# Patient Record
Sex: Male | Born: 1955 | Race: Black or African American | Hispanic: No | Smoking: Never smoker
Health system: Southern US, Community
[De-identification: ages and names within clinical notes are randomized; demographics above are authoritative.]

## PROBLEM LIST (undated history)

## (undated) DIAGNOSIS — I10 Essential (primary) hypertension: Secondary | ICD-10-CM

## (undated) DIAGNOSIS — E785 Hyperlipidemia, unspecified: Secondary | ICD-10-CM

## (undated) DIAGNOSIS — M1990 Unspecified osteoarthritis, unspecified site: Secondary | ICD-10-CM

## (undated) DIAGNOSIS — M109 Gout, unspecified: Secondary | ICD-10-CM

## (undated) DIAGNOSIS — K635 Polyp of colon: Secondary | ICD-10-CM

## (undated) HISTORY — DX: Hyperlipidemia, unspecified: E78.5

## (undated) HISTORY — PX: COLONOSCOPY: SHX174

## (undated) HISTORY — DX: Gout, unspecified: M10.9

## (undated) HISTORY — PX: WISDOM TOOTH EXTRACTION: SHX21

## (undated) HISTORY — DX: Essential (primary) hypertension: I10

## (undated) HISTORY — DX: Polyp of colon: K63.5

## (undated) HISTORY — DX: Unspecified osteoarthritis, unspecified site: M19.90

---

## 1998-08-02 HISTORY — PX: FRACTURE SURGERY: SHX138

## 2002-09-20 ENCOUNTER — Ambulatory Visit (HOSPITAL_BASED_OUTPATIENT_CLINIC_OR_DEPARTMENT_OTHER): Admission: RE | Admit: 2002-09-20 | Discharge: 2002-09-20 | Payer: Self-pay | Admitting: Orthopedic Surgery

## 2008-01-22 ENCOUNTER — Ambulatory Visit: Payer: Self-pay | Admitting: Gastroenterology

## 2008-02-05 ENCOUNTER — Ambulatory Visit: Payer: Self-pay | Admitting: Gastroenterology

## 2008-02-05 ENCOUNTER — Telehealth (INDEPENDENT_AMBULATORY_CARE_PROVIDER_SITE_OTHER): Payer: Self-pay | Admitting: *Deleted

## 2010-12-18 NOTE — Op Note (Signed)
[name], Daniel Davis NO.:  [id number]   MEDICAL RECORD NO.:  [id number]                   PATIENT TYPE:  AMB   LOCATION:  DSC                                  FACILITY:  [facility]   PHYSICIAN:  Leonides Grills, M.D.                  DATE OF BIRTH:  05-15-1956   DATE OF PROCEDURE:  09/20/2002  DATE OF DISCHARGE:                                 OPERATIVE REPORT   PREOPERATIVE DIAGNOSIS:  Left lateral malleolus fracture.   POSTOPERATIVE DIAGNOSIS:  Left lateral malleolus fracture.   PROCEDURE:  Open reduction and internal fixation of left _________  equivalent lateral malleolus fracture.   ANESTHESIA:  General endotracheal tube.   SURGEON:  Leonides Grills, M.D.   ASSISTANT:  Lianne Cure, P.A.   ESTIMATED BLOOD LOSS:  Minimal.   TOURNIQUET TIME:  Approximately one-half hour.   COMPLICATIONS:  None.   DISPOSITION:  Stable to PAR.   INDICATIONS:  This is a 55 year old male who slipped and fell and sustained  the above injury.  He was consented for the above procedure.  All risks,  which include infection, nerve or vessel injury, nonunion, malunion,  hardware rotation, hardware failure, stiffness, arthritis, were all  explained and questions encouraged and answered.   DESCRIPTION OF PROCEDURE:  The patient was brought to the operating room and  placed in the supine position after adequate general endotracheal tube  anesthesia was administered as well as Ancef 1 g IV piggyback.  The left  lower extremity was then prepped and draped in a sterile manner over a  proximally-placed thigh tourniquet with a bump placed under the ipsilateral  hip.  The limb was gravity-exsanguinated.  The tourniquet was elevated to  290 mmHg and a longitudinal incision over the lateral malleolus was then  made.  Dissection was carried down __________ the fracture site was  encountered.  Soft tissue was then removed from the fracture site.  A five-  hole, one-third tubular  plate was then chosen.  The fracture was then  reduced and the plate was applied in the anti-glide manner.  Set screws were  then placed proximal to the fracture site through the plate, and these were  3.5 mm fully-threaded cortical screws using a 2.5 mm drill.  The most distal  set screw was then placed through the plate into the distal fragment and a  lag screw was then placed across the second to the most distal hole across  the fracture site.  All screws had excellent purchase.  Fracture was  maintained in an anatomic position.  X-rays were then obtained in AP,  lateral, and mortise views, and this showed an anatomic reduction with  reduction of the talus and the ankle mortise.  The tourniquet was deflated, hemostasis was maintained.  The wound was  copiously irrigated with normal saline.  The subcu was closed with 3-0  Vicryl, the skin was closed with 4-0 nylon.  A sterile dressing was applied.  A modified Jones dressing was applied with the ankle in neutral  dorsiflexion.  The patient was stable to the PAR.                                                 Leonides Grills, M.D.    PB/MEDQ  D:  09/20/2002  T:  09/20/2002  Job:  [id number]

## 2011-11-02 ENCOUNTER — Other Ambulatory Visit: Payer: Self-pay | Admitting: Internal Medicine

## 2011-11-27 ENCOUNTER — Ambulatory Visit (INDEPENDENT_AMBULATORY_CARE_PROVIDER_SITE_OTHER): Payer: BC Managed Care – PPO | Admitting: Internal Medicine

## 2011-11-27 ENCOUNTER — Ambulatory Visit: Payer: BC Managed Care – PPO

## 2011-11-27 VITALS — BP 126/84 | HR 83 | Temp 98.5°F | Resp 16 | Ht 70.58 in | Wt 222.2 lb

## 2011-11-27 DIAGNOSIS — M25569 Pain in unspecified knee: Secondary | ICD-10-CM

## 2011-11-27 DIAGNOSIS — I10 Essential (primary) hypertension: Secondary | ICD-10-CM

## 2011-11-27 DIAGNOSIS — R609 Edema, unspecified: Secondary | ICD-10-CM

## 2011-11-27 DIAGNOSIS — M109 Gout, unspecified: Secondary | ICD-10-CM

## 2011-11-27 DIAGNOSIS — E789 Disorder of lipoprotein metabolism, unspecified: Secondary | ICD-10-CM

## 2011-11-27 LAB — POCT CBC
Granulocyte percent: 60.6 %G (ref 37–80)
MID (cbc): 0.6 (ref 0–0.9)
MPV: 8.5 fL (ref 0–99.8)
POC Granulocyte: 5 (ref 2–6.9)
POC MID %: 7.5 %M (ref 0–12)
Platelet Count, POC: 347 10*3/uL (ref 142–424)
RBC: 5.1 M/uL (ref 4.69–6.13)

## 2011-11-27 LAB — COMPREHENSIVE METABOLIC PANEL
ALT: 21 U/L (ref 0–53)
Albumin: 4.7 g/dL (ref 3.5–5.2)
Alkaline Phosphatase: 61 U/L (ref 39–117)
Potassium: 4.7 mEq/L (ref 3.5–5.3)
Sodium: 134 mEq/L — ABNORMAL LOW (ref 135–145)
Total Bilirubin: 0.3 mg/dL (ref 0.3–1.2)
Total Protein: 7.4 g/dL (ref 6.0–8.3)

## 2011-11-27 MED ORDER — IBUPROFEN 800 MG PO TABS: 800.0000 mg | ORAL_TABLET | Freq: Three times a day (TID) | ORAL | Status: AC | PRN

## 2011-11-27 NOTE — Patient Instructions (Signed)
Gout Gout is an inflammatory condition (arthritis) caused by a buildup of uric acid crystals in the joints. Uric acid is a chemical that is normally present in the blood. Under some circumstances, uric acid can form into crystals in your joints. This causes joint redness, soreness, and swelling (inflammation). Repeat attacks are common. Over time, uric acid crystals can form into masses (tophi) near a joint, causing disfigurement. Gout is treatable and often preventable. CAUSES  The disease begins with elevated levels of uric acid in the blood. Uric acid is produced by your body when it breaks down a naturally found substance called purines. This also happens when you eat certain foods such as meats and fish. Causes of an elevated uric acid level include:  Being passed down from parent to child (heredity).   Diseases that cause increased uric acid production (obesity, psoriasis, some cancers).   Excessive alcohol use.   Diet, especially diets rich in meat and seafood.   Medicines, including certain cancer-fighting drugs (chemotherapy), diuretics, and aspirin.   Chronic kidney disease. The kidneys are no longer able to remove uric acid well.   Problems with metabolism.  Conditions strongly associated with gout include:  Obesity.   High blood pressure.   High cholesterol.   Diabetes.  Not everyone with elevated uric acid levels gets gout. It is not understood why some people get gout and others do not. Surgery, joint injury, and eating too much of certain foods are some of the factors that can lead to gout. SYMPTOMS   An attack of gout comes on quickly. It causes intense pain with redness, swelling, and warmth in a joint.   Fever can occur.   Often, only one joint is involved. Certain joints are more commonly involved:   Base of the big toe.   Knee.   Ankle.   Wrist.   Finger.  Without treatment, an attack usually goes away in a few days to weeks. Between attacks, you  usually will not have symptoms, which is different from many other forms of arthritis. DIAGNOSIS  Your caregiver will suspect gout based on your symptoms and exam. Removal of fluid from the joint (arthrocentesis) is done to check for uric acid crystals. Your caregiver will give you a medicine that numbs the area (local anesthetic) and use a needle to remove joint fluid for exam. Gout is confirmed when uric acid crystals are seen in joint fluid, using a special microscope. Sometimes, blood, urine, and X-ray tests are also used. TREATMENT  There are 2 phases to gout treatment: treating the sudden onset (acute) attack and preventing attacks (prophylaxis). Treatment of an Acute Attack  Medicines are used. These include anti-inflammatory medicines or steroid medicines.   An injection of steroid medicine into the affected joint is sometimes necessary.   The painful joint is rested. Movement can worsen the arthritis.   You may use warm or cold treatments on painful joints, depending which works best for you.   Discuss the use of coffee, vitamin C, or cherries with your caregiver. These may be helpful treatment options.  Treatment to Prevent Attacks After the acute attack subsides, your caregiver may advise prophylactic medicine. These medicines either help your kidneys eliminate uric acid from your body or decrease your uric acid production. You may need to stay on these medicines for a very long time. The early phase of treatment with prophylactic medicine can be associated with an increase in acute gout attacks. For this reason, during the first few months   of treatment, your caregiver may also advise you to take medicines usually used for acute gout treatment. Be sure you understand your caregiver's directions. You should also discuss dietary treatment with your caregiver. Certain foods such as meats and fish can increase uric acid levels. Other foods such as dairy can decrease levels. Your caregiver  can give you a list of foods to avoid. HOME CARE INSTRUCTIONS   Do not take aspirin to relieve pain. This raises uric acid levels.   Only take over-the-counter or prescription medicines for pain, discomfort, or fever as directed by your caregiver.   Rest the joint as much as possible. When in bed, keep sheets and blankets off painful areas.   Keep the affected joint raised (elevated).   Use crutches if the painful joint is in your leg.   Drink enough water and fluids to keep your urine clear or pale yellow. This helps your body get rid of uric acid. Do not drink alcoholic beverages. They slow the passage of uric acid.   Follow your caregiver's dietary instructions. Pay careful attention to the amount of protein you eat. Your daily diet should emphasize fruits, vegetables, whole grains, and fat-free or low-fat milk products.   Maintain a healthy body weight.  SEEK MEDICAL CARE IF:   You have an oral temperature above 102 F (38.9 C).   You develop diarrhea, vomiting, or any side effects from medicines.   You do not feel better in 24 hours, or you are getting worse.  SEEK IMMEDIATE MEDICAL CARE IF:   Your joint becomes suddenly more tender and you have:   Chills.   An oral temperature above 102 F (38.9 C), not controlled by medicine.  MAKE SURE YOU:   Understand these instructions.   Will watch your condition.   Will get help right away if you are not doing well or get worse.  Document Released: 07/16/2000 Document Revised: 07/08/2011 Document Reviewed: 10/27/2009 [employer] Patient Information 2012 [employer]. 

## 2011-11-27 NOTE — Progress Notes (Signed)
  Subjective:    Patient ID: Daniel Davis, male    DOB: 1955/08/07, 56 y.o.   MRN: [id number]  HPI Has acute R knee pain for few days and thinks its gout. Taking htn and lipid meds. Feels good working and playing ball, knee swelled and later started hurting.   Review of Systems     Objective:   Physical Exam R knee XR [facility] reading (PRIMARY) by  Dr. Perrin Maltese Normal xr Knee mildly tender with effusion and warmth but no reddness . Feels good otherwise.         Assessment & Plan:  Probable gout  Motrin 800mg  till well Hinged knee brace

## 2011-12-02 ENCOUNTER — Other Ambulatory Visit: Payer: Self-pay | Admitting: Internal Medicine

## 2012-02-14 ENCOUNTER — Encounter: Payer: Self-pay | Admitting: Internal Medicine

## 2012-02-28 ENCOUNTER — Encounter: Payer: Self-pay | Admitting: Internal Medicine

## 2012-02-28 ENCOUNTER — Ambulatory Visit (INDEPENDENT_AMBULATORY_CARE_PROVIDER_SITE_OTHER): Payer: BC Managed Care – PPO | Admitting: Internal Medicine

## 2012-02-28 VITALS — BP 132/90 | HR 83 | Temp 99.0°F | Resp 16 | Ht 70.0 in | Wt 221.0 lb

## 2012-02-28 DIAGNOSIS — E782 Mixed hyperlipidemia: Secondary | ICD-10-CM

## 2012-02-28 DIAGNOSIS — Z201 Contact with and (suspected) exposure to tuberculosis: Secondary | ICD-10-CM

## 2012-02-28 DIAGNOSIS — I10 Essential (primary) hypertension: Secondary | ICD-10-CM

## 2012-02-28 DIAGNOSIS — M1990 Unspecified osteoarthritis, unspecified site: Secondary | ICD-10-CM

## 2012-02-28 DIAGNOSIS — Z0000 Encounter for general adult medical examination without abnormal findings: Secondary | ICD-10-CM

## 2012-02-28 LAB — COMPREHENSIVE METABOLIC PANEL
ALT: 16 U/L (ref 0–53)
AST: 20 U/L (ref 0–37)
Albumin: 4.1 g/dL (ref 3.5–5.2)
BUN: 18 mg/dL (ref 6–23)
CO2: 23 mEq/L (ref 19–32)
Calcium: 9.8 mg/dL (ref 8.4–10.5)
Chloride: 101 mEq/L (ref 96–112)
Potassium: 4.7 mEq/L (ref 3.5–5.3)

## 2012-02-28 LAB — CBC WITH DIFFERENTIAL/PLATELET
Basophils Absolute: 0 10*3/uL (ref 0.0–0.1)
HCT: 37.9 % — ABNORMAL LOW (ref 39.0–52.0)
Hemoglobin: 13.1 g/dL (ref 13.0–17.0)
Lymphocytes Relative: 28 % (ref 12–46)
Lymphs Abs: 1.6 10*3/uL (ref 0.7–4.0)
Monocytes Absolute: 0.5 10*3/uL (ref 0.1–1.0)
Monocytes Relative: 9 % (ref 3–12)
Neutro Abs: 3.5 10*3/uL (ref 1.7–7.7)
RBC: 4.83 MIL/uL (ref 4.22–5.81)
RDW: 14.2 % (ref 11.5–15.5)
WBC: 5.8 10*3/uL (ref 4.0–10.5)

## 2012-02-28 LAB — POCT URINALYSIS DIPSTICK
Glucose, UA: NEGATIVE
Ketones, UA: NEGATIVE
Spec Grav, UA: >=1.03

## 2012-02-28 LAB — POCT UA - MICROSCOPIC ONLY: Bacteria, U Microscopic: NEGATIVE

## 2012-02-28 LAB — TSH: TSH: 0.938 u[IU]/mL (ref 0.350–4.500)

## 2012-02-28 LAB — LIPID PANEL: HDL: 36 mg/dL — ABNORMAL LOW (ref >39)

## 2012-02-28 LAB — IFOBT (OCCULT BLOOD): IFOBT: POSITIVE

## 2012-02-28 LAB — PSA: PSA: 1.49 ng/mL (ref <=4.00)

## 2012-02-28 NOTE — Progress Notes (Signed)
  Subjective:    Patient ID: Daniel Davis, male    DOB: 01-16-1956, 56 y.o.   MRN: [id number]  HPI Taking care of disabled mother and his own family. HTN, lipids controlled. Feels good  See scanned hx  Review of Systems See scanned ros    Objective:   Physical Exam  Constitutional: He is oriented to person, place, and time. He appears well-developed and well-nourished.  HENT:  Right Ear: External ear normal.  Left Ear: External ear normal.  Nose: Nose normal.  Mouth/Throat: Oropharynx is clear and moist.  Eyes: EOM are normal. Pupils are equal, round, and reactive to light.  Neck: Normal range of motion. No tracheal deviation present. No thyromegaly present.  Cardiovascular: Normal rate, regular rhythm and normal heart sounds.   Pulmonary/Chest: Effort normal and breath sounds normal.  Abdominal: Soft. He exhibits no mass. There is no tenderness.  Genitourinary: Rectum normal and prostate normal. No penile tenderness.  Musculoskeletal: Normal range of motion. He exhibits edema and tenderness.  Lymphadenopathy:    He has no cervical adenopathy.  Neurological: He is alert and oriented to person, place, and time. He has normal reflexes. He exhibits normal muscle tone. Coordination normal.  Skin: Skin is warm and dry.  Psychiatric: He has a normal mood and affect.     ekg nl     Assessment & Plan:  RF meds 1 yr Doing well

## 2012-03-02 ENCOUNTER — Encounter: Payer: Self-pay | Admitting: Radiology

## 2012-03-03 ENCOUNTER — Other Ambulatory Visit: Payer: Self-pay | Admitting: Internal Medicine

## 2012-04-11 ENCOUNTER — Other Ambulatory Visit: Payer: Self-pay | Admitting: Internal Medicine

## 2012-05-02 ENCOUNTER — Ambulatory Visit (INDEPENDENT_AMBULATORY_CARE_PROVIDER_SITE_OTHER): Payer: BC Managed Care – PPO | Admitting: Internal Medicine

## 2012-05-02 VITALS — BP 124/76 | HR 91 | Temp 98.0°F | Resp 17 | Ht 70.5 in | Wt 222.0 lb

## 2012-05-02 DIAGNOSIS — R229 Localized swelling, mass and lump, unspecified: Secondary | ICD-10-CM

## 2012-05-02 DIAGNOSIS — R2230 Localized swelling, mass and lump, unspecified upper limb: Secondary | ICD-10-CM

## 2012-05-02 NOTE — Progress Notes (Signed)
  Subjective:    Patient ID: Daniel Davis., male    DOB: Feb 05, 1956, 56 y.o.   MRN: [id number]  HPI New lump appeared on proximal volar left forearm in last 1-2 weeks. No pain, no color change, no nms status change. He has not had an injury. Has no associated sxs.   Review of Systems     Objective:   Physical Exam Left forearm has smooth, adherent,cystic lump Has full nmsv function       Assessment & Plan:  CT forearm this week

## 2012-05-03 ENCOUNTER — Telehealth: Payer: Self-pay | Admitting: Radiology

## 2012-05-03 DIAGNOSIS — R2230 Localized swelling, mass and lump, unspecified upper limb: Secondary | ICD-10-CM

## 2012-05-03 NOTE — Telephone Encounter (Signed)
Dr Perrin Maltese, I got call from [facility] to change CT scan to CT with contrast. I did this for them, then got another call to change to MRI scan with contrast, but I want you to authorize this first. There is not a contraindication, patient has no metal, however this study is a lot more costly, and I want to make sure it is appropriate. Please advise if you want CT with contrast or if you would in fact like to change order to MRI with contrast. Laurelyn Terrero

## 2012-05-03 NOTE — Telephone Encounter (Signed)
[facility] Donnamarie Poag, has advised this needs to be with contrast due to findings of mass in forearm to r/o neoplasm. Order is corrected.

## 2012-05-04 ENCOUNTER — Telehealth: Payer: Self-pay | Admitting: Radiology

## 2012-05-05 ENCOUNTER — Telehealth: Payer: Self-pay | Admitting: Radiology

## 2012-05-05 NOTE — Telephone Encounter (Signed)
Dr Perrin Maltese is asking about the CT scan for patient, I have spoken to [facility] and he is sch for Oct 8th at 3pm. FYI

## 2012-05-05 NOTE — Telephone Encounter (Signed)
Patient needs CT scan, not MRI as per Dr  Perrin Maltese. I have advised our referral dept CT with contrast is fine for this patient, MRI is not indicated at this time, to proceed with referral as per his order.

## 2012-05-09 ENCOUNTER — Telehealth: Payer: Self-pay | Admitting: Radiology

## 2012-05-09 ENCOUNTER — Ambulatory Visit
Admission: RE | Admit: 2012-05-09 | Discharge: 2012-05-09 | Disposition: A | Discharge status: Home / Self Care | Payer: BC Managed Care – PPO | Source: Ambulatory Visit | Attending: Internal Medicine | Admitting: Internal Medicine

## 2012-05-09 DIAGNOSIS — R2230 Localized swelling, mass and lump, unspecified upper limb: Secondary | ICD-10-CM

## 2012-05-09 MED ORDER — IOHEXOL 300 MG/ML  SOLN
100.0000 mL | Freq: Once | INTRAMUSCULAR | Status: AC | PRN
  Administered 2012-05-09: 100 mL via INTRAVENOUS

## 2012-05-11 NOTE — Telephone Encounter (Signed)
Have spoken to [facility] imaging Dr Perrin Maltese does want CT not MRI tech [place] Mole was advised. CT should be limited to forearm.

## 2012-05-15 ENCOUNTER — Telehealth: Payer: Self-pay

## 2012-05-15 NOTE — Telephone Encounter (Signed)
Pt is waiting for his mri results  Please call [phone]

## 2012-05-17 ENCOUNTER — Telehealth: Payer: Self-pay

## 2012-05-17 NOTE — Telephone Encounter (Signed)
Pt would like to know if his results have come in, he said this is his second call. [phone]

## 2012-05-17 NOTE — Telephone Encounter (Signed)
Called patient, Dr Perrin Maltese mailed copy to him, he is advised this does not need any further treatment.

## 2012-06-10 ENCOUNTER — Other Ambulatory Visit: Payer: Self-pay | Admitting: Physician Assistant

## 2012-08-01 ENCOUNTER — Ambulatory Visit: Payer: BC Managed Care – PPO

## 2012-08-01 ENCOUNTER — Ambulatory Visit (INDEPENDENT_AMBULATORY_CARE_PROVIDER_SITE_OTHER): Payer: BC Managed Care – PPO | Admitting: Family Medicine

## 2012-08-01 VITALS — BP 121/75 | HR 86 | Temp 98.5°F | Resp 16 | Ht 71.0 in | Wt 231.2 lb

## 2012-08-01 DIAGNOSIS — M25469 Effusion, unspecified knee: Secondary | ICD-10-CM

## 2012-08-01 DIAGNOSIS — M25569 Pain in unspecified knee: Secondary | ICD-10-CM

## 2012-08-01 DIAGNOSIS — M1990 Unspecified osteoarthritis, unspecified site: Secondary | ICD-10-CM

## 2012-08-01 DIAGNOSIS — M129 Arthropathy, unspecified: Secondary | ICD-10-CM

## 2012-08-01 MED ORDER — MELOXICAM 7.5 MG PO TABS: 7.5000 mg | ORAL_TABLET | Freq: Every day | ORAL | Status: DC

## 2012-08-01 NOTE — Patient Instructions (Signed)
Wear ace bandage, keep elevated as able, relative rest.  mobic once per day if needed. Recheck in next week if not improved. Return to the clinic or go to the nearest emergency room if any of your symptoms worsen or new symptoms occur. Your should receive a call or letter about your lab results within the next week to 10 days.

## 2012-08-01 NOTE — Progress Notes (Signed)
Patient seen and examined with Porfirio Oar, PA-C.  Agree with her findings. R knee 2-3 + effusion, no erythema, lacks approx 30degrees flex, 10-15 degrees ext. Min lateral jt line ttp.  Negative varus/valgus stress. Min lateral discomfort with mcmurray.  Negative lachman. XR reviewed - min djd medial, PF joint.  Hx of gout vs pseudogout. Likely flare vs oa/degen meniscal tear.  Options discussed  Would like to have knee drained/injected.  Risks (including but not limited to bleeding and infection), benefits, and alternatives discussed for R knee injection .  Verbal consent obtained after any questions were answered. Landmarks noted, and marked as needed. Area cleansed with Betadine x3, ethyl chloride spray for topical anesthesia, followed by alcohol swab. 100cc yellow - slightly cloudy fluid withdrawn. No steroid injection given cloudy fluid.  No complications. Bandage and ace bandage applied.  RTC precautions discussed.  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 Will send fluid for analysis, trial of Mobic 7.5mg  qd prn, relative rest/elevation, and recheck in next week if not improving.

## 2012-08-01 NOTE — Progress Notes (Signed)
Subjective:    Patient ID: Daniel Davis., male    DOB: Sep 08, 1955, 56 y.o.   MRN: [id number]  HPI This 56 y.o. male presents for evaluation of RIGHT knee pain.  He has a history of gout and believes that this is a flare of that and asks if he can have a steroid injection.  The knee feels tight.  The pain began 2 days ago, without specific trauma or injury, but he was doing a lot of walking in [place] h[place] area while [place]. No fever, chills.  No weakness or giving way, but sometimes a "catching."   Past Medical History  Diagnosis Date  . Arthritis   . Gout attack   . Hyperlipidemia   . Hypertension     Past Surgical History  Procedure Date  . Fracture surgery     LEFT ankle, ORIF    Prior to Admission medications   Medication Sig Start Date End Date Taking? Authorizing Provider  atorvastatin (LIPITOR) 20 MG tablet take 1 tablet by mouth at bedtime 04/11/12  Yes Jonita Albee, MD  lisinopril (PRINIVIL,ZESTRIL) 20 MG tablet take 1 tablet by mouth once daily 06/10/12  Yes Nelva Nay, PA-C    No Known Allergies  History   Social History  . Marital Status: Married    Spouse Name: Nelma Rothman    Number of Children: 1  . Years of Education: 14+   Occupational History  . SHIFT SUPERVISOR    Social History Main Topics  . Smoking status: Never Smoker   . Smokeless tobacco: Never Used  . Alcohol Use: 16.0 oz/week    32 drink(s) per week  . Drug Use: No  . Sexually Active: Yes -- Male partner(s)   Other Topics Concern  . Not on file   Social History Narrative   Lives with his wife and their daughter.    Family History  Problem Relation Age of Onset  . Diabetes Mother   . Cancer Mother 49    colon  . Hypertension Father   . Heart disease Father     Review of Systems As above.    Objective:   Physical Exam  Vitals reviewed. Constitutional: He is oriented to person, place, and time. Vital signs are normal. He appears well-developed and well-nourished. He is  active and cooperative. No distress.  HENT:  Head: Normocephalic and atraumatic.  Eyes: Conjunctivae normal are normal. No scleral icterus.  Cardiovascular: Normal rate.   Pulmonary/Chest: Effort normal.  Musculoskeletal:       Right knee: He exhibits effusion. He exhibits normal range of motion, no swelling, no ecchymosis, no deformity, no laceration, no erythema, normal alignment, no LCL laxity, normal patellar mobility, no bony tenderness, normal meniscus and no MCL laxity. tenderness found. Medial joint line (mild) tenderness noted. No lateral joint line, no MCL, no LCL and no patellar tendon tenderness noted.  Neurological: He is alert and oriented to person, place, and time.  Skin: Skin is warm and dry.  Psychiatric: He has a normal mood and affect. His behavior is normal.     RIGHT Knee: [facility] reading (PRIMARY) by  Dr. Neva Seat.  Mild medial joint space degenerative changes.  Mild patello-femoral degenerative changes.  No acute abnormalities.       Assessment & Plan:   1. Knee pain  DG Knee Complete 4 Views Right  2. Effusion of knee  DG Knee Complete 4 Views Right  3. Inflammatory arthropathy     See Procedure by Dr.  Neva Seat.

## 2012-08-02 LAB — SYNOVIAL CELL COUNT + DIFF, W/ CRYSTALS
Eosinophils-Synovial: 0 % (ref 0–1)
Lymphocytes-Synovial Fld: 1 % (ref 0–20)
Monocyte/Macrophage: 15 % — ABNORMAL LOW (ref 50–90)
WBC, Synovial: 8600 cu mm — ABNORMAL HIGH (ref 0–200)

## 2012-09-04 ENCOUNTER — Ambulatory Visit: Payer: BC Managed Care – PPO | Admitting: Internal Medicine

## 2012-10-15 ENCOUNTER — Other Ambulatory Visit: Payer: Self-pay | Admitting: Physician Assistant

## 2012-10-28 ENCOUNTER — Ambulatory Visit: Payer: BC Managed Care – PPO

## 2012-10-28 ENCOUNTER — Ambulatory Visit (INDEPENDENT_AMBULATORY_CARE_PROVIDER_SITE_OTHER): Payer: BC Managed Care – PPO | Admitting: Emergency Medicine

## 2012-10-28 VITALS — BP 137/90 | HR 80 | Temp 98.9°F | Resp 18 | Wt 227.0 lb

## 2012-10-28 DIAGNOSIS — M25441 Effusion, right hand: Secondary | ICD-10-CM

## 2012-10-28 DIAGNOSIS — M25561 Pain in right knee: Secondary | ICD-10-CM

## 2012-10-28 DIAGNOSIS — M7989 Other specified soft tissue disorders: Secondary | ICD-10-CM

## 2012-10-28 DIAGNOSIS — M109 Gout, unspecified: Secondary | ICD-10-CM

## 2012-10-28 DIAGNOSIS — M25469 Effusion, unspecified knee: Secondary | ICD-10-CM

## 2012-10-28 DIAGNOSIS — M25461 Effusion, right knee: Secondary | ICD-10-CM

## 2012-10-28 LAB — POCT CBC
HCT, POC: 44.3 % (ref 43.5–53.7)
Hemoglobin: 14.5 g/dL (ref 14.1–18.1)
Lymph, poc: 1.7 (ref 0.6–3.4)
MCHC: 32.7 g/dL (ref 31.8–35.4)
MPV: 9.1 fL (ref 0–99.8)
POC Granulocyte: 3.2 (ref 2–6.9)
RBC: 5.35 M/uL (ref 4.69–6.13)

## 2012-10-28 LAB — URIC ACID: Uric Acid, Serum: 8.6 mg/dL — ABNORMAL HIGH (ref 4.0–7.8)

## 2012-10-28 MED ORDER — PREDNISONE 10 MG PO TABS: ORAL_TABLET | ORAL | Status: DC

## 2012-10-28 NOTE — Patient Instructions (Addendum)
Gout Gout is an inflammatory condition (arthritis) caused by a buildup of uric acid crystals in the joints. Uric acid is a chemical that is normally present in the blood. Under some circumstances, uric acid can form into crystals in your joints. This causes joint redness, soreness, and swelling (inflammation). Repeat attacks are common. Over time, uric acid crystals can form into masses (tophi) near a joint, causing disfigurement. Gout is treatable and often preventable. CAUSES  The disease begins with elevated levels of uric acid in the blood. Uric acid is produced by your body when it breaks down a naturally found substance called purines. This also happens when you eat certain foods such as meats and fish. Causes of an elevated uric acid level include:  Being passed down from parent to child (heredity).  Diseases that cause increased uric acid production (obesity, psoriasis, some cancers).  Excessive alcohol use.  Diet, especially diets rich in meat and seafood.  Medicines, including certain cancer-fighting drugs (chemotherapy), diuretics, and aspirin.  Chronic kidney disease. The kidneys are no longer able to remove uric acid well.  Problems with metabolism. Conditions strongly associated with gout include:  Obesity.  High blood pressure.  High cholesterol.  Diabetes. Not everyone with elevated uric acid levels gets gout. It is not understood why some people get gout and others do not. Surgery, joint injury, and eating too much of certain foods are some of the factors that can lead to gout. SYMPTOMS   An attack of gout comes on quickly. It causes intense pain with redness, swelling, and warmth in a joint.  Fever can occur.  Often, only one joint is involved. Certain joints are more commonly involved:  Base of the big toe.  Knee.  Ankle.  Wrist.  Finger. Without treatment, an attack usually goes away in a few days to weeks. Between attacks, you usually will not have  symptoms, which is different from many other forms of arthritis. DIAGNOSIS  Your caregiver will suspect gout based on your symptoms and exam. Removal of fluid from the joint (arthrocentesis) is done to check for uric acid crystals. Your caregiver will give you a medicine that numbs the area (local anesthetic) and use a needle to remove joint fluid for exam. Gout is confirmed when uric acid crystals are seen in joint fluid, using a special microscope. Sometimes, blood, urine, and X-ray tests are also used. TREATMENT  There are 2 phases to gout treatment: treating the sudden onset (acute) attack and preventing attacks (prophylaxis). Treatment of an Acute Attack  Medicines are used. These include anti-inflammatory medicines or steroid medicines.  An injection of steroid medicine into the affected joint is sometimes necessary.  The painful joint is rested. Movement can worsen the arthritis.  You may use warm or cold treatments on painful joints, depending which works best for you.  Discuss the use of coffee, vitamin C, or cherries with your caregiver. These may be helpful treatment options. Treatment to Prevent Attacks After the acute attack subsides, your caregiver may advise prophylactic medicine. These medicines either help your kidneys eliminate uric acid from your body or decrease your uric acid production. You may need to stay on these medicines for a very long time. The early phase of treatment with prophylactic medicine can be associated with an increase in acute gout attacks. For this reason, during the first few months of treatment, your caregiver may also advise you to take medicines usually used for acute gout treatment. Be sure you understand your caregiver's directions.   You should also discuss dietary treatment with your caregiver. Certain foods such as meats and fish can increase uric acid levels. Other foods such as dairy can decrease levels. Your caregiver can give you a list of foods  to avoid. HOME CARE INSTRUCTIONS   Do not take aspirin to relieve pain. This raises uric acid levels.  Only take over-the-counter or prescription medicines for pain, discomfort, or fever as directed by your caregiver.  Rest the joint as much as possible. When in bed, keep sheets and blankets off painful areas.  Keep the affected joint raised (elevated).  Use crutches if the painful joint is in your leg.  Drink enough water and fluids to keep your urine clear or pale yellow. This helps your body get rid of uric acid. Do not drink alcoholic beverages. They slow the passage of uric acid.  Follow your caregiver's dietary instructions. Pay careful attention to the amount of protein you eat. Your daily diet should emphasize fruits, vegetables, whole grains, and fat-free or low-fat milk products.  Maintain a healthy body weight. SEEK MEDICAL CARE IF:   You have an oral temperature above 102 F (38.9 C).  You develop diarrhea, vomiting, or any side effects from medicines.  You do not feel better in 24 hours, or you are getting worse. SEEK IMMEDIATE MEDICAL CARE IF:   Your joint becomes suddenly more tender and you have:  Chills.  An oral temperature above 102 F (38.9 C), not controlled by medicine. MAKE SURE YOU:   Understand these instructions.  Will watch your condition.  Will get help right away if you are not doing well or get worse. Document Released: 07/16/2000 Document Revised: 10/11/2011 Document Reviewed: 10/27/2009 [employer] Patient Information 2013 [employer].    

## 2012-10-28 NOTE — Progress Notes (Signed)
  Subjective:    Patient ID: Daniel Greathouse., male    DOB: July 11, 1956, 57 y.o.   MRN: [id number]  HPI enters with pain in his right knee. He history of gout and has had an aspiration of his knee in the recent past. His last uric acid was elevated. He denies any repeat trauma to his knee he also right ring finger which occurred when he fell and injured his finger.has swelling of his    Review of Systems     Objective:   Physical Exam there is swelling present over the right knee there is some pain with McMurray testing no significant effusion is present. There is a negative anterior drawer sign. There is significant swelling over the PIP joint of the right ring finger with inability to reach full extension. There is no instability of the joint.   [facility] reading (PRIMARY) by  Dr. Cleta Alberts no fracture seen of the fourth finger  Results for orders placed in visit on 10/28/12  POCT CBC      Result Value Range   WBC 5.3  4.6 - 10.2 K/uL   Lymph, poc 1.7  0.6 - 3.4   POC LYMPH PERCENT 32.4  10 - 50 %L   MID (cbc) 0.4  0 - 0.9   POC MID % 7.6  0 - 12 %M   POC Granulocyte 3.2  2 - 6.9   Granulocyte percent 60.0  37 - 80 %G   RBC 5.35  4.69 - 6.13 M/uL   Hemoglobin 14.5  14.1 - 18.1 g/dL   HCT, POC 82.9  56.2 - 53.7 %   MCV 82.8  80 - 97 fL   MCH, POC 27.1  27 - 31.2 pg   MCHC 32.7  31.8 - 35.4 g/dL   RDW, POC 13.0     Platelet Count, POC 244  142 - 424 K/uL   MPV 9.1  0 - 99.8 fL         Assessment & Plan:  Patient has a traumatic injury to his right ring finger. I suspect the trouble in his knees related to gout we'll treat with a prednisone taper for gout. He was also given a handout regarding triggers for gout.Marland Kitchen

## 2012-11-03 ENCOUNTER — Other Ambulatory Visit: Payer: Self-pay | Admitting: Radiology

## 2012-11-03 DIAGNOSIS — IMO0002 Reserved for concepts with insufficient information to code with codable children: Secondary | ICD-10-CM

## 2012-11-22 ENCOUNTER — Telehealth: Payer: Self-pay

## 2012-11-22 NOTE — Telephone Encounter (Signed)
Request given to xray 

## 2012-11-22 NOTE — Telephone Encounter (Signed)
Pt is needing copy of his X-ray disk for when he goes to orthopedic Dr. If someone could call him and let him know when they are ready. Call back number is [phone]

## 2012-11-28 ENCOUNTER — Ambulatory Visit (INDEPENDENT_AMBULATORY_CARE_PROVIDER_SITE_OTHER): Payer: BC Managed Care – PPO | Admitting: Internal Medicine

## 2012-11-28 VITALS — BP 120/82 | HR 78 | Temp 97.9°F | Resp 16 | Ht 70.5 in | Wt 226.0 lb

## 2012-11-28 DIAGNOSIS — M25561 Pain in right knee: Secondary | ICD-10-CM

## 2012-11-28 DIAGNOSIS — M25569 Pain in unspecified knee: Secondary | ICD-10-CM

## 2012-11-28 MED ORDER — METHYLPREDNISOLONE ACETATE 80 MG/ML IJ SUSP
120.0000 mg | Freq: Once | INTRAMUSCULAR | Status: AC
  Administered 2012-11-28: 120 mg via INTRAMUSCULAR

## 2012-11-28 MED ORDER — PREDNISONE 10 MG PO TABS: ORAL_TABLET | ORAL | Status: DC

## 2012-11-28 NOTE — Progress Notes (Signed)
  Subjective:    Patient ID: Daniel Davis., male    DOB: 1955/11/30, 57 y.o.   MRN: [id number]  HPI Right knee pain after full court basketball, mild swelling and pain with minimal function loss. No warmth or reddness, not gout. XR from 4/13 no djd noted   Review of Systems     Objective:   Physical Exam Right knee mildly weaker than left knee, no effusion, red, or warmth NMSV intact       Assessment & Plan:  Pain knee Depomedrol IM/ Prednisone 6 day taper pc po Knee rehab

## 2012-11-28 NOTE — Patient Instructions (Signed)
Meniscus Tear with Phase I Rehab The meniscus is a C-shaped cartilage structure, located in the knee joint between the thigh bone (femur) and the shinbone (tibia). Two menisci are located in each knee joint: the inner and outer meniscus. The meniscus acts as an adapter between the thigh bone and shinbone, allowing them to fit properly together. It also functions as a shock absorber, to reduce the stress placed on the knee joint and to help supply nutrients to the knee joint cartilage. As people age, the meniscus begins to harden and become more vulnerable to injury. Meniscus tears are a common injury, especially in older athletes. Inner meniscus tears are more common than outer meniscus tears.  SYMPTOMS   Pain in the knee, especially with standing or squatting with the affected leg.  Tenderness along the joint line.  Swelling in the knee joint (effusion), usually starting 1 to 2 days after injury.  Locking or catching of the knee joint, causing inability to straighten the knee completely.  Giving way or buckling of the knee. CAUSES  A meniscus tear occurs when a force is placed on the meniscus that is greater than it can handle. Common causes of injury include:  Direct hit (trauma) to the knee.  Twisting, pivoting, or cutting (rapidly changing direction while running), kneeling or squatting.  Without injury, due to aging. RISK INCREASES WITH:  Contact sports (football, rugby).  Sports in which cleats are used with pivoting (soccer, lacrosse) or sports in which good shoe grip and sudden change in direction are required (racquetball, basketball, squash).  Previous knee injury.  Associated knee injury, particularly ligament injuries.  Poor strength and flexibility. PREVENTION  Warm up and stretch properly before activity.  Maintain physical fitness:  Strength, flexibility, and endurance.  Cardiovascular fitness.  Protect the knee with a brace or elastic bandage.  Wear  properly fitted protective equipment (proper cleats for the surface). PROGNOSIS  Sometimes, meniscus tears heal on their own. However, definitive treatment requires surgery, followed by at least 6 weeks of recovery.  RELATED COMPLICATIONS   Recurring symptoms that result in a chronic problem.  Repeated knee injury, especially if sports are resumed too soon after injury or surgery.  Progression of the tear (the tear gets larger), if untreated.  Arthritis of the knee in later years (with or without surgery).  Complications of surgery, including infection, bleeding, injury to nerves (numbness, weakness, paralysis) continued pain, giving way, locking, nonhealing of meniscus (if repaired), need for further surgery, and knee stiffness (loss of motion). TREATMENT  Treatment first involves the use of ice and medicine, to reduce pain and inflammation. You may find using crutches to walk more comfortable. However, it is okay to bear weight on the injured knee, if the pain will allow it. Surgery is often advised as a definitive treatment. Surgery is performed through an incision near the joint (arthroscopically). The torn piece of the meniscus is removed, and if possible the joint cartilage is repaired. After surgery, the joint must be restrained. After restraint, it is important to perform strengthening and stretching exercises to help regain strength and a full range of motion. These exercises may be completed at home or with a therapist.  MEDICATION  If pain medicine is needed, nonsteroidal anti-inflammatory medicines (aspirin and ibuprofen), or other minor pain relievers (acetaminophen), are often advised.  Do not take pain medicine for 7 days before surgery.  Prescription pain relievers may be given, if your caregiver thinks they are needed. Use only as directed and   only as much as you need. HEAT AND COLD  Cold treatment (icing) should be applied for 10 to 15 minutes every 2 to 3 hours for  inflammation and pain, and immediately after activity that aggravates your symptoms. Use ice packs or an ice massage.  Heat treatment may be used before performing stretching and strengthening activities prescribed by your caregiver, physical therapist, or athletic trainer. Use a heat pack or a warm water soak. SEEK MEDICAL CARE IF:   Symptoms get worse or do not improve in 2 weeks, despite treatment.  New, unexplained symptoms develop. (Drugs used in treatment may produce side effects.) EXERCISES RANGE OF MOTION (ROM) AND STRETCHING EXERCISES - Meniscus Tear, Non-operative, Phase I These are some of the initial exercises with which you may start your rehabilitation program, until you see your caregiver again or until your symptoms are resolved. Remember:   These initial exercises are intended to be gentle. They will help you restore motion without increasing any swelling.  Completing these exercises allows less painful movement and prepares you for the more aggressive strengthening exercises in Phase II.  An effective stretch should be held for at least 30 seconds.  A stretch should never be painful. You should only feel a gentle lengthening or release in the stretched tissue. RANGE OF MOTION - Knee Flexion, Active  Lie on your back with both knees straight. (If this causes back discomfort, bend your healthy knee, placing your foot flat on the floor.)  Slowly slide your heel back toward your buttocks until you feel a gentle stretch in the front of your knee or thigh.  Hold for __________ seconds. Slowly slide your heel back to the starting position. Repeat __________ times. Complete this exercise __________ times per day.  RANGE OF MOTION - Knee Flexion and Extension, Active-Assisted  Sit on the edge of a table or chair with your thighs firmly supported. It may be helpful to place a folded towel under the end of your right / left thigh.  Flexion (bending): Place the ankle of your  healthy leg on top of the other ankle. Use your healthy leg to gently bend your right / left knee until you feel a mild tension across the top of your knee.  Hold for __________ seconds.  Extension (straightening): Switch your ankles so your right / left leg is on top. Use your healthy leg to straighten your right / left knee until you feel a mild tension on the backside of your knee.  Hold for __________ seconds. Repeat __________ times. Complete __________ times per day. STRETCH - Knee Flexion, Supine  Lie on the floor with your right / left heel and foot lightly touching the wall. (Place both feet on the wall if you do not use a door frame.)  Without using any effort, allow gravity to slide your foot down the wall slowly until you feel a gentle stretch in the front of your right / left knee.  Hold this stretch for __________ seconds. Then return the leg to the starting position, using your healthy leg for help, if needed. Repeat __________ times. Complete this stretch __________ times per day.  STRETCH - Knee Extension Sitting  Sit with your right / left leg/heel propped on another chair, coffee table, or foot stool.  Allow your leg muscles to relax, letting gravity straighten out your knee.*  You should feel a stretch behind your right / left knee. Hold this position for __________ seconds. Repeat __________ times. Complete this stretch __________   times per day.  *Your physician, physical therapist or athletic trainer may instruct you place a __________ weight on your thigh, just above your kneecap, to deepen the stretch.  STRENGTHENING EXERCISES - Meniscus Tear, Non-operative, Phase I These exercises may help you when beginning to rehabilitate your injury. They may resolve your symptoms with or without further involvement from your physician, physical therapist or athletic trainer. While completing these exercises, remember:   Muscles can gain both the endurance and the strength  needed for everyday activities through controlled exercises.  Complete these exercises as instructed by your physician, physical therapist or athletic trainer. Progress the resistance and repetitions only as guided. STRENGTH - Quadriceps, Isometrics  Lie on your back with your right / left leg extended and your opposite knee bent.  Gradually tense the muscles in the front of your right / left thigh. You should see either your knee cap slide up toward your hip or increased dimpling just above the knee. This motion will push the back of the knee down toward the floor, mat, or bed on which you are lying.  Hold the muscle as tight as you can, without increasing your pain, for __________ seconds.  Relax the muscles slowly and completely between each repetition. Repeat __________ times. Complete this exercise __________ times per day.  STRENGTH - Quadriceps, Short Arcs   Lie on your back. Place a __________ inch towel roll under your right / left knee, so that the knee bends slightly.  Raise only your lower leg by tightening the muscles in the front of your thigh. Do not allow your thigh to rise.  Hold this position for __________ seconds. Repeat __________ times. Complete this exercise __________ times per day.  OPTIONAL ANKLE WEIGHTS: Begin with ____________________, but DO NOT exceed ____________________. Increase in 1 pound/0.5 kilogram increments. STRENGTH - Quadriceps, Straight Leg Raises  Quality counts! Watch for signs that the quadriceps muscle is working, to be sure you are strengthening the correct muscles and not "cheating" by substituting with healthier muscles.  Lay on your back with your right / left leg extended and your opposite knee bent.  Tense the muscles in the front of your right / left thigh. You should see either your knee cap slide up or increased dimpling just above the knee. Your thigh may even shake a bit.  Tighten these muscles even more and raise your leg 4 to 6  inches off the floor. Hold for __________ seconds.  Keeping these muscles tense, lower your leg.  Relax the muscles slowly and completely in between each repetition. Repeat __________ times. Complete this exercise __________ times per day.  STRENGTH - Hamstring, Curls   Lay on your stomach with your legs extended. (If you lay on a bed, your feet may hang over the edge.)  Tighten the muscles in the back of your thigh to bend your right / left knee up to 90 degrees. Keep your hips flat on the bed.  Hold this position for __________ seconds.  Slowly lower your leg back to the starting position. Repeat __________ times. Complete this exercise __________ times per day.  STRENGTH  Quadriceps, Squats  Stand in a door frame so that your feet and knees are in line with the frame.  Use your hands for balance, not support, on the frame.  Slowly lower your weight, bending at the hips and knees. Keep your lower legs upright so that they are parallel with the door frame. Squat only within the range that does   not increase your knee pain. Never let your hips drop below your knees.  Slowly return upright, pushing with your legs, not pulling with your hands. Repeat __________ times. Complete this exercise __________ times per day.  STRENGTH - Quad/VMO, Isometric   Sit in a chair with your right / left knee slightly bent. With your fingertips, feel the VMO muscle just above the inside of your knee. The VMO is important in controlling the position of your kneecap.  Keeping your fingertips on this muscle. Without actually moving your leg, attempt to drive your knee down as if straightening your leg. You should feel your VMO tense. If you have a difficult time, you may wish to try the same exercise on your healthy knee first.  Tense this muscle as hard as you can without increasing any knee pain.  Hold for __________ seconds. Relax the muscles slowly and completely in between each repetition. Repeat  __________ times. Complete exercise __________ times per day.  Document Released: 08/02/1998 Document Revised: 10/11/2011 Document Reviewed: 10/31/2008 [employer] Patient Information 2013 [employer].  

## 2013-03-11 ENCOUNTER — Other Ambulatory Visit: Payer: Self-pay | Admitting: Physician Assistant

## 2013-05-08 ENCOUNTER — Other Ambulatory Visit: Payer: Self-pay | Admitting: Internal Medicine

## 2013-06-07 ENCOUNTER — Ambulatory Visit (INDEPENDENT_AMBULATORY_CARE_PROVIDER_SITE_OTHER): Payer: BC Managed Care – PPO | Admitting: Physician Assistant

## 2013-06-07 VITALS — BP 128/88 | HR 74 | Temp 98.4°F | Resp 16 | Ht 71.0 in | Wt 220.6 lb

## 2013-06-07 DIAGNOSIS — M25529 Pain in unspecified elbow: Secondary | ICD-10-CM

## 2013-06-07 DIAGNOSIS — M25521 Pain in right elbow: Secondary | ICD-10-CM

## 2013-06-07 MED ORDER — INDOMETHACIN 50 MG PO CAPS: 50.0000 mg | ORAL_CAPSULE | Freq: Three times a day (TID) | ORAL | Status: DC | PRN

## 2013-06-07 NOTE — Progress Notes (Signed)
   [address], [place] [address]   Phone [phone]   Subjective:    Patient ID: Daniel Davis., male    DOB: 1955-10-18, 57 y.o.   MRN: [id number]  HPI  Pt presents to clinic with R elbow pain for the last 3 days that started without an injury.  He has had gout before and this feels like that.  He has tried to give it time to get better but he has not used any OTC meds for his pain.  It hurts while sleeping because things rub up against it.   Review of Systems  Musculoskeletal: Positive for joint swelling.       Objective:   Physical Exam  Constitutional: He is oriented to person, place, and time. He appears well-developed and well-nourished.  HENT:  Head: Normocephalic and atraumatic.  Right Ear: External ear normal.  Left Ear: External ear normal.  Eyes: Conjunctivae are normal.  Neck: Normal range of motion.  Pulmonary/Chest: Effort normal.  Musculoskeletal:       Right elbow: He exhibits decreased range of motion and swelling.       Arms: Neurological: He is alert and oriented to person, place, and time.  Skin: Skin is warm and dry.  Psychiatric: He has a normal mood and affect. His behavior is normal. Judgment and thought content normal.       Assessment & Plan:  Elbow joint pain, right - Plan: indomethacin (INDOCIN) 50 MG capsule - this is most likely Gout - he has had it before and this feels like the past episodes.  He had an elevated uric acid in 3/14.  He gets about 2 gout attacks a year.  Benny Lennert PA-C 06/07/2013 5:52 PM

## 2013-06-09 ENCOUNTER — Other Ambulatory Visit: Payer: Self-pay | Admitting: Internal Medicine

## 2013-06-18 ENCOUNTER — Other Ambulatory Visit: Payer: Self-pay | Admitting: Physician Assistant

## 2013-06-18 NOTE — Telephone Encounter (Signed)
Needs OV and labs - last here for acute elbow pain only

## 2013-08-08 ENCOUNTER — Other Ambulatory Visit: Payer: Self-pay | Admitting: Physician Assistant

## 2013-08-09 ENCOUNTER — Telehealth: Payer: Self-pay

## 2013-08-09 MED ORDER — LISINOPRIL 20 MG PO TABS: 20.0000 mg | ORAL_TABLET | Freq: Every day | ORAL | Status: DC

## 2013-08-09 NOTE — Telephone Encounter (Signed)
Advised will only send 30 day supply. Pt aware to make appt. Transferred him to billing to make the appt.

## 2013-08-09 NOTE — Telephone Encounter (Signed)
Needs refill on lisinopril. Cb# [id number]. [facility] on [address].

## 2013-09-03 ENCOUNTER — Encounter: Payer: Self-pay | Admitting: Family Medicine

## 2013-09-03 ENCOUNTER — Ambulatory Visit (INDEPENDENT_AMBULATORY_CARE_PROVIDER_SITE_OTHER): Payer: BC Managed Care – PPO | Admitting: Family Medicine

## 2013-09-03 VITALS — BP 126/86 | HR 76 | Temp 98.2°F | Resp 16 | Ht 71.0 in | Wt 228.2 lb

## 2013-09-03 DIAGNOSIS — M25441 Effusion, right hand: Secondary | ICD-10-CM

## 2013-09-03 DIAGNOSIS — E785 Hyperlipidemia, unspecified: Secondary | ICD-10-CM

## 2013-09-03 DIAGNOSIS — I10 Essential (primary) hypertension: Secondary | ICD-10-CM

## 2013-09-03 DIAGNOSIS — M109 Gout, unspecified: Secondary | ICD-10-CM

## 2013-09-03 DIAGNOSIS — M7989 Other specified soft tissue disorders: Secondary | ICD-10-CM

## 2013-09-03 LAB — LIPID PANEL
CHOL/HDL RATIO: 5.3 ratio
CHOLESTEROL: 229 mg/dL — AB (ref 0–200)
HDL: 43 mg/dL (ref >39)
LDL Cholesterol: 135 mg/dL — ABNORMAL HIGH (ref 0–99)
Triglycerides: 255 mg/dL — ABNORMAL HIGH (ref <150)
VLDL: 51 mg/dL — ABNORMAL HIGH (ref 0–40)

## 2013-09-03 LAB — COMPLETE METABOLIC PANEL WITH GFR
ALK PHOS: 57 U/L (ref 39–117)
ALT: 19 U/L (ref 0–53)
AST: 22 U/L (ref 0–37)
Albumin: 4.4 g/dL (ref 3.5–5.2)
BILIRUBIN TOTAL: 0.4 mg/dL (ref 0.2–1.2)
BUN: 14 mg/dL (ref 6–23)
CALCIUM: 9.6 mg/dL (ref 8.4–10.5)
CO2: 27 mEq/L (ref 19–32)
Chloride: 101 mEq/L (ref 96–112)
Creat: 1.02 mg/dL (ref 0.50–1.35)
GFR, Est African American: >89 mL/min
GFR, Est Non African American: 82 mL/min
Glucose, Bld: 86 mg/dL (ref 70–99)
Potassium: 4.3 mEq/L (ref 3.5–5.3)
Sodium: 136 mEq/L (ref 135–145)
Total Protein: 7.1 g/dL (ref 6.0–8.3)

## 2013-09-03 LAB — URIC ACID: Uric Acid, Serum: 9.2 mg/dL — ABNORMAL HIGH (ref 4.0–7.8)

## 2013-09-03 MED ORDER — ATORVASTATIN CALCIUM 20 MG PO TABS: ORAL_TABLET | ORAL | Status: DC

## 2013-09-03 MED ORDER — LISINOPRIL 20 MG PO TABS: 20.0000 mg | ORAL_TABLET | Freq: Every day | ORAL | Status: DC

## 2013-09-03 NOTE — Patient Instructions (Signed)
As you are not fasting, and off cholesterol medicines, your cholesterol test will likely be elevated. Can restart cholesterol medication but need to recheck labs in two to three months. Can try to do this at a physical. We will call to schedule this. Let me know if you would like to see a physical therapist or ortho for your finger. We will check a gout test and can discuss this further at your next office visit. Return to the clinic or go to the nearest emergency room if any of your symptoms worsen or new symptoms occur.

## 2013-09-03 NOTE — Progress Notes (Signed)
Subjective:    Patient ID: Daniel Davis., male    DOB: [birth year], 58 y.o.   MRN: [id number]  This chart was scribed for Daniel Agreste, MD by Maree Erie, ED Scribe.  Chief Complaint  Patient presents with  . Medication Refill    with labs    PCP: GUEST, [name], MD   HPI  [name]. is a 58 y.o. male who presents to office for medication refill.    Hypertension: last creatinine 1.12 in July, 2013.   Hyperlipidemia: Last lipid in July 2013 with LDL 85, HDL 36, Triglycerides 392, Total cholesterol 199. Takes Lipitor 20 mg QHS. He has been out of his Lipitor for a few months. He states that he tolerates the medication well and denies any side effects. Patient is not currently fasting- he ate celery, cucumber and carrots with ranch a few hours ago.   Gout: history of gout with elevated uric acid of 8.6 on March 29,2014 when seen for knee pain. Noted swelling to the right ring finger PIP joint at that visit but status post fall then. 11/6 office visit, right elbow pain and swelling. Suspected gout flare treated with Incocin, 50 mg. Reported two gout attacks per year at that time. He states that he has had one gout attack in his elbow again since the last attack in his elbow.   Finger Swelling, Right Ring PIP: Swelling to right ring finger PIP joint as noted in Gout paragraph above. He states his finger ROM is improved to normal but has been swollen for at least seven months. He was seen by Orthopedics, who told him that it would take awhile for the swelling to completely subside. He denies any significant pain. He denies chest pain, lightheaded ness dizziness, blood in stool, abdominal pain, vomiting, headaches.    Patient Active Problem List   Diagnosis Date Noted  . OA (osteoarthritis) 02/28/2012  . HTN (hypertension) 11/27/2011  . Lipid disorder 11/27/2011  . Gout attack 11/27/2011   Past Medical History  Diagnosis Date  . Arthritis   . Gout attack   .  Hyperlipidemia   . Hypertension    Past Surgical History  Procedure Laterality Date  . Fracture surgery      LEFT ankle, ORIF   No Known Allergies Prior to Admission medications   Medication Sig Start Date End Date Taking? Authorizing Provider  atorvastatin (LIPITOR) 20 MG tablet take 1 tablet by mouth at bedtime 04/11/12  Yes Orma Flaming, MD  indomethacin (INDOCIN) 50 MG capsule Take 1 capsule (50 mg total) by mouth 3 (three) times daily with meals as needed for mild pain (gout). 06/07/13  Yes Mancel Bale, PA-C  lisinopril (PRINIVIL,ZESTRIL) 20 MG tablet Take 1 tablet (20 mg total) by mouth daily. NEED VISIT, LABS!! 08/09/13  Yes Rise Mu, PA-C   History   Social History  . Marital Status: Married    Spouse Name: [name]    Number of Children: 1  . Years of Education: 14+   Occupational History  . SHIFT SUPERVISOR    Social History Main Topics  . Smoking status: Never Smoker   . Smokeless tobacco: Never Used  . Alcohol Use: 16.0 oz/week    32 drink(s) per week  . Drug Use: No  . Sexual Activity: Yes    Partners: Female   Other Topics Concern  . Not on file   Social History Narrative   Lives with his wife and their daughter.  Review of Systems  Constitutional: Negative for fatigue and unexpected weight change.  Eyes: Negative for visual disturbance.  Respiratory: Negative for cough, chest tightness and shortness of breath.   Cardiovascular: Negative for chest pain, palpitations and leg swelling.  Gastrointestinal: Negative for abdominal pain and blood in stool.  Musculoskeletal: Positive for joint swelling.  Neurological: Negative for dizziness, light-headedness and headaches.       Objective:   Physical Exam  Vitals reviewed. Constitutional: He is oriented to person, place, and time. He appears well-developed and well-nourished.  HENT:  Head: Normocephalic and atraumatic.  Eyes: EOM are normal. Pupils are equal, round, and reactive to light.  Neck: No  JVD present. Carotid bruit is not present.  Cardiovascular: Normal rate, regular rhythm and normal heart sounds.   No murmur heard. Pulmonary/Chest: Effort normal and breath sounds normal. He has no rales.  Musculoskeletal: He exhibits no edema.  Full resistance of extension of right ring finger. PIP held in slight flexion at 10-15 degrees but equal flexion as the other hand. Full flexion strength of right ring finger. Full flexion and extension of PIP and DIP joints.  Neurological: He is alert and oriented to person, place, and time.  Skin: Skin is warm and dry.  Psychiatric: He has a normal mood and affect.    Filed Vitals:   09/03/13 1440  BP: 126/86  Pulse: 76  Temp: 98.2 F (36.8 C)  TempSrc: Oral  Resp: 16       Assessment & Plan:   [name]. is a 58 y.o. male Swelling of finger joint of right hand  -suspected "jammed finger" initially, but ddx of central slip extensor tendon injury with slight flex of PIP. Full function, but offered ortho or PT eval - declined at present.   Gout - Plan: Uric Acid. Discuss control next ov.   HTN (hypertension) - Plan: COMPLETE METABOLIC PANEL WITH GFR, lisinopril (PRINIVIL,ZESTRIL) 20 MG tablet refilled   Other and unspecified hyperlipidemia - Plan: COMPLETE METABOLIC PANEL WITH GFR, Lipid panel, atorvastatin (LIPITOR) 20 MG tablet refilled.  May need repeat lipids in next 2-3 months as nonadherent recently.   Plan on CPE next few months.    Meds ordered this encounter  Medications  . lisinopril (PRINIVIL,ZESTRIL) 20 MG tablet    Sig: Take 1 tablet (20 mg total) by mouth daily.    Dispense:  90 tablet    Refill:  1  . atorvastatin (LIPITOR) 20 MG tablet    Sig: take 1 tablet by mouth at bedtime    Dispense:  90 tablet    Refill:  1   Patient Instructions  As you are not fasting, and off cholesterol medicines, your cholesterol test will likely be elevated. Can restart cholesterol medication but need to recheck labs in two  to three months. Can try to do this at a physical. We will call to schedule this. Let me know if you would like to see a physical therapist or ortho for your finger. We will check a gout test and can discuss this further at your next office visit. Return to the clinic or go to the nearest emergency room if any of your symptoms worsen or new symptoms occur.     I personally performed the services described in this documentation, which was scribed in my presence. The recorded information has been reviewed and considered, and addended by me as needed.

## 2013-09-12 NOTE — Progress Notes (Signed)
Appointment for physical with Dr Carlota Raspberry made for 11/26/13.

## 2013-11-03 ENCOUNTER — Other Ambulatory Visit: Payer: Self-pay | Admitting: Physician Assistant

## 2013-11-26 ENCOUNTER — Ambulatory Visit (INDEPENDENT_AMBULATORY_CARE_PROVIDER_SITE_OTHER): Payer: BC Managed Care – PPO | Admitting: Family Medicine

## 2013-11-26 ENCOUNTER — Encounter: Payer: Self-pay | Admitting: Family Medicine

## 2013-11-26 VITALS — BP 110/80 | HR 91 | Temp 99.1°F | Resp 16 | Ht 71.0 in | Wt 229.8 lb

## 2013-11-26 DIAGNOSIS — Z125 Encounter for screening for malignant neoplasm of prostate: Secondary | ICD-10-CM

## 2013-11-26 DIAGNOSIS — I10 Essential (primary) hypertension: Secondary | ICD-10-CM

## 2013-11-26 DIAGNOSIS — Z0000 Encounter for general adult medical examination without abnormal findings: Secondary | ICD-10-CM

## 2013-11-26 DIAGNOSIS — Z1211 Encounter for screening for malignant neoplasm of colon: Secondary | ICD-10-CM

## 2013-11-26 DIAGNOSIS — M109 Gout, unspecified: Secondary | ICD-10-CM

## 2013-11-26 DIAGNOSIS — E782 Mixed hyperlipidemia: Secondary | ICD-10-CM

## 2013-11-26 LAB — COMPLETE METABOLIC PANEL WITH GFR
ALBUMIN: 4.6 g/dL (ref 3.5–5.2)
ALK PHOS: 56 U/L (ref 39–117)
ALT: 27 U/L (ref 0–53)
AST: 26 U/L (ref 0–37)
BILIRUBIN TOTAL: 0.5 mg/dL (ref 0.2–1.2)
BUN: 18 mg/dL (ref 6–23)
CO2: 21 mEq/L (ref 19–32)
Calcium: 10.1 mg/dL (ref 8.4–10.5)
Chloride: 102 mEq/L (ref 96–112)
Creat: 0.96 mg/dL (ref 0.50–1.35)
GFR, Est African American: >89 mL/min
GFR, Est Non African American: 89 mL/min
Glucose, Bld: 94 mg/dL (ref 70–99)
POTASSIUM: 4.3 meq/L (ref 3.5–5.3)
Sodium: 134 mEq/L — ABNORMAL LOW (ref 135–145)
TOTAL PROTEIN: 7.6 g/dL (ref 6.0–8.3)

## 2013-11-26 LAB — POCT URINALYSIS DIPSTICK
Bilirubin, UA: NEGATIVE
Glucose, UA: NEGATIVE
KETONES UA: NEGATIVE
Leukocytes, UA: NEGATIVE
NITRITE UA: NEGATIVE
PH UA: 5.5
Protein, UA: NEGATIVE
Spec Grav, UA: 1.025
Urobilinogen, UA: 0.2

## 2013-11-26 LAB — CBC WITH DIFFERENTIAL/PLATELET
BASOS ABS: 0 10*3/uL (ref 0.0–0.1)
BASOS PCT: 0 % (ref 0–1)
EOS ABS: 0.1 10*3/uL (ref 0.0–0.7)
Eosinophils Relative: 1 % (ref 0–5)
HEMATOCRIT: 42.3 % (ref 39.0–52.0)
Hemoglobin: 15 g/dL (ref 13.0–17.0)
Lymphocytes Relative: 33 % (ref 12–46)
Lymphs Abs: 2.7 10*3/uL (ref 0.7–4.0)
MCH: 26.8 pg (ref 26.0–34.0)
MCHC: 35.5 g/dL (ref 30.0–36.0)
MCV: 75.5 fL — ABNORMAL LOW (ref 78.0–100.0)
MONO ABS: 0.7 10*3/uL (ref 0.1–1.0)
MONOS PCT: 8 % (ref 3–12)
Neutro Abs: 4.8 10*3/uL (ref 1.7–7.7)
Neutrophils Relative %: 58 % (ref 43–77)
Platelets: 298 10*3/uL (ref 150–400)
RBC: 5.6 MIL/uL (ref 4.22–5.81)
RDW: 14.9 % (ref 11.5–15.5)
WBC: 8.2 10*3/uL (ref 4.0–10.5)

## 2013-11-26 LAB — LIPID PANEL
CHOL/HDL RATIO: 4.1 ratio
Cholesterol: 188 mg/dL (ref 0–200)
HDL: 46 mg/dL (ref >39)
LDL CALC: 79 mg/dL (ref 0–99)
Triglycerides: 317 mg/dL — ABNORMAL HIGH (ref <150)
VLDL: 63 mg/dL — AB (ref 0–40)

## 2013-11-26 LAB — IFOBT (OCCULT BLOOD): IFOBT: NEGATIVE

## 2013-11-26 NOTE — Progress Notes (Signed)
   Subjective:    Patient ID: [name]., male    DOB: September 05, 1955, 58 y.o.   MRN: [id number]  HPI    Review of Systems  Constitutional: Negative.   HENT: Negative.   Eyes: Negative.   Respiratory: Negative.   Cardiovascular: Negative.   Gastrointestinal: Negative.   Endocrine: Negative.   Genitourinary: Negative.   Musculoskeletal: Negative.   Skin: Negative.   Allergic/Immunologic: Negative.   Neurological: Negative.   Hematological: Negative.   Psychiatric/Behavioral: Negative.        Objective:   Physical Exam        Assessment & Plan:

## 2013-11-26 NOTE — Progress Notes (Addendum)
Subjective:  This chart was scribed for Merri Ray, MD by Roxan Diesel, Scribe.  This patient was seen in [facility] and the patient's care was started at 3:55 PM.   Patient ID: Daniel Palms., male    DOB: 10/25/1955, 58 y.o.   MRN: [id number]  HPI  Daniel Jud Fanguy. is a 58 y.o. male PCP: GUEST, [name], MD   Pt presents for an annual exam.  Last seen approximately 2 months ago.  H/o HTN and hyperlipidemia.  He has no specific complaints at this time.  He has been busy with work.  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   Hyperlipidemia: Had been out of his Lipitor for a few months at last visit.  Lipid panel at that visit was elevated, with total cholesterol 229, HDL 43, LDL 135.  He is taking his Lipitor every day currently and reports he rarely misses doses.  He is fasting today.  Hypertension: Last GFR was 82 in February.  On lisinopril 20mg /day.  He denies side effects.  He denies CP, lightheadedness or dizziness.   H/o gout: Uric acid was 9.2 at last office visit.  Up from 8.6 one year ago.  On initial evaluation in November 2014 he was having 2 gout attacks per year.  Did note swelling in right ring finger for at least 7 months.  Seen by ortho for treatment, suspected jammed finger vs central slip extensor tendon injury.  Declined repeat ortho or PT eval last office visit.  Has taken Indocin 50mg  for attacks only.  No suppressive therapy at present.  He states he has continued to have some swelling to that finger but he has full function with the finger and it does not bother him.  He states he had another gout attack to his right knee several weeks ago which resolved after using Indocin for 2-3 days.  He reports he has had 2-3 gout attacks total over the past year.  Physical:  EKG -- Last EKG was in July 2013 - sinus rhythm, borderline 1st degree AV block., with PR interval of 210.  No acute findings otherwise.  Colonoscopy - In July 2009 - noted  diverticulosis, otherwise normal, repeat in 10 years. Prostate cancer screening - normal PSA of 0.88 in September 2011, normal PSA of 1.49 in August 2013.  He denies family h/o prostate cancer.  He denies blood in stool. Tetanus - last Tdap in May 2009.   Exercise - He walks during his lunch break at work and does yard work, but he does not do any other exercise.  He reports he has gained some weight recently.  He does not smoke.   Patient Active Problem List   Diagnosis Date Noted  . OA (osteoarthritis) 02/28/2012  . HTN (hypertension) 11/27/2011  . Lipid disorder 11/27/2011  . Gout attack 11/27/2011    Past Medical History  Diagnosis Date  . Arthritis   . Gout attack   . Hyperlipidemia   . Hypertension     Past Surgical History  Procedure Laterality Date  . Fracture surgery      LEFT ankle, ORIF    No Known Allergies  Prior to Admission medications   Medication Sig Start Date End Date Taking? Authorizing Provider  atorvastatin (LIPITOR) 20 MG tablet take 1 tablet by mouth at bedtime 09/03/13  Yes [name], MD  indomethacin (INDOCIN) 50 MG capsule take 1 capsule by mouth three times a day with  food for MILD PAIN(GOUT)   Yes Mancel Bale, PA-C  lisinopril (PRINIVIL,ZESTRIL) 20 MG tablet Take 1 tablet (20 mg total) by mouth daily. 09/03/13  Yes [name], MD    History   Social History  . Marital Status: Married    Spouse Name: [name]    Number of Children: 1  . Years of Education: 14+   Occupational History  . SHIFT SUPERVISOR    Social History Main Topics  . Smoking status: Never Smoker   . Smokeless tobacco: Never Used  . Alcohol Use: 16.0 oz/week    32 drink(s) per week     Comment: 3-5 drinks  . Drug Use: No  . Sexual Activity: Yes    Partners: Female   Other Topics Concern  . Not on file   Social History Narrative   Lives with his wife and their daughter.     Review of Systems  Constitutional: Negative.   HENT: Negative.   Eyes:  Negative.   Respiratory: Negative.   Cardiovascular: Negative.   Gastrointestinal: Negative.   Endocrine: Negative.   Genitourinary: Negative.   Musculoskeletal: Negative.   Skin: Negative.   Allergic/Immunologic: Negative.   Neurological: Negative.   Hematological: Negative.   Psychiatric/Behavioral: Negative.   13-point review of systems per nursing note reviewed, and negative other than above.      Objective:   Physical Exam  Vitals reviewed. Constitutional: He is oriented to person, place, and time. He appears well-developed and well-nourished.  HENT:  Head: Normocephalic and atraumatic.  Right Ear: External ear normal.  Left Ear: External ear normal.  Mouth/Throat: Oropharynx is clear and moist.  Eyes: Conjunctivae and EOM are normal. Pupils are equal, round, and reactive to light.  Neck: Normal range of motion. Neck supple. No JVD present. Carotid bruit is not present. No thyromegaly present.  Cardiovascular: Normal rate, regular rhythm, normal heart sounds and intact distal pulses.   No murmur heard. Pulmonary/Chest: Effort normal and breath sounds normal. No respiratory distress. He has no wheezes. He has no rales.  Abdominal: Soft. He exhibits no distension. There is no tenderness. Hernia confirmed negative in the right inguinal area and confirmed negative in the left inguinal area.  Genitourinary: Prostate normal.  Few external anal skin tags  Musculoskeletal: He exhibits no edema and no tenderness.  R 4t PIPI held in min flexion., nt , full flex and ext strength.   Lymphadenopathy:    He has no cervical adenopathy.  Neurological: He is alert and oriented to person, place, and time. He has normal reflexes.  Skin: Skin is warm and dry.  Psychiatric: He has a normal mood and affect. His behavior is normal.     Filed Vitals:   11/26/13 1517  BP: 110/80  Pulse: 91  Temp: 99.1 F (37.3 C)  TempSrc: Oral  Resp: 16  Height: 5\' 11"  (1.803 m)  Weight: 229 lb 12.8  oz (104.237 kg)  SpO2: 96%    Visual Acuity Screening   Right eye Left eye Both eyes  Without correction: 20/20 20/20 20/20   With correction:      EKG: sr, borderline 1st degree AV block, no acute findings or apparent changes from prior.   .this    Assessment & Plan:  Daniel Mckinney. is a 58 y.o. male Essential hypertension, benign - Plan: EKG 12-Lead, TSH, Lipid panel, COMPLETE METABOLIC PANEL WITH GFR - stable. No med changes.   Routine general medical examination at a health care facility -  Plan: EKG 12-Lead, IFOBT POC (occult bld, rslt in office), POCT urinalysis dipstick, CBC with Differential, PSA, TSH, Lipid panel, COMPLETE METABOLIC PANEL WITH GFR  - anticipatory guidance below. ;labs above. Up to date on colonoscopy and hemosure negative.   Mixed hyperlipidemia - Plan: TSH, Lipid panel - continue lipitor at same dose at present.   Gout - Plan: discussed daily med/urate lowering therapy, such as allopurinol, but [facility] handout given on gout for him to read to further discuss options. Has indomethacin if needed for acute flair.   Prostate cancer screening - Plan: IFOBT POC (occult bld, rslt in office), POCT urinalysis dipstick, CBC with Differential, PSA - slight increase in level when last checked, but still in normal range.  Screening options discussed including not screening, but he would like to have screening. PSA pending.    No orders of the defined types were placed in this encounter.   Patient Instructions  See the handout on gout.  Would likely benefit from daily medicine for gout - read through info and we can discuss this further. Indomethacin temporarily if flair.   You should receive a call or letter about your lab results within the next week to 10 days.   Return to the clinic or go to the nearest emergency room if any of your symptoms worsen or new symptoms occur.  No changes in meds at this point.    Keeping you healthy  Get these tests  Blood  pressure- Have your blood pressure checked once a year by your healthcare provider.  Normal blood pressure is 120/80  Weight- Have your body mass index (BMI) calculated to screen for obesity.  BMI is a measure of body fat based on height and weight. You can also calculate your own BMI at [employer].  Cholesterol- Have your cholesterol checked every year.  Diabetes- Have your blood sugar checked regularly if you have high blood pressure, high cholesterol, have a family history of diabetes or if you are overweight.  Screening for Colon Cancer- Colonoscopy starting at age 70.  Screening may begin sooner depending on your family history and other health conditions. Follow up colonoscopy as directed by your Gastroenterologist.  Screening for Prostate Cancer- Both blood work (PSA) and a rectal exam help screen for Prostate Cancer.  Screening begins at age 66 with African-American men and at age 60 with Caucasian men.  Screening may begin sooner depending on your family history.  Take these medicines  Aspirin- One aspirin daily can help prevent Heart disease and Stroke.  Flu shot- Every fall.  Tetanus- Every 10 years.  Zostavax- Once after the age of 41 to prevent Shingles.  Pneumonia shot- Once after the age of 7; if you are younger than 85, ask your healthcare provider if you need a Pneumonia shot.  Take these steps  Don't smoke- If you do smoke, talk to your doctor about quitting.  For tips on how to quit, go to www.smokefree.gov or call 1-800-QUIT-NOW.  Be physically active- Exercise 5 days a week for at least 30 minutes.  If you are not already physically active start slow and gradually work up to 30 minutes of moderate physical activity.  Examples of moderate activity include walking briskly, mowing the yard, dancing, swimming, bicycling, etc.  Eat a healthy diet- Eat a variety of healthy food such as fruits, vegetables, low fat milk, low fat cheese, yogurt, lean meant,  poultry, fish, beans, tofu, etc. For more information go to www.thenutritionsource.org  Drink alcohol in moderation-  Limit alcohol intake to less than two drinks a day. Never drink and drive.  Dentist- Brush and floss twice daily; visit your dentist twice a year.  Depression- Your emotional health is as important as your physical health. If you're feeling down, or losing interest in things you would normally enjoy please talk to your healthcare provider.  Eye exam- Visit your eye doctor every year.  Safe sex- If you may be exposed to a sexually transmitted infection, use a condom.  Seat belts- Seat belts can save your life; always wear one.  Smoke/Carbon Monoxide detectors- These detectors need to be installed on the appropriate level of your home.  Replace batteries at least once a year.  Skin cancer- When out in the sun, cover up and use sunscreen 15 SPF or higher.  Violence- If anyone is threatening you, please tell your healthcare provider.  Living Will/ Health care power of attorney- Speak with your healthcare provider and family.  Gout Gout is an inflammatory arthritis caused by a buildup of uric acid crystals in the joints. Uric acid is a chemical that is normally present in the blood. When the level of uric acid in the blood is too high it can form crystals that deposit in your joints and tissues. This causes joint redness, soreness, and swelling (inflammation). Repeat attacks are common. Over time, uric acid crystals can form into masses (tophi) near a joint, destroying bone and causing disfigurement. Gout is treatable and often preventable. CAUSES  The disease begins with elevated levels of uric acid in the blood. Uric acid is produced by your body when it breaks down a naturally found substance called purines. Certain foods you eat, such as meats and fish, contain high amounts of purines. Causes of an elevated uric acid level include:  Being passed down from parent to child  (heredity).  Diseases that cause increased uric acid production (such as obesity, psoriasis, and certain cancers).  Excessive alcohol use.  Diet, especially diets rich in meat and seafood.  Medicines, including certain cancer-fighting medicines (chemotherapy), water pills (diuretics), and aspirin.  Chronic kidney disease. The kidneys are no longer able to remove uric acid well.  Problems with metabolism. Conditions strongly associated with gout include:  Obesity.  High blood pressure.  High cholesterol.  Diabetes. Not everyone with elevated uric acid levels gets gout. It is not understood why some people get gout and others do not. Surgery, joint injury, and eating too much of certain foods are some of the factors that can lead to gout attacks. SYMPTOMS   An attack of gout comes on quickly. It causes intense pain with redness, swelling, and warmth in a joint.  Fever can occur.  Often, only one joint is involved. Certain joints are more commonly involved:  Base of the big toe.  Knee.  Ankle.  Wrist.  Finger. Without treatment, an attack usually goes away in a few days to weeks. Between attacks, you usually will not have symptoms, which is different from many other forms of arthritis. DIAGNOSIS  Your caregiver will suspect gout based on your symptoms and exam. In some cases, tests may be recommended. The tests may include:  Blood tests.  Urine tests.  X-rays.  Joint fluid exam. This exam requires a needle to remove fluid from the joint (arthrocentesis). Using a microscope, gout is confirmed when uric acid crystals are seen in the joint fluid. TREATMENT  There are two phases to gout treatment: treating the sudden onset (acute) attack and preventing attacks (  prophylaxis).  Treatment of an Acute Attack.  Medicines are used. These include anti-inflammatory medicines or steroid medicines.  An injection of steroid medicine into the affected joint is sometimes  necessary.  The painful joint is rested. Movement can worsen the arthritis.  You may use warm or cold treatments on painful joints, depending which works best for you.  Treatment to Prevent Attacks.  If you suffer from frequent gout attacks, your caregiver may advise preventive medicine. These medicines are started after the acute attack subsides. These medicines either help your kidneys eliminate uric acid from your body or decrease your uric acid production. You may need to stay on these medicines for a very long time.  The early phase of treatment with preventive medicine can be associated with an increase in acute gout attacks. For this reason, during the first few months of treatment, your caregiver may also advise you to take medicines usually used for acute gout treatment. Be sure you understand your caregiver's directions. Your caregiver may make several adjustments to your medicine dose before these medicines are effective.  Discuss dietary treatment with your caregiver or dietitian. Alcohol and drinks high in sugar and fructose and foods such as meat, poultry, and seafood can increase uric acid levels. Your caregiver or dietician can advise you on drinks and foods that should be limited. HOME CARE INSTRUCTIONS   Do not take aspirin to relieve pain. This raises uric acid levels.  Only take over-the-counter or prescription medicines for pain, discomfort, or fever as directed by your caregiver.  Rest the joint as much as possible. When in bed, keep sheets and blankets off painful areas.  Keep the affected joint raised (elevated).  Apply warm or cold treatments to painful joints. Use of warm or cold treatments depends on which works best for you.  Use crutches if the painful joint is in your leg.  Drink enough fluids to keep your urine clear or pale yellow. This helps your body get rid of uric acid. Limit alcohol, sugary drinks, and fructose drinks.  Follow your dietary  instructions. Pay careful attention to the amount of protein you eat. Your daily diet should emphasize fruits, vegetables, whole grains, and fat-free or low-fat milk products. Discuss the use of coffee, vitamin C, and cherries with your caregiver or dietician. These may be helpful in lowering uric acid levels.  Maintain a healthy body weight. SEEK MEDICAL CARE IF:   You develop diarrhea, vomiting, or any side effects from medicines.  You do not feel better in 24 hours, or you are getting worse. SEEK IMMEDIATE MEDICAL CARE IF:   Your joint becomes suddenly more tender, and you have chills or a fever. MAKE SURE YOU:   Understand these instructions.  Will watch your condition.  Will get help right away if you are not doing well or get worse. Document Released: 07/16/2000 Document Revised: 11/13/2012 Document Reviewed: 03/01/2012 [facility] Patient Information 2014 [place].       I personally performed the services described in this documentation, which was scribed in my presence. The recorded information has been reviewed and considered, and addended by me as needed.

## 2013-11-26 NOTE — Patient Instructions (Signed)
See the handout on gout.  Would likely benefit from daily medicine for gout - read through info and we can discuss this further. Indomethacin temporarily if flair.   You should receive a call or letter about your lab results within the next week to 10 days.   Return to the clinic or go to the nearest emergency room if any of your symptoms worsen or new symptoms occur.  No changes in meds at this point.    Keeping you healthy  Get these tests  Blood pressure- Have your blood pressure checked once a year by your healthcare provider.  Normal blood pressure is 120/80  Weight- Have your body mass index (BMI) calculated to screen for obesity.  BMI is a measure of body fat based on height and weight. You can also calculate your own BMI at [email or web address].  Cholesterol- Have your cholesterol checked every year.  Diabetes- Have your blood sugar checked regularly if you have high blood pressure, high cholesterol, have a family history of diabetes or if you are overweight.  Screening for Colon Cancer- Colonoscopy starting at age 55.  Screening may begin sooner depending on your family history and other health conditions. Follow up colonoscopy as directed by your Gastroenterologist.  Screening for Prostate Cancer- Both blood work (PSA) and a rectal exam help screen for Prostate Cancer.  Screening begins at age 39 with African-American men and at age 58 with Caucasian men.  Screening may begin sooner depending on your family history.  Take these medicines  Aspirin- One aspirin daily can help prevent Heart disease and Stroke.  Flu shot- Every fall.  Tetanus- Every 10 years.  Zostavax- Once after the age of 71 to prevent Shingles.  Pneumonia shot- Once after the age of 28; if you are younger than 7, ask your healthcare provider if you need a Pneumonia shot.  Take these steps  Don't smoke- If you do smoke, talk to your doctor about quitting.  For tips on how to quit, go to  www.smokefree.gov or call 1-800-QUIT-NOW.  Be physically active- Exercise 5 days a week for at least 30 minutes.  If you are not already physically active start slow and gradually work up to 30 minutes of moderate physical activity.  Examples of moderate activity include walking briskly, mowing the yard, dancing, swimming, bicycling, etc.  Eat a healthy diet- Eat a variety of healthy food such as fruits, vegetables, low fat milk, low fat cheese, yogurt, lean meant, poultry, fish, beans, tofu, etc. For more information go to www.thenutritionsource.org  Drink alcohol in moderation- Limit alcohol intake to less than two drinks a day. Never drink and drive.  Dentist- Brush and floss twice daily; visit your dentist twice a year.  Depression- Your emotional health is as important as your physical health. If you're feeling down, or losing interest in things you would normally enjoy please talk to your healthcare provider.  Eye exam- Visit your eye doctor every year.  Safe sex- If you may be exposed to a sexually transmitted infection, use a condom.  Seat belts- Seat belts can save your life; always wear one.  Smoke/Carbon Monoxide detectors- These detectors need to be installed on the appropriate level of your home.  Replace batteries at least once a year.  Skin cancer- When out in the sun, cover up and use sunscreen 15 SPF or higher.  Violence- If anyone is threatening you, please tell your healthcare provider.  Living Will/ Health care power of attorney- Speak with your healthcare  provider and family.  Gout Gout is an inflammatory arthritis caused by a buildup of uric acid crystals in the joints. Uric acid is a chemical that is normally present in the blood. When the level of uric acid in the blood is too high it can form crystals that deposit in your joints and tissues. This causes joint redness, soreness, and swelling (inflammation). Repeat attacks are common. Over time, uric acid crystals  can form into masses (tophi) near a joint, destroying bone and causing disfigurement. Gout is treatable and often preventable. CAUSES  The disease begins with elevated levels of uric acid in the blood. Uric acid is produced by your body when it breaks down a naturally found substance called purines. Certain foods you eat, such as meats and fish, contain high amounts of purines. Causes of an elevated uric acid level include:  Being passed down from parent to child (heredity).  Diseases that cause increased uric acid production (such as obesity, psoriasis, and certain cancers).  Excessive alcohol use.  Diet, especially diets rich in meat and seafood.  Medicines, including certain cancer-fighting medicines (chemotherapy), water pills (diuretics), and aspirin.  Chronic kidney disease. The kidneys are no longer able to remove uric acid well.  Problems with metabolism. Conditions strongly associated with gout include:  Obesity.  High blood pressure.  High cholesterol.  Diabetes. Not everyone with elevated uric acid levels gets gout. It is not understood why some people get gout and others do not. Surgery, joint injury, and eating too much of certain foods are some of the factors that can lead to gout attacks. SYMPTOMS   An attack of gout comes on quickly. It causes intense pain with redness, swelling, and warmth in a joint.  Fever can occur.  Often, only one joint is involved. Certain joints are more commonly involved:  Base of the big toe.  Knee.  Ankle.  Wrist.  Finger. Without treatment, an attack usually goes away in a few days to weeks. Between attacks, you usually will not have symptoms, which is different from many other forms of arthritis. DIAGNOSIS  Your caregiver will suspect gout based on your symptoms and exam. In some cases, tests may be recommended. The tests may include:  Blood tests.  Urine tests.  X-rays.  Joint fluid exam. This exam requires a needle  to remove fluid from the joint (arthrocentesis). Using a microscope, gout is confirmed when uric acid crystals are seen in the joint fluid. TREATMENT  There are two phases to gout treatment: treating the sudden onset (acute) attack and preventing attacks (prophylaxis).  Treatment of an Acute Attack.  Medicines are used. These include anti-inflammatory medicines or steroid medicines.  An injection of steroid medicine into the affected joint is sometimes necessary.  The painful joint is rested. Movement can worsen the arthritis.  You may use warm or cold treatments on painful joints, depending which works best for you.  Treatment to Prevent Attacks.  If you suffer from frequent gout attacks, your caregiver may advise preventive medicine. These medicines are started after the acute attack subsides. These medicines either help your kidneys eliminate uric acid from your body or decrease your uric acid production. You may need to stay on these medicines for a very long time.  The early phase of treatment with preventive medicine can be associated with an increase in acute gout attacks. For this reason, during the first few months of treatment, your caregiver may also advise you to take medicines usually used for acute  gout treatment. Be sure you understand your caregiver's directions. Your caregiver may make several adjustments to your medicine dose before these medicines are effective.  Discuss dietary treatment with your caregiver or dietitian. Alcohol and drinks high in sugar and fructose and foods such as meat, poultry, and seafood can increase uric acid levels. Your caregiver or dietician can advise you on drinks and foods that should be limited. HOME CARE INSTRUCTIONS   Do not take aspirin to relieve pain. This raises uric acid levels.  Only take over-the-counter or prescription medicines for pain, discomfort, or fever as directed by your caregiver.  Rest the joint as much as possible.  When in bed, keep sheets and blankets off painful areas.  Keep the affected joint raised (elevated).  Apply warm or cold treatments to painful joints. Use of warm or cold treatments depends on which works best for you.  Use crutches if the painful joint is in your leg.  Drink enough fluids to keep your urine clear or pale yellow. This helps your body get rid of uric acid. Limit alcohol, sugary drinks, and fructose drinks.  Follow your dietary instructions. Pay careful attention to the amount of protein you eat. Your daily diet should emphasize fruits, vegetables, whole grains, and fat-free or low-fat milk products. Discuss the use of coffee, vitamin C, and cherries with your caregiver or dietician. These may be helpful in lowering uric acid levels.  Maintain a healthy body weight. SEEK MEDICAL CARE IF:   You develop diarrhea, vomiting, or any side effects from medicines.  You do not feel better in 24 hours, or you are getting worse. SEEK IMMEDIATE MEDICAL CARE IF:   Your joint becomes suddenly more tender, and you have chills or a fever. MAKE SURE YOU:   Understand these instructions.  Will watch your condition.  Will get help right away if you are not doing well or get worse. Document Released: 07/16/2000 Document Revised: 11/13/2012 Document Reviewed: 03/01/2012 [facility] Patient Information 2014 [place].

## 2013-11-27 LAB — PSA: PSA: 1.96 ng/mL (ref <=4.00)

## 2013-11-27 LAB — TSH: TSH: 2.211 u[IU]/mL (ref 0.350–4.500)

## 2014-07-01 ENCOUNTER — Other Ambulatory Visit: Payer: Self-pay | Admitting: Family Medicine

## 2014-08-08 ENCOUNTER — Other Ambulatory Visit: Payer: Self-pay | Admitting: Physician Assistant

## 2014-08-27 ENCOUNTER — Other Ambulatory Visit: Payer: Self-pay | Admitting: Physician Assistant

## 2014-08-27 ENCOUNTER — Telehealth: Payer: Self-pay

## 2014-08-27 NOTE — Telephone Encounter (Signed)
Pt would like a refill on indomethacin (INDOCIN) 50 MG capsule [[id number]] using the pharmacy on [address] on [address]. Please advise at [phone]

## 2014-08-27 NOTE — Telephone Encounter (Signed)
Duplicate- pt needs an OV

## 2014-08-27 NOTE — Telephone Encounter (Signed)
Spoke to pt- Advised he needs to come in for an OV for a refill on this medication.

## 2014-09-02 ENCOUNTER — Other Ambulatory Visit: Payer: Self-pay | Admitting: Physician Assistant

## 2014-09-06 ENCOUNTER — Other Ambulatory Visit: Payer: Self-pay | Admitting: Family Medicine

## 2014-09-06 DIAGNOSIS — I10 Essential (primary) hypertension: Secondary | ICD-10-CM

## 2014-09-06 DIAGNOSIS — E785 Hyperlipidemia, unspecified: Secondary | ICD-10-CM

## 2014-09-06 MED ORDER — LISINOPRIL 20 MG PO TABS: 20.0000 mg | ORAL_TABLET | Freq: Every day | ORAL | Status: DC

## 2014-09-06 MED ORDER — ATORVASTATIN CALCIUM 20 MG PO TABS: ORAL_TABLET | ORAL | Status: DC

## 2014-09-06 NOTE — Telephone Encounter (Signed)
Spoke with Daniel Davis. Informed him that Dr. Carlota Raspberry has approved one more month of his BP meds and we scheduled an OV for him in March. Mr. Lambros was very regretful of his previous conversation with [facility] and asked me to apologize to her. He was very stressed by other things in his life and was sorry for his reaction.  Dr. Horace Porteous first available appt was March 14 ( I took your New Patient slot for this), could we make sure he will have enough BP meds until that OV?

## 2014-09-06 NOTE — Telephone Encounter (Signed)
FYI Jasmine.

## 2014-09-06 NOTE — Telephone Encounter (Signed)
Patient called and stated that he needs a refill on his "piece of medicine". When asked which medication he needs a refill on he stated Lisinopril "or something". He states that he knows he needs an OV however he is completely out of medicine and really wants this medication filled ASAP. Patient is scheduled for a CPE in May with Dr. Carlota Raspberry however patient stated that it is unfair that we did not have any openings at the appointment center for a CPE in April (even though we have a walk in center that is open 7 days a week from morning until evening). Patient unleashed a torrent of profanity at me and stated that this happens to him all the time when trying to get his medications refilled. When asked which pharmacy patient uses he did not know he just replied "the one on [address]." After looking in patient's chart, he uses [facility] on [address].   Best number: [phone]

## 2014-09-06 NOTE — Telephone Encounter (Signed)
He was advised to return in 3-6 months upon review of his labwork and notes. He has been given prior notice of OV needed on med refill. He can have office visit (appt or walk in) for med refill and schedule physical later if needed (after April is ok). I will prescribe one months of meds to allow time to come in to walk-in center or if appt available in that time, he can be scheduled if that is easier.  In regards to profanity used, will discuss this with manager as not appropriate interaction with staff.

## 2014-09-09 MED ORDER — LISINOPRIL 20 MG PO TABS: 20.0000 mg | ORAL_TABLET | Freq: Every day | ORAL | Status: DC

## 2014-09-09 MED ORDER — ATORVASTATIN CALCIUM 20 MG PO TABS: ORAL_TABLET | ORAL | Status: DC

## 2014-09-09 NOTE — Telephone Encounter (Signed)
I sent in additional #30 rx of each to allow enough until OV in March. Thanks.

## 2014-09-23 ENCOUNTER — Ambulatory Visit (INDEPENDENT_AMBULATORY_CARE_PROVIDER_SITE_OTHER): Payer: 59 | Admitting: Internal Medicine

## 2014-09-23 VITALS — BP 134/82 | HR 90 | Temp 98.0°F | Resp 20 | Ht 71.5 in | Wt 230.4 lb

## 2014-09-23 DIAGNOSIS — M25571 Pain in right ankle and joints of right foot: Secondary | ICD-10-CM

## 2014-09-23 MED ORDER — INDOMETHACIN 50 MG PO CAPS: ORAL_CAPSULE | ORAL | Status: DC

## 2014-09-23 NOTE — Progress Notes (Signed)
   Subjective:  This chart was scribed for [employer]. Laney Pastor, MD by Terressa Koyanagi, ED Scribe. This patient was seen in room 3 and the patient's care was started at 7:37 PM.     Patient ID: Daniel Palms., male    DOB: 1956-04-17, 59 y.o.   MRN: [id number]  HPI PCP: Kennon Portela, MD HPI Comments: Daniel Bones. is a 59 y.o. male, with PMH noted below including gout, who presents to the [facility] complaining of right foot pain and associated swelling onset a few days ago. Pt notes that Daniel Davis experienced similar Sx in his left foot recently due to gout. Pt reports Daniel Davis takes indocin for his gout and it works very well.   Past Medical History  Diagnosis Date  . Arthritis   . Gout attack   . Hyperlipidemia   . Hypertension    Past Surgical History  Procedure Laterality Date  . Fracture surgery      LEFT ankle, ORIF   Prior to Admission medications   Medication Sig Start Date End Date Taking? Authorizing Provider  atorvastatin (LIPITOR) 20 MG tablet take 1 tablet by mouth at bedtime 09/09/14  Yes [name], MD  indomethacin (INDOCIN) 50 MG capsule take 1 capsule by mouth three times a day with food for MILD PAIN(GOUT)   Yes Mancel Bale, PA-C  lisinopril (PRINIVIL,ZESTRIL) 20 MG tablet Take 1 tablet (20 mg total) by mouth daily. 09/09/14  Yes [name], MD   Review of Systems  Constitutional: Negative for fever and chills.  Musculoskeletal:       Swelling and pain to right foot.   Psychiatric/Behavioral: Negative for confusion.       Objective:   Physical Exam  Constitutional: Daniel Davis is oriented to person, place, and time. Daniel Davis appears well-developed and well-nourished. No distress.  HENT:  Head: Normocephalic and atraumatic.  Eyes: Conjunctivae and EOM are normal.  Neck: Neck supple.  Cardiovascular: Normal rate.   Pulmonary/Chest: Effort normal. No respiratory distress.  Musculoskeletal: Normal range of motion.  Right foot has mild swelling  over the dorsal and medial malleolus. Tender along medial malleolus, on top of the foot and under the first MTP.   Neurological: Daniel Davis is alert and oriented to person, place, and time.  Skin: Skin is warm and dry.  Psychiatric: Daniel Davis has a normal mood and affect. His behavior is normal.  Nursing note and vitals reviewed.         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 Demarie Uhlig P. Laney Pastor, M.D.

## 2014-10-14 ENCOUNTER — Encounter: Payer: Self-pay | Admitting: Family Medicine

## 2014-10-14 ENCOUNTER — Ambulatory Visit (INDEPENDENT_AMBULATORY_CARE_PROVIDER_SITE_OTHER): Payer: 59 | Admitting: Family Medicine

## 2014-10-14 VITALS — BP 120/82 | HR 77 | Temp 97.9°F | Resp 16 | Ht 70.5 in | Wt 229.0 lb

## 2014-10-14 DIAGNOSIS — E785 Hyperlipidemia, unspecified: Secondary | ICD-10-CM

## 2014-10-14 DIAGNOSIS — I10 Essential (primary) hypertension: Secondary | ICD-10-CM | POA: Diagnosis not present

## 2014-10-14 DIAGNOSIS — M109 Gout, unspecified: Secondary | ICD-10-CM | POA: Diagnosis not present

## 2014-10-14 MED ORDER — ALLOPURINOL 100 MG PO TABS: 100.0000 mg | ORAL_TABLET | Freq: Every day | ORAL | Status: DC

## 2014-10-14 MED ORDER — LISINOPRIL 20 MG PO TABS: 20.0000 mg | ORAL_TABLET | Freq: Every day | ORAL | Status: DC

## 2014-10-14 MED ORDER — ATORVASTATIN CALCIUM 20 MG PO TABS: ORAL_TABLET | ORAL | Status: DC

## 2014-10-14 NOTE — Patient Instructions (Signed)
Return in the next week or two for fasting labs (8 hours fasting).  You should receive a call or letter about your lab results within 10 days.   Start allopurinol in next 2 weeks. Watch for rash or other new reactions. If this occurs - return here or ER.  You can still use indomethacin for acute flare of gout if needed.   Physical in the next 6 months or sooner if appointment is available.   Gout Gout is an inflammatory arthritis caused by a buildup of uric acid crystals in the joints. Uric acid is a chemical that is normally present in the blood. When the level of uric acid in the blood is too high it can form crystals that deposit in your joints and tissues. This causes joint redness, soreness, and swelling (inflammation). Repeat attacks are common. Over time, uric acid crystals can form into masses (tophi) near a joint, destroying bone and causing disfigurement. Gout is treatable and often preventable. CAUSES  The disease begins with elevated levels of uric acid in the blood. Uric acid is produced by your body when it breaks down a naturally found substance called purines. Certain foods you eat, such as meats and fish, contain high amounts of purines. Causes of an elevated uric acid level include:  Being passed down from parent to child (heredity).  Diseases that cause increased uric acid production (such as obesity, psoriasis, and certain cancers).  Excessive alcohol use.  Diet, especially diets rich in meat and seafood.  Medicines, including certain cancer-fighting medicines (chemotherapy), water pills (diuretics), and aspirin.  Chronic kidney disease. The kidneys are no longer able to remove uric acid well.  Problems with metabolism. Conditions strongly associated with gout include:  Obesity.  High blood pressure.  High cholesterol.  Diabetes. Not everyone with elevated uric acid levels gets gout. It is not understood why some people get gout and others do not. Surgery, joint  injury, and eating too much of certain foods are some of the factors that can lead to gout attacks. SYMPTOMS   An attack of gout comes on quickly. It causes intense pain with redness, swelling, and warmth in a joint.  Fever can occur.  Often, only one joint is involved. Certain joints are more commonly involved:  Base of the big toe.  Knee.  Ankle.  Wrist.  Finger. Without treatment, an attack usually goes away in a few days to weeks. Between attacks, you usually will not have symptoms, which is different from many other forms of arthritis. DIAGNOSIS  Your caregiver will suspect gout based on your symptoms and exam. In some cases, tests may be recommended. The tests may include:  Blood tests.  Urine tests.  X-rays.  Joint fluid exam. This exam requires a needle to remove fluid from the joint (arthrocentesis). Using a microscope, gout is confirmed when uric acid crystals are seen in the joint fluid. TREATMENT  There are two phases to gout treatment: treating the sudden onset (acute) attack and preventing attacks (prophylaxis).  Treatment of an Acute Attack.  Medicines are used. These include anti-inflammatory medicines or steroid medicines.  An injection of steroid medicine into the affected joint is sometimes necessary.  The painful joint is rested. Movement can worsen the arthritis.  You may use warm or cold treatments on painful joints, depending which works best for you.  Treatment to Prevent Attacks.  If you suffer from frequent gout attacks, your caregiver may advise preventive medicine. These medicines are started after the acute  attack subsides. These medicines either help your kidneys eliminate uric acid from your body or decrease your uric acid production. You may need to stay on these medicines for a very long time.  The early phase of treatment with preventive medicine can be associated with an increase in acute gout attacks. For this reason, during the first  few months of treatment, your caregiver may also advise you to take medicines usually used for acute gout treatment. Be sure you understand your caregiver's directions. Your caregiver may make several adjustments to your medicine dose before these medicines are effective.  Discuss dietary treatment with your caregiver or dietitian. Alcohol and drinks high in sugar and fructose and foods such as meat, poultry, and seafood can increase uric acid levels. Your caregiver or dietitian can advise you on drinks and foods that should be limited. HOME CARE INSTRUCTIONS   Do not take aspirin to relieve pain. This raises uric acid levels.  Only take over-the-counter or prescription medicines for pain, discomfort, or fever as directed by your caregiver.  Rest the joint as much as possible. When in bed, keep sheets and blankets off painful areas.  Keep the affected joint raised (elevated).  Apply warm or cold treatments to painful joints. Use of warm or cold treatments depends on which works best for you.  Use crutches if the painful joint is in your leg.  Drink enough fluids to keep your urine clear or pale yellow. This helps your body get rid of uric acid. Limit alcohol, sugary drinks, and fructose drinks.  Follow your dietary instructions. Pay careful attention to the amount of protein you eat. Your daily diet should emphasize fruits, vegetables, whole grains, and fat-free or low-fat milk products. Discuss the use of coffee, vitamin C, and cherries with your caregiver or dietitian. These may be helpful in lowering uric acid levels.  Maintain a healthy body weight. SEEK MEDICAL CARE IF:   You develop diarrhea, vomiting, or any side effects from medicines.  You do not feel better in 24 hours, or you are getting worse. SEEK IMMEDIATE MEDICAL CARE IF:   Your joint becomes suddenly more tender, and you have chills or a fever. MAKE SURE YOU:   Understand these instructions.  Will watch your  condition.  Will get help right away if you are not doing well or get worse. Document Released: 07/16/2000 Document Revised: 12/03/2013 Document Reviewed: 03/01/2012 [facility] Patient Information 2015 [place], [place]. This information is not intended to replace advice given to you by your health care provider. Make sure you discuss any questions you have with your health care provider.

## 2014-10-14 NOTE — Progress Notes (Signed)
Subjective:  This chart was scribed for Daniel Staggers, MD by Haywood Pao, ED Scribe at [facility].The patient was seen in exam room 28 and the patient's care was started at 4:04 PM.   Patient ID: Daniel Davis., male    DOB: [birth year], 59 y.o.   MRN: [id number] Chief Complaint  Patient presents with  . Medication Refill    lisinopril and atorvastatin  . Establish Care    previous patient of Dr. Perrin Maltese  . Hyperlipidemia  . Hypertension  . Gout   HPI  HPI Comments: Daniel Davis. is a 59 y.o. male with a history of gout, hypertension and hyperlipidemia who presents to [facility] for a follow up, his last annual exam was April 2015. He is a previous patient of Dr. Perrin Maltese. He also needs refills for lisinopril and lipitor.   Hyperlipidemia: He takes lipitor 20 mg daily. His weight at last visit was 229 and he is the same weight today. His triglycerides were elevated and diet, exercise was encouraged.  Lab Results  Component Value Date   CHOL 188 11/26/2013   HDL 46 11/26/2013   LDLCALC 79 11/26/2013   TRIG 317* 11/26/2013   CHOLHDL 4.1 11/26/2013   Hypertension: He takes lisinopril 20 mg daily. Last kidney function test in April of last year with eGFR greater than 89. He did have a border line sodium of 134 at that time. His numbers have been running in the 120's at home. No side effects on his current lisinopril dosage.  Gout: He was seen by Dr. Merla Riches for a recurrent flare up for his gout on February  22 nd in his right foot. Planned for treatment with indocin 50 mg for an acute flare up and recommended a uric acid recheck. His most recent uric acid was 9.2 one year ago. Before February his last flare up was during the "winter months". In 2015 he believes he had 3 gout flare ups.  Patient Active Problem List   Diagnosis Date Noted  . OA (osteoarthritis) 02/28/2012  . HTN (hypertension) 11/27/2011  . Lipid disorder 11/27/2011  . Gout  attack 11/27/2011   Past Medical History  Diagnosis Date  . Arthritis   . Gout attack   . Hyperlipidemia   . Hypertension    Past Surgical History  Procedure Laterality Date  . Fracture surgery      LEFT ankle, ORIF   No Known Allergies Prior to Admission medications   Medication Sig Start Date End Date Taking? Authorizing Provider  atorvastatin (LIPITOR) 20 MG tablet take 1 tablet by mouth at bedtime 09/09/14  Yes Shade Flood, MD  indomethacin (INDOCIN) 50 MG capsule take 1 capsule by mouth three times a day with food for MILD PAIN(GOUT) 09/23/14  Yes Tonye Pearson, MD  lisinopril (PRINIVIL,ZESTRIL) 20 MG tablet Take 1 tablet (20 mg total) by mouth daily. 09/09/14  Yes Shade Flood, MD   History   Social History  . Marital Status: Married    Spouse Name: Nelma Rothman  . Number of Children: 1  . Years of Education: 14+   Occupational History  . SHIFT SUPERVISOR    Social History Main Topics  . Smoking status: Never Smoker   . Smokeless tobacco: Never Used  . Alcohol Use: 16.0 oz/week    32 drink(s) per week     Comment: 3-5 drinks  . Drug Use: No  . Sexual Activity:    Partners:  Female   Other Topics Concern  . Not on file   Social History Narrative   Lives with his wife and their daughter.   Review of Systems  Constitutional: Negative for fatigue and unexpected weight change.  Eyes: Negative for visual disturbance.  Respiratory: Negative for cough, chest tightness and shortness of breath.   Cardiovascular: Negative for chest pain, palpitations and leg swelling.  Gastrointestinal: Negative for abdominal pain and blood in stool.  Neurological: Negative for dizziness, light-headedness and headaches.      Objective:  BP 120/82 mmHg  Pulse 77  Temp(Src) 97.9 F (36.6 C) (Oral)  Resp 16  Ht 5' 10.5" (1.791 m)  Wt 229 lb (103.874 kg)  BMI 32.38 kg/m2  SpO2 97%  Physical Exam  Constitutional: He is oriented to person, place, and time. He appears  well-developed and well-nourished. No distress.  HENT:  Head: Normocephalic and atraumatic.  Eyes: EOM are normal. Pupils are equal, round, and reactive to light.  Neck: Normal range of motion. No JVD present. Carotid bruit is not present.  Cardiovascular: Normal rate, regular rhythm and normal heart sounds.   No murmur heard. Pulmonary/Chest: Effort normal and breath sounds normal. No respiratory distress. He has no rales.  Musculoskeletal: Normal range of motion. He exhibits no edema.  Neurological: He is alert and oriented to person, place, and time.  Skin: Skin is warm and dry.  Psychiatric: He has a normal mood and affect. His behavior is normal.  Nursing note and vitals reviewed.      Assessment & Plan:   Daniel Davis. is a 59 y.o. male Essential hypertension - Plan: COMPLETE METABOLIC PANEL WITH GFR, lisinopril (PRINIVIL,ZESTRIL) 20 MG tablet, DISCONTINUED: lisinopril (PRINIVIL,ZESTRIL) 20 MG tablet  -stable.  Continue lisinopril at same dose. CMP pending.   Hyperlipidemia - Plan: COMPLETE METABOLIC PANEL WITH GFR, Lipid panel, atorvastatin (LIPITOR) 20 MG tablet, DISCONTINUED: atorvastatin (LIPITOR) 20 MG tablet  -lipid panel pending. Continue lipitor $RemoveBeforeDE'20mg'[id number]$  qd for now.   Gout of multiple sites, unspecified cause, unspecified chronicity - Plan: allopurinol (ZYLOPRIM) 100 MG tablet, Uric Acid  -frequent flairs last year and discussed nature of destructive arthropathy of gout. Will start allopurinol at $RemoveBefore'100mg'XYRHDATdMsFMm$  QD in next few weeks (s/p recent flare), and uric pending. Discussed if rash on this med or other new side effects, RTC right away.   Meds ordered this encounter  . allopurinol (ZYLOPRIM) 100 MG tablet    Sig: Take 1 tablet (100 mg total) by mouth daily.    Dispense:  30 tablet    Refill:  6  . atorvastatin (LIPITOR) 20 MG tablet    Sig: take 1 tablet by mouth at bedtime    Dispense:  90 tablet    Refill:  1  . lisinopril (PRINIVIL,ZESTRIL) 20 MG tablet    Sig: Take 1  tablet (20 mg total) by mouth daily.    Dispense:  90 tablet    Refill:  1   Patient Instructions  Return in the next week or two for fasting labs (8 hours fasting).  You should receive a call or letter about your lab results within 10 days.   Start allopurinol in next 2 weeks. Watch for rash or other new reactions. If this occurs - return here or ER.  You can still use indomethacin for acute flare of gout if needed.   Physical in the next 6 months or sooner if appointment is available.   Gout Gout is an inflammatory arthritis caused by a  buildup of uric acid crystals in the joints. Uric acid is a chemical that is normally present in the blood. When the level of uric acid in the blood is too high it can form crystals that deposit in your joints and tissues. This causes joint redness, soreness, and swelling (inflammation). Repeat attacks are common. Over time, uric acid crystals can form into masses (tophi) near a joint, destroying bone and causing disfigurement. Gout is treatable and often preventable. CAUSES  The disease begins with elevated levels of uric acid in the blood. Uric acid is produced by your body when it breaks down a naturally found substance called purines. Certain foods you eat, such as meats and fish, contain high amounts of purines. Causes of an elevated uric acid level include:  Being passed down from parent to child (heredity).  Diseases that cause increased uric acid production (such as obesity, psoriasis, and certain cancers).  Excessive alcohol use.  Diet, especially diets rich in meat and seafood.  Medicines, including certain cancer-fighting medicines (chemotherapy), water pills (diuretics), and aspirin.  Chronic kidney disease. The kidneys are no longer able to remove uric acid well.  Problems with metabolism. Conditions strongly associated with gout include:  Obesity.  High blood pressure.  High cholesterol.  Diabetes. Not everyone with elevated uric  acid levels gets gout. It is not understood why some people get gout and others do not. Surgery, joint injury, and eating too much of certain foods are some of the factors that can lead to gout attacks. SYMPTOMS   An attack of gout comes on quickly. It causes intense pain with redness, swelling, and warmth in a joint.  Fever can occur.  Often, only one joint is involved. Certain joints are more commonly involved:  Base of the big toe.  Knee.  Ankle.  Wrist.  Finger. Without treatment, an attack usually goes away in a few days to weeks. Between attacks, you usually will not have symptoms, which is different from many other forms of arthritis. DIAGNOSIS  Your caregiver will suspect gout based on your symptoms and exam. In some cases, tests may be recommended. The tests may include:  Blood tests.  Urine tests.  X-rays.  Joint fluid exam. This exam requires a needle to remove fluid from the joint (arthrocentesis). Using a microscope, gout is confirmed when uric acid crystals are seen in the joint fluid. TREATMENT  There are two phases to gout treatment: treating the sudden onset (acute) attack and preventing attacks (prophylaxis).  Treatment of an Acute Attack.  Medicines are used. These include anti-inflammatory medicines or steroid medicines.  An injection of steroid medicine into the affected joint is sometimes necessary.  The painful joint is rested. Movement can worsen the arthritis.  You may use warm or cold treatments on painful joints, depending which works best for you.  Treatment to Prevent Attacks.  If you suffer from frequent gout attacks, your caregiver may advise preventive medicine. These medicines are started after the acute attack subsides. These medicines either help your kidneys eliminate uric acid from your body or decrease your uric acid production. You may need to stay on these medicines for a very long time.  The early phase of treatment with  preventive medicine can be associated with an increase in acute gout attacks. For this reason, during the first few months of treatment, your caregiver may also advise you to take medicines usually used for acute gout treatment. Be sure you understand your caregiver's directions. Your caregiver may make  several adjustments to your medicine dose before these medicines are effective.  Discuss dietary treatment with your caregiver or dietitian. Alcohol and drinks high in sugar and fructose and foods such as meat, poultry, and seafood can increase uric acid levels. Your caregiver or dietitian can advise you on drinks and foods that should be limited. HOME CARE INSTRUCTIONS   Do not take aspirin to relieve pain. This raises uric acid levels.  Only take over-the-counter or prescription medicines for pain, discomfort, or fever as directed by your caregiver.  Rest the joint as much as possible. When in bed, keep sheets and blankets off painful areas.  Keep the affected joint raised (elevated).  Apply warm or cold treatments to painful joints. Use of warm or cold treatments depends on which works best for you.  Use crutches if the painful joint is in your leg.  Drink enough fluids to keep your urine clear or pale yellow. This helps your body get rid of uric acid. Limit alcohol, sugary drinks, and fructose drinks.  Follow your dietary instructions. Pay careful attention to the amount of protein you eat. Your daily diet should emphasize fruits, vegetables, whole grains, and fat-free or low-fat milk products. Discuss the use of coffee, vitamin C, and cherries with your caregiver or dietitian. These may be helpful in lowering uric acid levels.  Maintain a healthy body weight. SEEK MEDICAL CARE IF:   You develop diarrhea, vomiting, or any side effects from medicines.  You do not feel better in 24 hours, or you are getting worse. SEEK IMMEDIATE MEDICAL CARE IF:   Your joint becomes suddenly more  tender, and you have chills or a fever. MAKE SURE YOU:   Understand these instructions.  Will watch your condition.  Will get help right away if you are not doing well or get worse. Document Released: 07/16/2000 Document Revised: 12/03/2013 Document Reviewed: 03/01/2012 [facility] Patient Information 2015 [place], [place]. This information is not intended to replace advice given to you by your health care provider. Make sure you discuss any questions you have with your health care provider.     I personally performed the services described in this documentation, which was scribed in my presence. The recorded information has been reviewed and considered, and addended by me as needed.

## 2014-10-15 ENCOUNTER — Other Ambulatory Visit (INDEPENDENT_AMBULATORY_CARE_PROVIDER_SITE_OTHER): Payer: 59 | Admitting: Family Medicine

## 2014-10-15 DIAGNOSIS — M109 Gout, unspecified: Secondary | ICD-10-CM

## 2014-10-15 DIAGNOSIS — E785 Hyperlipidemia, unspecified: Secondary | ICD-10-CM

## 2014-10-15 DIAGNOSIS — I10 Essential (primary) hypertension: Secondary | ICD-10-CM

## 2014-10-15 LAB — COMPLETE METABOLIC PANEL WITH GFR
ALT: 19 U/L (ref 0–53)
AST: 19 U/L (ref 0–37)
Albumin: 4.2 g/dL (ref 3.5–5.2)
Alkaline Phosphatase: 60 U/L (ref 39–117)
BUN: 17 mg/dL (ref 6–23)
CO2: 26 mEq/L (ref 19–32)
Calcium: 9.9 mg/dL (ref 8.4–10.5)
Chloride: 101 mEq/L (ref 96–112)
Creat: 0.99 mg/dL (ref 0.50–1.35)
GFR, Est African American: >89 mL/min
GFR, Est Non African American: 85 mL/min
GLUCOSE: 96 mg/dL (ref 70–99)
POTASSIUM: 4.9 meq/L (ref 3.5–5.3)
Sodium: 136 mEq/L (ref 135–145)
Total Bilirubin: 0.6 mg/dL (ref 0.2–1.2)
Total Protein: 7.1 g/dL (ref 6.0–8.3)

## 2014-10-15 LAB — LIPID PANEL
CHOL/HDL RATIO: 5 ratio
Cholesterol: 161 mg/dL (ref 0–200)
HDL: 32 mg/dL — ABNORMAL LOW (ref >=40)
LDL CALC: 74 mg/dL (ref 0–99)
Triglycerides: 276 mg/dL — ABNORMAL HIGH (ref <150)
VLDL: 55 mg/dL — AB (ref 0–40)

## 2014-10-15 LAB — URIC ACID: Uric Acid, Serum: 9.5 mg/dL — ABNORMAL HIGH (ref 4.0–7.8)

## 2014-10-26 ENCOUNTER — Ambulatory Visit (INDEPENDENT_AMBULATORY_CARE_PROVIDER_SITE_OTHER): Payer: 59 | Admitting: Emergency Medicine

## 2014-10-26 VITALS — BP 132/84 | HR 82 | Temp 98.0°F | Resp 16 | Ht 71.0 in | Wt 234.0 lb

## 2014-10-26 DIAGNOSIS — M10062 Idiopathic gout, left knee: Secondary | ICD-10-CM

## 2014-10-26 DIAGNOSIS — M25562 Pain in left knee: Secondary | ICD-10-CM | POA: Diagnosis not present

## 2014-10-26 DIAGNOSIS — M109 Gout, unspecified: Secondary | ICD-10-CM

## 2014-10-26 LAB — URIC ACID: URIC ACID, SERUM: 6.7 mg/dL (ref 4.0–7.8)

## 2014-10-26 MED ORDER — OXYCODONE-ACETAMINOPHEN 5-325 MG PO TABS: 1.0000 | ORAL_TABLET | Freq: Three times a day (TID) | ORAL | Status: DC | PRN

## 2014-10-26 MED ORDER — COLCHICINE 0.6 MG PO TABS: 0.6000 mg | ORAL_TABLET | Freq: Every day | ORAL | Status: DC

## 2014-10-26 NOTE — Progress Notes (Signed)
    MRN: [id number] DOB: 1956/01/16  Subjective:   Daniel Emersen Carroll. is a 59 y.o. male with pmh of gout presenting for chief complaint of Joint Swelling  Reports 4 day history of left knee swelling, pain with movement and increased walking. Has tried Indomethacin with minimal relief. Was started on Allopurinol for gout prevention in 09/2014. He continues to take this. Denies fevers, calf pain, thigh pain, foot swelling, erythema, foot pain. 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 Tries to avoid eating excessive protein but does splurge sometimes. Denies any other aggravating or relieving factors, no other questions or concerns.  Daniel Davis has a current medication list which includes the following prescription(s): allopurinol, atorvastatin, indomethacin, and lisinopril.   has No Known Allergies.  Daniel Davis  has a past medical history of Arthritis; Gout attack; Hyperlipidemia; and Hypertension. Also  has past surgical history that includes Fracture surgery.  ROS As in subjective.  Objective:   Vitals: BP 132/84 mmHg  Pulse 82  Temp(Src) 98 F (36.7 C)  Resp 16  Ht 5\' 11"  (1.803 m)  Wt 234 lb (106.142 kg)  BMI 32.65 kg/m2  SpO2 98%  Physical Exam  Constitutional: He is oriented to person, place, and time and well-developed, well-nourished, and in no distress.  Cardiovascular: Normal rate.   Pulmonary/Chest: Effort normal.  Musculoskeletal:       Left knee: He exhibits decreased range of motion (flexion), swelling and erythema (mild). He exhibits no ecchymosis, no deformity and no laceration. Tenderness (over medial, lateral knee and patella) found.  Neurological: He is alert and oriented to person, place, and time.  Skin: Skin is warm and dry. No rash noted. There is erythema (mild). No pallor.   Assessment and Plan :   1. Acute gout of left knee, unspecified cause 2. Knee pain, acute, left - Uric acid level pending, patient has mild-moderate diffuse swelling of left  knee but not enough to consider aspiration - Advised patient to hold Allopurinol, indomethacin, will start colchicine up to 6mg . May also use Percocet for knee pain, restart indomethacin following course of colchicine, restart Allopurinol after resolution of symptoms.  - Return to clinic if worsening symptoms, no improvement in 2-3 days. Consider referral to ortho. - Counseled patient on potential risk for adverse effects with colchicine, advised to hold statin for now, restart in 1 week. Patient acknowledged risks and agreed to trial of colchicine.   Jaynee Eagles, PA-C [facility] [phone] 10/26/2014 8:16 AM

## 2014-10-26 NOTE — Patient Instructions (Signed)
-   Please hold the indomethacin. We will try Colchicine. - Start with 2 tablets of Colchicine - Take 1 tablet 1 hour later - Thereafter take 1 tablet every 3 hours as needed until pain stops or if you start having diarrhea. - Please hold the Allopurinol for 3 days. You may resume this if your gout flare has resolved.

## 2014-10-27 ENCOUNTER — Encounter: Payer: Self-pay | Admitting: Urgent Care

## 2014-10-31 ENCOUNTER — Encounter: Payer: Self-pay | Admitting: Radiology

## 2014-12-16 ENCOUNTER — Encounter: Payer: Self-pay | Admitting: Family Medicine

## 2014-12-16 ENCOUNTER — Ambulatory Visit (INDEPENDENT_AMBULATORY_CARE_PROVIDER_SITE_OTHER): Payer: 59 | Admitting: Family Medicine

## 2014-12-16 VITALS — BP 125/80 | HR 76 | Temp 98.5°F | Resp 16 | Ht 70.5 in | Wt 228.4 lb

## 2014-12-16 DIAGNOSIS — Z1211 Encounter for screening for malignant neoplasm of colon: Secondary | ICD-10-CM

## 2014-12-16 DIAGNOSIS — Z125 Encounter for screening for malignant neoplasm of prostate: Secondary | ICD-10-CM | POA: Diagnosis not present

## 2014-12-16 DIAGNOSIS — E785 Hyperlipidemia, unspecified: Secondary | ICD-10-CM | POA: Diagnosis not present

## 2014-12-16 DIAGNOSIS — M109 Gout, unspecified: Secondary | ICD-10-CM | POA: Diagnosis not present

## 2014-12-16 DIAGNOSIS — Z0000 Encounter for general adult medical examination without abnormal findings: Secondary | ICD-10-CM

## 2014-12-16 DIAGNOSIS — I10 Essential (primary) hypertension: Secondary | ICD-10-CM | POA: Diagnosis not present

## 2014-12-16 DIAGNOSIS — R195 Other fecal abnormalities: Secondary | ICD-10-CM | POA: Diagnosis not present

## 2014-12-16 DIAGNOSIS — Z800 Family history of malignant neoplasm of digestive organs: Secondary | ICD-10-CM | POA: Diagnosis not present

## 2014-12-16 LAB — IFOBT (OCCULT BLOOD): IFOBT: POSITIVE

## 2014-12-16 MED ORDER — INDOMETHACIN 50 MG PO CAPS: ORAL_CAPSULE | ORAL | Status: DC

## 2014-12-16 MED ORDER — LISINOPRIL 20 MG PO TABS: 20.0000 mg | ORAL_TABLET | Freq: Every day | ORAL | Status: DC

## 2014-12-16 MED ORDER — ATORVASTATIN CALCIUM 20 MG PO TABS: ORAL_TABLET | ORAL | Status: DC

## 2014-12-16 MED ORDER — ALLOPURINOL 100 MG PO TABS: 100.0000 mg | ORAL_TABLET | Freq: Every day | ORAL | Status: DC

## 2014-12-16 NOTE — Progress Notes (Signed)
   Subjective:    Patient ID: Daniel Palms., male    DOB: [birth year], 59 y.o.   MRN: [id number]  HPI    Review of Systems  Constitutional: Negative.   HENT: Negative.   Eyes: Negative.   Respiratory: Negative.   Cardiovascular: Negative.   Gastrointestinal: Negative.   Endocrine: Negative.   Genitourinary: Negative.   Musculoskeletal: Negative.   Skin: Negative.   Allergic/Immunologic: Negative.   Neurological: Negative.   Hematological: Negative.   Psychiatric/Behavioral: Negative.        Objective:   Physical Exam        Assessment & Plan:

## 2014-12-16 NOTE — Progress Notes (Addendum)
Subjective:  This chart was scribed for Merri Ray, MD by Moises Blood, Medical Scribe. This patient was seen in room 28 and the patient's care was started 1:56 PM.    Patient ID: Daniel Palms., male    DOB: 05/15/1956, 59 y.o.   MRN: [id number]  HPI Daniel Zalman Hull. is a 59 y.o. male here for annual physical exam.   Cancer Screening Colon Cancer Screening: he had a colonosocopy in July 2009 - noted diverticulosis - otherwise no concerns.He denies blood in stool.  His mother had colon cancer and was diagnosed in her 57s. She had her 2nd operation 4 years ago.    Prostate Cancer Screening: last tested 2015 - desires screening again today.  No family history of prostate cancer.  Lab Results  Component Value Date   PSA 1.96 11/26/2013   PSA 1.49 02/28/2012    Immunizations Immunization History  Administered Date(s) Administered  . Influenza Nasal 05/16/2014  . Tdap 12/27/2007   Exercise He notes 150 minutes a week of mixed cardio consisting of yard work and walking.  He plans to increase weight training from once a week to twice a week.  Dentist He last saw his dentist last week. He states seeing the dentist 2-3 times a year.  Eye care Provider Patient last saw 4-5 years ago.   Depression Screening Depression screen [facility] 2/9 12/16/2014 11/26/2013  Decreased Interest 0 0  Down, Depressed, Hopeless 0 0  PHQ - 2 Score 0 0   Functional Status Survey No positive responses  Advanced directives He made a will approximately 10 years ago but not visited recently. Plans to look at this again. He is currently Full Code.   Gout His last uric acid was checked a month ago. It was 6.7, which decreased from 9.5 when checked 2 months ago. He took allopurinol 100 mg each day, but stopped during acute flare 2 months ago when he was treated with colchicine. Now takes allopurinol two times per week. He prefers indomethacin in case of flare ups.   Hyperlipidemia Lab Results    Component Value Date   CHOL 161 10/15/2014   HDL 32* 10/15/2014   LDLCALC 74 10/15/2014   TRIG 276* 10/15/2014   CHOLHDL 5.0 10/15/2014   He continued on Lipitor 20 mg at bed time.  Plan on recheck levels in 3-6 months from last visit.  Of note, weight is down from 229 to 228.  He was advised on diet and exercise on top of his Lipitor.  Lab Results  Component Value Date   ALT 19 10/15/2014   AST 19 10/15/2014   ALKPHOS 60 10/15/2014   BILITOT 0.6 10/15/2014    Body mass index is 32.3 kg/(m^2).  Hypertension Takes Lisinopril 20 mg once a day Lab Results  Component Value Date   CREATININE 0.99 10/15/2014   He states no new cough, no chest pain, no SOB, no headache, no dizziness.    Patient Active Problem List   Diagnosis Date Noted  . OA (osteoarthritis) 02/28/2012  . HTN (hypertension) 11/27/2011  . Lipid disorder 11/27/2011  . Gout attack 11/27/2011   Past Medical History  Diagnosis Date  . Arthritis   . Gout attack   . Hyperlipidemia   . Hypertension    Past Surgical History  Procedure Laterality Date  . Fracture surgery      LEFT ankle, ORIF   No Known Allergies Prior to Admission medications   Medication Sig Start Date End Date Taking?  Authorizing Provider  allopurinol (ZYLOPRIM) 100 MG tablet Take 1 tablet (100 mg total) by mouth daily. 10/14/14  Yes [name], MD  atorvastatin (LIPITOR) 20 MG tablet take 1 tablet by mouth at bedtime 10/14/14  Yes [name], MD  colchicine 0.6 MG tablet Take 1 tablet (0.6 mg total) by mouth daily. 10/26/14  Yes Jaynee Eagles, PA-C  indomethacin (INDOCIN) 50 MG capsule take 1 capsule by mouth three times a day with food for MILD PAIN(GOUT) 09/23/14  Yes Leandrew Koyanagi, MD  lisinopril (PRINIVIL,ZESTRIL) 20 MG tablet Take 1 tablet (20 mg total) by mouth daily. 10/14/14  Yes [name], MD  oxyCODONE-acetaminophen (ROXICET) 5-325 MG per tablet Take 1 tablet by mouth every 8 (eight) hours as needed for severe  pain. Patient not taking: Reported on 12/16/2014 10/26/14   Jaynee Eagles, PA-C   History   Social History  . Marital Status: Married    Spouse Name: [name]  . Number of Children: 1  . Years of Education: 14+   Occupational History  . SHIFT SUPERVISOR    Social History Main Topics  . Smoking status: Never Smoker   . Smokeless tobacco: Never Used  . Alcohol Use: 16.0 oz/week    32 Standard drinks or equivalent per week     Comment: 3-5 drinks  . Drug Use: No  . Sexual Activity:    Partners: Female   Other Topics Concern  . Not on file   Social History Narrative   Lives with his wife and their daughter.      Review of Systems  Constitutional: Negative for fatigue and unexpected weight change.  Eyes: Negative for visual disturbance.  Respiratory: Negative for cough, chest tightness and shortness of breath.   Cardiovascular: Negative for chest pain, palpitations and leg swelling.  Gastrointestinal: Negative for abdominal pain and blood in stool.  Neurological: Negative for dizziness, light-headedness and headaches.   13 point ROS reviewed by patient health survey negative other than discussed above or in reviewed nursing note     Objective:   Physical Exam  Constitutional: He is oriented to person, place, and time. He appears well-developed and well-nourished.  HENT:  Head: Normocephalic and atraumatic.  Right Ear: External ear normal.  Left Ear: External ear normal.  Mouth/Throat: Oropharynx is clear and moist.  Eyes: Conjunctivae and EOM are normal. Pupils are equal, round, and reactive to light.  Neck: Normal range of motion. Neck supple. No JVD present. Carotid bruit is not present. No thyromegaly present.  Cardiovascular: Normal rate, regular rhythm, normal heart sounds and intact distal pulses.   No murmur heard. Pulmonary/Chest: Effort normal and breath sounds normal. No respiratory distress. He has no wheezes. He has no rales.  Abdominal: Soft. He exhibits no  distension. There is no tenderness. Hernia confirmed negative in the right inguinal area and confirmed negative in the left inguinal area.  Genitourinary: Prostate normal.  Musculoskeletal: Normal range of motion. He exhibits no edema or tenderness.  Lymphadenopathy:    He has no cervical adenopathy.  Neurological: He is alert and oriented to person, place, and time. He has normal reflexes.  Skin: Skin is warm and dry.  Psychiatric: He has a normal mood and affect. His behavior is normal.  Vitals reviewed.     Filed Vitals:   12/16/14 1315  BP: 125/80  Pulse: 76  Temp: 98.5 F (36.9 C)  TempSrc: Oral  Resp: 16  Height: 5' 10.5" (1.791 m)  Weight: 228 lb  6.4 oz (103.602 kg)  SpO2: 99%    Visual Acuity Screening   Right eye Left eye Both eyes  Without correction: 20/13 20/13 20/13   With correction:        Results for orders placed or performed in visit on 12/16/14  IFOBT POC (occult bld, rslt in office)  Result Value Ref Range   IFOBT Positive        Assessment & Plan:   Onur Mori. is a 59 y.o. male Annual physical exam - Plan: PSA  --anticipatory guidance as below in AVS, screening labs above. Health maintenance items as above in HPI discussed/recommended as applicable.   Essential hypertension - Plan: lisinopril (PRINIVIL,ZESTRIL) 20 MG tablet  -stable, no med changes.   Hyperlipidemia - Plan: atorvastatin (LIPITOR) 20 MG tablet  -Continue lipitor same dose, fasting labs next OV.  Gout of multiple sites, unspecified cause, unspecified chronicity - Plan: indomethacin (INDOCIN) 50 MG capsule, allopurinol (ZYLOPRIM) 100 MG tablet  -restart allopurinol at 100mg  Qd. rx indomethacin if needed, SED and cardiac risks discussed.   Screening for prostate cancer  -We discussed pros and cons of prostate cancer screening, and after this discussion, he chose to have screening done. PSA obtained, and no concerning findings on DRE.   Special screening for malignant  neoplasms, colon, Family history of colon cancer, Heme positive stool - Plan: Ambulatory referral to Gastroenterology  -heme pos stool on exam today, history of diverticulosis, but also new info of FH of colon CA - refer to GI to determine if sooner repeat colonoscopy needed.   Meds ordered this encounter  Medications  . indomethacin (INDOCIN) 50 MG capsule    Sig: take 1 capsule by mouth three times a day with food for MILD PAIN(GOUT)    Dispense:  60 capsule    Refill:  0  . lisinopril (PRINIVIL,ZESTRIL) 20 MG tablet    Sig: Take 1 tablet (20 mg total) by mouth daily.    Dispense:  90 tablet    Refill:  1  . atorvastatin (LIPITOR) 20 MG tablet    Sig: take 1 tablet by mouth at bedtime    Dispense:  90 tablet    Refill:  1  . allopurinol (ZYLOPRIM) 100 MG tablet    Sig: Take 1 tablet (100 mg total) by mouth daily.    Dispense:  30 tablet    Refill:  6   Patient Instructions  You should receive a call or letter about your lab results within the next week to 10 days.  Restart allopurinol once per day.  If gout flares - indomethacin short term.  continue the good work on exercise. i will refer you to gastroenterologist for discussion of repeat colonoscopy as blood was noted in the stool today. Follow up in 6 months.  Return to the clinic or go to the nearest emergency room if any of your symptoms worsen or new symptoms occur.  Keeping you healthy  Get these tests  Blood pressure- Have your blood pressure checked once a year by your healthcare provider.  Normal blood pressure is 120/80  Weight- Have your body mass index (BMI) calculated to screen for obesity.  BMI is a measure of body fat based on height and weight. You can also calculate your own BMI at [email or web address].  Cholesterol- Have your cholesterol checked every year.  Diabetes- Have your blood sugar checked regularly if you have high blood pressure, high cholesterol, have a family history of diabetes or if you  are  overweight.  Screening for Colon Cancer- Colonoscopy starting at age 27.  Screening may begin sooner depending on your family history and other health conditions. Follow up colonoscopy as directed by your Gastroenterologist.  Screening for Prostate Cancer- Both blood work (PSA) and a rectal exam help screen for Prostate Cancer.  Screening begins at age 71 with African-American men and at age 92 with Caucasian men.  Screening may begin sooner depending on your family history.  Take these medicines  Aspirin- One aspirin daily can help prevent Heart disease and Stroke.  Flu shot- Every fall.  Tetanus- Every 10 years.  Zostavax- Once after the age of 20 to prevent Shingles.  Pneumonia shot- Once after the age of 16; if you are younger than 100, ask your healthcare provider if you need a Pneumonia shot.  Take these steps  Don't smoke- If you do smoke, talk to your doctor about quitting.  For tips on how to quit, go to www.smokefree.gov or call 1-800-QUIT-NOW.  Be physically active- Exercise 5 days a week for at least 30 minutes.  If you are not already physically active start slow and gradually work up to 30 minutes of moderate physical activity.  Examples of moderate activity include walking briskly, mowing the yard, dancing, swimming, bicycling, etc.  Eat a healthy diet- Eat a variety of healthy food such as fruits, vegetables, low fat milk, low fat cheese, yogurt, lean meant, poultry, fish, beans, tofu, etc. For more information go to www.thenutritionsource.org  Drink alcohol in moderation- Limit alcohol intake to less than two drinks a day. Never drink and drive.  Dentist- Brush and floss twice daily; visit your dentist twice a year.  Depression- Your emotional health is as important as your physical health. If you're feeling down, or losing interest in things you would normally enjoy please talk to your healthcare provider.  Eye exam- Visit your eye doctor every year.  Safe sex-  If you may be exposed to a sexually transmitted infection, use a condom.  Seat belts- Seat belts can save your life; always wear one.  Smoke/Carbon Monoxide detectors- These detectors need to be installed on the appropriate level of your home.  Replace batteries at least once a year.  Skin cancer- When out in the sun, cover up and use sunscreen 15 SPF or higher.  Violence- If anyone is threatening you, please tell your healthcare provider.  Living Will/ Health care power of attorney- Speak with your healthcare provider and family.        I personally performed the services described in this documentation, which was scribed in my presence. The recorded information has been reviewed and considered, and addended by me as needed.

## 2014-12-16 NOTE — Patient Instructions (Addendum)
You should receive a call or letter about your lab results within the next week to 10 days.  Restart allopurinol once per day.  If gout flares - indomethacin short term.  continue the good work on exercise. i will refer you to gastroenterologist for discussion of repeat colonoscopy as blood was noted in the stool today. Follow up in 6 months.  Return to the clinic or go to the nearest emergency room if any of your symptoms worsen or new symptoms occur.  Keeping you healthy  Get these tests  Blood pressure- Have your blood pressure checked once a year by your healthcare provider.  Normal blood pressure is 120/80  Weight- Have your body mass index (BMI) calculated to screen for obesity.  BMI is a measure of body fat based on height and weight. You can also calculate your own BMI at [email or web address].  Cholesterol- Have your cholesterol checked every year.  Diabetes- Have your blood sugar checked regularly if you have high blood pressure, high cholesterol, have a family history of diabetes or if you are overweight.  Screening for Colon Cancer- Colonoscopy starting at age 55.  Screening may begin sooner depending on your family history and other health conditions. Follow up colonoscopy as directed by your Gastroenterologist.  Screening for Prostate Cancer- Both blood work (PSA) and a rectal exam help screen for Prostate Cancer.  Screening begins at age 60 with African-American men and at age 38 with Caucasian men.  Screening may begin sooner depending on your family history.  Take these medicines  Aspirin- One aspirin daily can help prevent Heart disease and Stroke.  Flu shot- Every fall.  Tetanus- Every 10 years.  Zostavax- Once after the age of 59 to prevent Shingles.  Pneumonia shot- Once after the age of 61; if you are younger than 23, ask your healthcare provider if you need a Pneumonia shot.  Take these steps  Don't smoke- If you do smoke, talk to your doctor about  quitting.  For tips on how to quit, go to www.smokefree.gov or call 1-800-QUIT-NOW.  Be physically active- Exercise 5 days a week for at least 30 minutes.  If you are not already physically active start slow and gradually work up to 30 minutes of moderate physical activity.  Examples of moderate activity include walking briskly, mowing the yard, dancing, swimming, bicycling, etc.  Eat a healthy diet- Eat a variety of healthy food such as fruits, vegetables, low fat milk, low fat cheese, yogurt, lean meant, poultry, fish, beans, tofu, etc. For more information go to www.thenutritionsource.org  Drink alcohol in moderation- Limit alcohol intake to less than two drinks a day. Never drink and drive.  Dentist- Brush and floss twice daily; visit your dentist twice a year.  Depression- Your emotional health is as important as your physical health. If you're feeling down, or losing interest in things you would normally enjoy please talk to your healthcare provider.  Eye exam- Visit your eye doctor every year.  Safe sex- If you may be exposed to a sexually transmitted infection, use a condom.  Seat belts- Seat belts can save your life; always wear one.  Smoke/Carbon Monoxide detectors- These detectors need to be installed on the appropriate level of your home.  Replace batteries at least once a year.  Skin cancer- When out in the sun, cover up and use sunscreen 15 SPF or higher.  Violence- If anyone is threatening you, please tell your healthcare provider.  Living Will/ Health care power of attorney-  Speak with your healthcare provider and family.

## 2014-12-17 LAB — PSA: PSA: 2.37 ng/mL (ref <=4.00)

## 2014-12-18 ENCOUNTER — Telehealth: Payer: Self-pay

## 2014-12-18 NOTE — Telephone Encounter (Signed)
His Ifob was positive. Pt was told he does need to see gastroenterology. Pt states he would call back to make appt.

## 2014-12-18 NOTE — Telephone Encounter (Signed)
Pt wanted to make sure he was being referred at the right place, states when he had his pe, was told they checked for prostate and noticed blood in his urine but where he was referred was for Blood in his stool and they wanted to make sure it was correct.  Please call [phone]

## 2014-12-19 ENCOUNTER — Encounter: Payer: Self-pay | Admitting: Gastroenterology

## 2014-12-23 ENCOUNTER — Encounter: Payer: Self-pay | Admitting: *Deleted

## 2015-01-27 ENCOUNTER — Encounter: Payer: Self-pay | Admitting: Gastroenterology

## 2015-02-28 ENCOUNTER — Ambulatory Visit (INDEPENDENT_AMBULATORY_CARE_PROVIDER_SITE_OTHER): Payer: 59 | Admitting: Gastroenterology

## 2015-02-28 ENCOUNTER — Other Ambulatory Visit (INDEPENDENT_AMBULATORY_CARE_PROVIDER_SITE_OTHER): Payer: 59

## 2015-02-28 ENCOUNTER — Encounter: Payer: Self-pay | Admitting: Gastroenterology

## 2015-02-28 VITALS — BP 110/82 | HR 96 | Ht 70.08 in | Wt 229.5 lb

## 2015-02-28 DIAGNOSIS — R195 Other fecal abnormalities: Secondary | ICD-10-CM

## 2015-02-28 DIAGNOSIS — Z809 Family history of malignant neoplasm, unspecified: Secondary | ICD-10-CM

## 2015-02-28 LAB — CBC WITH DIFFERENTIAL/PLATELET
BASOS PCT: 0.3 % (ref 0.0–3.0)
Basophils Absolute: 0 10*3/uL (ref 0.0–0.1)
EOS ABS: 0.1 10*3/uL (ref 0.0–0.7)
Eosinophils Relative: 0.9 % (ref 0.0–5.0)
HEMATOCRIT: 40.1 % (ref 39.0–52.0)
HEMOGLOBIN: 13.5 g/dL (ref 13.0–17.0)
LYMPHS PCT: 28.6 % (ref 12.0–46.0)
Lymphs Abs: 2.3 10*3/uL (ref 0.7–4.0)
MCHC: 33.8 g/dL (ref 30.0–36.0)
MCV: 81 fl (ref 78.0–100.0)
MONO ABS: 0.6 10*3/uL (ref 0.1–1.0)
Monocytes Relative: 7 % (ref 3.0–12.0)
NEUTROS ABS: 5.1 10*3/uL (ref 1.4–7.7)
Neutrophils Relative %: 63.2 % (ref 43.0–77.0)
Platelets: 393 10*3/uL (ref 150.0–400.0)
RBC: 4.95 Mil/uL (ref 4.22–5.81)
RDW: 13.6 % (ref 11.5–15.5)
WBC: 8 10*3/uL (ref 4.0–10.5)

## 2015-02-28 MED ORDER — NA SULFATE-K SULFATE-MG SULF 17.5-3.13-1.6 GM/177ML PO SOLN: 1.0000 | Freq: Once | ORAL | Status: DC

## 2015-02-28 NOTE — Progress Notes (Signed)
_                                                                                                                History of Present Illness:  Daniel Davis is a pleasant 59 year old Afro-American male referred at the request of Dr. Elder Cyphers for evaluation of Hemoccult-positive stool.  This was noted on routine testing.  He has no GI complaints including change in bowel habits, abdominal pain or rectal bleeding.  He takes indomethacin only during a gout attack which is very infrequent.  Family history is pertinent for mother who had colon cancer.  Last colonoscopy in 2009 was pertinent for diverticulosis.  He takes iron daily.   Past Medical History  Diagnosis Date  . Arthritis   . Gout attack   . Hyperlipidemia   . Hypertension   . Colon polyps    Past Surgical History  Procedure Laterality Date  . Fracture surgery  2000    LEFT ankle, ORIF   family history includes Colon cancer (age of onset: 78) in his mother; Diabetes in his mother; Heart disease in his father; Hypertension in his father and mother; Stroke in his father. Current Outpatient Prescriptions  Medication Sig Dispense Refill  . allopurinol (ZYLOPRIM) 100 MG tablet Take 1 tablet (100 mg total) by mouth daily. 30 tablet 6  . aspirin 81 MG tablet Take 81 mg by mouth daily.    Marland Kitchen atorvastatin (LIPITOR) 20 MG tablet take 1 tablet by mouth at bedtime 90 tablet 1  . ferrous sulfate 325 (65 FE) MG tablet Take 325 mg by mouth daily with breakfast.    . indomethacin (INDOCIN) 50 MG capsule take 1 capsule by mouth three times a day with food for MILD PAIN(GOUT) 60 capsule 0  . lisinopril (PRINIVIL,ZESTRIL) 20 MG tablet Take 1 tablet (20 mg total) by mouth daily. 90 tablet 1   No current facility-administered medications for this visit.   Allergies as of 02/28/2015  . (No Known Allergies)    reports that he has never smoked. He has never used smokeless tobacco. He reports that he drinks about 16.0 oz of alcohol per week.  He reports that he does not use illicit drugs.   Review of Systems: Pertinent positive and negative review of systems were noted in the above HPI section. All other review of systems were otherwise negative.  Vital signs were reviewed in today's medical record Physical Exam: General: Well developed , well nourished, no acute distress Skin: anicteric Head: Normocephalic and atraumatic Eyes:  sclerae anicteric, EOMI Ears: Normal auditory acuity Mouth: No deformity or lesions Neck: Supple, no masses or thyromegaly Lymph Nodes: no lymphadenopathy Lungs: Clear throughout to auscultation Heart: Regular rate and rhythm; no murmurs, rubs or bruits Gastroinestinal: Soft, non tender and non distended. No masses, hepatosplenomegaly or hernias noted. Normal Bowel sounds Rectal:deferred Musculoskeletal: Symmetrical with no gross deformities  Skin: No lesions on visible extremities Pulses:  Normal pulses noted Extremities: No clubbing, cyanosis, edema or deformities noted Neurological: Alert oriented x  4, grossly nonfocal Cervical Nodes:  No significant cervical adenopathy Inguinal Nodes: No significant inguinal adenopathy Psychological:  Alert and cooperative. Normal mood and affect  See Assessment and Plan under Problem List

## 2015-02-28 NOTE — Assessment & Plan Note (Signed)
Hemoccult-positive stool.  Rule out GI bleeding sources including polyps, AVMs, neoplasm and a cold upper GI bleeding.  Recommendations #1 colonoscopy #2 follow-up Hemoccults of colonoscopy is negative #3 CBC  CC Dr. Elder Cyphers

## 2015-02-28 NOTE — Assessment & Plan Note (Signed)
Colonoscopy every 5 years

## 2015-02-28 NOTE — Patient Instructions (Signed)
Go to the basement for labs today  You have been scheduled for a colonoscopy. Please follow written instructions given to you at your visit today.  Please pick up your prep supplies at the pharmacy within the next 1-3 days. If you use inhalers (even only as needed), please bring them with you on the day of your procedure. Your physician has requested that you go to www.startemmi.com and enter the access code given to you at your visit today. This web site gives a general overview about your procedure. However, you should still follow specific instructions given to you by our office regarding your preparation for the procedure. 

## 2015-03-03 HISTORY — PX: COLONOSCOPY: SHX174

## 2015-03-05 ENCOUNTER — Encounter: Payer: Self-pay | Admitting: Gastroenterology

## 2015-03-12 ENCOUNTER — Encounter: Payer: Self-pay | Admitting: Gastroenterology

## 2015-03-12 ENCOUNTER — Ambulatory Visit (AMBULATORY_SURGERY_CENTER): Payer: 59 | Admitting: Gastroenterology

## 2015-03-12 VITALS — BP 106/67 | HR 88 | Temp 97.1°F | Resp 20 | Ht 70.0 in | Wt 229.0 lb

## 2015-03-12 DIAGNOSIS — D122 Benign neoplasm of ascending colon: Secondary | ICD-10-CM | POA: Diagnosis not present

## 2015-03-12 DIAGNOSIS — K5731 Diverticulosis of large intestine without perforation or abscess with bleeding: Secondary | ICD-10-CM

## 2015-03-12 DIAGNOSIS — K649 Unspecified hemorrhoids: Secondary | ICD-10-CM | POA: Diagnosis not present

## 2015-03-12 DIAGNOSIS — R195 Other fecal abnormalities: Secondary | ICD-10-CM | POA: Diagnosis not present

## 2015-03-12 DIAGNOSIS — K648 Other hemorrhoids: Secondary | ICD-10-CM

## 2015-03-12 MED ORDER — SODIUM CHLORIDE 0.9 % IV SOLN: 500.0000 mL | INTRAVENOUS | Status: DC

## 2015-03-12 NOTE — Patient Instructions (Signed)
YOU HAD AN ENDOSCOPIC PROCEDURE TODAY AT [facility]:   Refer to the procedure report that was given to you for any specific questions about what was found during the examination.  If the procedure report does not answer your questions, please call your gastroenterologist to clarify.  If you requested that your care partner not be given the details of your procedure findings, then the procedure report has been included in a sealed envelope for you to review at your convenience later.  YOU SHOULD EXPECT: Some feelings of bloating in the abdomen. Passage of more gas than usual.  Walking can help get rid of the air that was put into your GI tract during the procedure and reduce the bloating. If you had a lower endoscopy (such as a colonoscopy or flexible sigmoidoscopy) you may notice spotting of blood in your stool or on the toilet paper. If you underwent a bowel prep for your procedure, you may not have a normal bowel movement for a few days.  Please Note:  You might notice some irritation and congestion in your nose or some drainage.  This is from the oxygen used during your procedure.  There is no need for concern and it should clear up in a day or so.  SYMPTOMS TO REPORT IMMEDIATELY:   Following lower endoscopy (colonoscopy or flexible sigmoidoscopy):  Excessive amounts of blood in the stool  Significant tenderness or worsening of abdominal pains  Swelling of the abdomen that is new, acute  Fever of 100F or higher    For urgent or emergent issues, a gastroenterologist can be reached at any hour by calling [phone].   DIET: Your first meal following the procedure should be a small meal and then it is ok to progress to your normal diet. Heavy or fried foods are harder to digest and may make you feel nauseous or bloated.  Likewise, meals heavy in dairy and vegetables can increase bloating.  Drink plenty of fluids but you should avoid alcoholic beverages for 24  hours.  ACTIVITY:  You should plan to take it easy for the rest of today and you should NOT DRIVE or use heavy machinery until tomorrow (because of the sedation medicines used during the test).    FOLLOW UP: Our staff will call the number listed on your records the next business day following your procedure to check on you and address any questions or concerns that you may have regarding the information given to you following your procedure. If we do not reach you, we will leave a message.  However, if you are feeling well and you are not experiencing any problems, there is no need to return our call.  We will assume that you have returned to your regular daily activities without incident.  If any biopsies were taken you will be contacted by phone or by letter within the next 1-3 weeks.  Please call us at [phone] if you have not heard about the biopsies in 3 weeks.    SIGNATURES/CONFIDENTIALITY: You and/or your care partner have signed paperwork which will be entered into your electronic medical record.  These signatures attest to the fact that that the information above on your After Visit Summary has been reviewed and is understood.  Full responsibility of the confidentiality of this discharge information lies with you and/or your care-partner.   INFORMATION ON POLYPS,DIVERTICULOSIS,HEMORRHOIDS,& HIGH FIBER DIET GIVEN TO YOU TODAY  FOLLOW UP HEMOCCULT 10-14 DAYS (CARDS GIVEN TO YOU TODAY)

## 2015-03-12 NOTE — Progress Notes (Signed)
Called to room to assist during endoscopic procedure.  Patient ID and intended procedure confirmed with present staff. Received instructions for my participation in the procedure from the performing physician.  

## 2015-03-12 NOTE — Op Note (Signed)
[facility]  [address] [place], [address]   COLONOSCOPY PROCEDURE REPORT  PATIENT: Daniel, Davis  MR#: [id number] BIRTHDATE: 01/30/1956 , 26  yrs. old GENDER: male ENDOSCOPIST: Inda Castle, MD REFERRED XB:[id number] Greene, M.D. PROCEDURE DATE:  03/12/2015 PROCEDURE:   Colonoscopy, diagnostic and Colonoscopy with snare polypectomy First Screening Colonoscopy - Avg.  risk and is 50 yrs.  old or older - No.  Prior Negative Screening - Now for repeat screening. Other: See Comments  History of Adenoma - Now for follow-up colonoscopy & has been > or = to 3 yrs.  N/A  Polyps removed today? Yes ASA CLASS:   Class II INDICATIONS:Evaluation of unexplained GI bleeding and heme-positive stool. MEDICATIONS: Monitored anesthesia care, Propofol 300 mg IV, and Lidocaine 40 mg IV  DESCRIPTION OF PROCEDURE:   After the risks benefits and alternatives of the procedure were thoroughly explained, informed consent was obtained.  The digital rectal exam revealed no abnormalities of the rectum.   The LB [id number]  endoscope was introduced through the anus and advanced to the ileum. No adverse events experienced.   The quality of the prep was (Suprep was used) excellent.  The instrument was then slowly withdrawn as the colon was fully examined. Estimated blood loss is zero unless otherwise noted in this procedure report.      COLON FINDINGS: There was moderate diverticulosis noted in the sigmoid colon.   A sessile polyp measuring 3 mm in size was found in the ascending colon.  A polypectomy was performed with a cold snare.  The resection was complete, the polyp tissue was completely retrieved and sent to histology.   There was mild diverticulosis noted in the ascending colon, transverse colon, and descending colon.   Internal hemorrhoids were found.  Retroflexed views revealed no abnormalities. The time to cecum = 3.1 Withdrawal time = 7.1   The scope was withdrawn  and the procedure completed. COMPLICATIONS: There were no immediate complications.  ENDOSCOPIC IMPRESSION: 1.   Moderate diverticulosis was noted in the sigmoid colon 2.   Sessile polyp was found in the ascending colon; polypectomy was performed with a cold snare 3.   There was mild diverticulosis noted in the ascending colon, transverse colon, and descending colon 4.   Internal hemorrhoids  Internal hemorrhoids may be source for Hemoccult-positive stool  RECOMMENDATIONS: If the polyp(s) removed today are proven to be adenomatous (pre-cancerous) polyps, you will need a repeat colonoscopy in 5 years.  Otherwise you should continue to follow colorectal cancer screening guidelines for "routine risk" patients with colonoscopy in 10 years.  You will receive a letter within 1-2 weeks with the results of your biopsy as well as final recommendations.  Please call my office if you have not received a letter after 3 weeks. follow-up Hemoccults 10-14 days  eSigned:  Inda Castle, MD 03/12/2015 4:09 PM   cc:   PATIENT NAME:  Daniel, Davis MR#: [id number]

## 2015-03-12 NOTE — Progress Notes (Signed)
Stable to RR 

## 2015-03-14 ENCOUNTER — Telehealth: Payer: Self-pay

## 2015-03-14 NOTE — Telephone Encounter (Signed)
Left message on answering machine. 

## 2015-03-19 ENCOUNTER — Encounter: Payer: Self-pay | Admitting: Gastroenterology

## 2015-06-16 ENCOUNTER — Ambulatory Visit (INDEPENDENT_AMBULATORY_CARE_PROVIDER_SITE_OTHER): Payer: 59 | Admitting: Family Medicine

## 2015-06-16 ENCOUNTER — Encounter: Payer: Self-pay | Admitting: Family Medicine

## 2015-06-16 VITALS — BP 110/70 | HR 99 | Temp 97.9°F | Resp 16 | Ht 70.5 in | Wt 230.4 lb

## 2015-06-16 DIAGNOSIS — E785 Hyperlipidemia, unspecified: Secondary | ICD-10-CM

## 2015-06-16 DIAGNOSIS — M109 Gout, unspecified: Secondary | ICD-10-CM | POA: Diagnosis not present

## 2015-06-16 DIAGNOSIS — Z23 Encounter for immunization: Secondary | ICD-10-CM | POA: Diagnosis not present

## 2015-06-16 DIAGNOSIS — I10 Essential (primary) hypertension: Secondary | ICD-10-CM | POA: Diagnosis not present

## 2015-06-16 LAB — LIPID PANEL
Cholesterol: 157 mg/dL (ref 125–200)
HDL: 40 mg/dL (ref >=40)
LDL Cholesterol: 55 mg/dL (ref <130)
Total CHOL/HDL Ratio: 3.9 Ratio (ref <=5.0)
Triglycerides: 310 mg/dL — ABNORMAL HIGH (ref <150)
VLDL: 62 mg/dL — AB (ref <30)

## 2015-06-16 LAB — COMPLETE METABOLIC PANEL WITH GFR
ALT: 24 U/L (ref 9–46)
AST: 31 U/L (ref 10–35)
Albumin: 4.4 g/dL (ref 3.6–5.1)
Alkaline Phosphatase: 53 U/L (ref 40–115)
BUN: 11 mg/dL (ref 7–25)
CHLORIDE: 100 mmol/L (ref 98–110)
CO2: 24 mmol/L (ref 20–31)
Calcium: 9.9 mg/dL (ref 8.6–10.3)
Creat: 0.89 mg/dL (ref 0.70–1.33)
GFR, Est African American: >89 mL/min (ref >=60)
GFR, Est Non African American: >89 mL/min (ref >=60)
GLUCOSE: 100 mg/dL — AB (ref 65–99)
POTASSIUM: 4 mmol/L (ref 3.5–5.3)
Sodium: 137 mmol/L (ref 135–146)
Total Bilirubin: 0.5 mg/dL (ref 0.2–1.2)
Total Protein: 7.1 g/dL (ref 6.1–8.1)

## 2015-06-16 LAB — URIC ACID: URIC ACID, SERUM: 6.7 mg/dL (ref 4.0–7.8)

## 2015-06-16 MED ORDER — ALLOPURINOL 100 MG PO TABS: 100.0000 mg | ORAL_TABLET | Freq: Every day | ORAL | Status: DC

## 2015-06-16 MED ORDER — LISINOPRIL 20 MG PO TABS: 20.0000 mg | ORAL_TABLET | Freq: Every day | ORAL | Status: DC

## 2015-06-16 MED ORDER — ATORVASTATIN CALCIUM 20 MG PO TABS: ORAL_TABLET | ORAL | Status: DC

## 2015-06-16 NOTE — Progress Notes (Addendum)
Subjective:    Patient ID: Daniel Palms., male    DOB: 12/30/1955, 59 y.o.   MRN: [id number] This chart was scribed for Merri Ray, MD by Zola Button, Medical Scribe. This patient was seen in Room 25 and the patient's care was started at 1:32 PM.   HPI HPI Comments: Daniel Shawhan. is a 59 y.o. male who presents to the [facility] for a follow-up. Patient had some juice about 2 hours ago.   Hypertension: Last evaluated in May at physical. Stable at that time. Continued with lisinopril 20 mg qd. He checks his blood pressure occasionally. Patient denies any side effects. Lab Results  Component Value Date   CREATININE 0.99 10/15/2014    Hyperlipidemia: Plan on fasting labs today. Previously treated with Lipitor 20 mg qd. He has been doing fine with the Lipitor without any problems. Lab Results  Component Value Date   CHOL 161 10/15/2014   HDL 32* 10/15/2014   LDLCALC 74 10/15/2014   TRIG 276* 10/15/2014   CHOLHDL 5.0 10/15/2014    Lab Results  Component Value Date   ALT 19 10/15/2014   AST 19 10/15/2014   ALKPHOS 60 10/15/2014   BILITOT 0.6 10/15/2014    Gout: Restarted allopurinol 100 mg qd at last visit. Indomethacin if needed short-term. Uric acid was 6.7 in March of this year. He had been off of the allopurinol at last visit since stopping it with a recent gout flare 2 months prior. Had only been taking it 2 times per week when last seen. He has not had any flares since last visit. He has been drinking cranberry or cherry juice every morning. He has been taking the allopurinol once a day. Patient denies new rash, myalgias, or other new side effects with the allopurinol.  Patient enjoys hunting deer.  Patient Active Problem List   Diagnosis Date Noted  . Nonspecific abnormal finding in stool contents 02/28/2015  . Family history of cancer 02/28/2015  . OA (osteoarthritis) 02/28/2012  . HTN (hypertension) 11/27/2011  . Lipid disorder 11/27/2011  .  Gout attack 11/27/2011   Past Medical History  Diagnosis Date  . Arthritis   . Gout attack   . Hyperlipidemia   . Hypertension   . Colon polyps    Past Surgical History  Procedure Laterality Date  . Fracture surgery  2000    LEFT ankle, ORIF   No Known Allergies Prior to Admission medications   Medication Sig Start Date End Date Taking? Authorizing Provider  allopurinol (ZYLOPRIM) 100 MG tablet Take 1 tablet (100 mg total) by mouth daily. 12/16/14  Yes [name], MD  aspirin 81 MG tablet Take 81 mg by mouth daily.   Yes Historical Provider, MD  atorvastatin (LIPITOR) 20 MG tablet take 1 tablet by mouth at bedtime 12/16/14  Yes [name], MD  ferrous sulfate 325 (65 FE) MG tablet Take 325 mg by mouth daily with breakfast.   Yes Historical Provider, MD  indomethacin (INDOCIN) 50 MG capsule take 1 capsule by mouth three times a day with food for MILD PAIN(GOUT) 12/16/14  Yes [name], MD  lisinopril (PRINIVIL,ZESTRIL) 20 MG tablet Take 1 tablet (20 mg total) by mouth daily. 12/16/14  Yes [name], MD   Social History   Social History  . Marital Status: Married    Spouse Name: [name]  . Number of Children: 1  . Years of Education: 14+   Occupational History  .  SHIFT SUPERVISOR    Social History Main Topics  . Smoking status: Never Smoker   . Smokeless tobacco: Never Used  . Alcohol Use: 16.0 oz/week    32 Standard drinks or equivalent per week     Comment: 3-5 drinks  . Drug Use: No  . Sexual Activity:    Partners: Female   Other Topics Concern  . Not on file   Social History Narrative   Lives with his wife and their daughter.     Review of Systems  Constitutional: Negative for fatigue and unexpected weight change.  Eyes: Negative for visual disturbance.  Respiratory: Negative for cough, chest tightness and shortness of breath.   Cardiovascular: Negative for chest pain, palpitations and leg swelling.  Gastrointestinal: Negative for  abdominal pain and blood in stool.  Musculoskeletal: Negative for myalgias.  Skin: Negative for rash.  Neurological: Negative for dizziness, light-headedness and headaches.       Objective:   Physical Exam  Constitutional: He is oriented to person, place, and time. He appears well-developed and well-nourished.  HENT:  Head: Normocephalic and atraumatic.  Eyes: EOM are normal. Pupils are equal, round, and reactive to light.  Neck: No JVD present. Carotid bruit is not present.  Cardiovascular: Normal rate, regular rhythm and normal heart sounds.   No murmur heard. Pulmonary/Chest: Effort normal and breath sounds normal. He has no rales.  Musculoskeletal: He exhibits no edema.  Neurological: He is alert and oriented to person, place, and time.  Skin: Skin is warm and dry.  Psychiatric: He has a normal mood and affect.  Vitals reviewed.     Filed Vitals:   06/16/15 1318  BP: 110/70  Pulse: 99  Temp: 97.9 F (36.6 C)  TempSrc: Oral  Resp: 16  Height: 5' 10.5" (1.791 m)  Weight: 230 lb 6.4 oz (104.509 kg)  SpO2: 97%       Assessment & Plan:   Daniel Regehr. is a 59 y.o. male Essential hypertension - Plan: lisinopril (PRINIVIL,ZESTRIL) 20 MG tablet  -controlled. No change in meds for now.   -labs pending.   Flu vaccine need - Plan: Flu Vaccine QUAD 36+ mos IM given.  Gout of multiple sites, unspecified cause, unspecified chronicity - Plan: Uric Acid, allopurinol (ZYLOPRIM) 100 MG tablet  - improved. No recent flare, no side effects with allopurinol. Cont same dose and uric acid pending.   Hyperlipidemia - Plan: COMPLETE METABOLIC PANEL WITH GFR, Lipid panel, atorvastatin (LIPITOR) 20 MG tablet  -lipids pending. Not truly fasting (juice few hrs ago),  So may need repeat 8 hrs fasting if elevated. Cont lipitor same dose for now.  Meds ordered this encounter  Medications  . allopurinol (ZYLOPRIM) 100 MG tablet    Sig: Take 1 tablet (100 mg total) by mouth daily.     Dispense:  90 tablet    Refill:  1  . atorvastatin (LIPITOR) 20 MG tablet    Sig: take 1 tablet by mouth at bedtime    Dispense:  90 tablet    Refill:  1  . lisinopril (PRINIVIL,ZESTRIL) 20 MG tablet    Sig: Take 1 tablet (20 mg total) by mouth daily.    Dispense:  90 tablet    Refill:  1   Patient Instructions  You should receive a call or letter about your lab results within the next week to 10 days.  No change in meds for now.  Follow up in 6 months.  Return to the clinic or  go to the nearest emergency room if any of your symptoms worsen or new symptoms occur.     I personally performed the services described in this documentation, which was scribed in my presence. The recorded information has been reviewed and considered, and addended by me as needed.    By signing my name below, I, Zola Button, attest that this documentation has been prepared under the direction and in the presence of Merri Ray, MD.  Electronically Signed: Zola Button, Medical Scribe. 06/16/2015. 1:37 PM.

## 2015-06-16 NOTE — Patient Instructions (Signed)
You should receive a call or letter about your lab results within the next week to 10 days.  No change in meds for now.  Follow up in 6 months.  Return to the clinic or go to the nearest emergency room if any of your symptoms worsen or new symptoms occur.

## 2015-12-22 ENCOUNTER — Encounter: Payer: 59 | Admitting: Family Medicine

## 2015-12-24 ENCOUNTER — Encounter: Payer: Self-pay | Admitting: Family Medicine

## 2015-12-24 ENCOUNTER — Ambulatory Visit (INDEPENDENT_AMBULATORY_CARE_PROVIDER_SITE_OTHER): Payer: 59 | Admitting: Family Medicine

## 2015-12-24 VITALS — BP 114/80 | HR 81 | Temp 98.2°F | Resp 16 | Ht 70.5 in | Wt 225.4 lb

## 2015-12-24 DIAGNOSIS — Z125 Encounter for screening for malignant neoplasm of prostate: Secondary | ICD-10-CM

## 2015-12-24 DIAGNOSIS — M109 Gout, unspecified: Secondary | ICD-10-CM

## 2015-12-24 DIAGNOSIS — Z0000 Encounter for general adult medical examination without abnormal findings: Secondary | ICD-10-CM | POA: Diagnosis not present

## 2015-12-24 DIAGNOSIS — E785 Hyperlipidemia, unspecified: Secondary | ICD-10-CM | POA: Diagnosis not present

## 2015-12-24 DIAGNOSIS — I10 Essential (primary) hypertension: Secondary | ICD-10-CM

## 2015-12-24 DIAGNOSIS — Z114 Encounter for screening for human immunodeficiency virus [HIV]: Secondary | ICD-10-CM | POA: Diagnosis not present

## 2015-12-24 DIAGNOSIS — Z1159 Encounter for screening for other viral diseases: Secondary | ICD-10-CM

## 2015-12-24 LAB — COMPLETE METABOLIC PANEL WITH GFR
ALT: 21 U/L (ref 9–46)
AST: 23 U/L (ref 10–35)
Albumin: 4.4 g/dL (ref 3.6–5.1)
Alkaline Phosphatase: 51 U/L (ref 40–115)
BUN: 11 mg/dL (ref 7–25)
CHLORIDE: 98 mmol/L (ref 98–110)
CO2: 24 mmol/L (ref 20–31)
CREATININE: 0.87 mg/dL (ref 0.70–1.33)
Calcium: 9.7 mg/dL (ref 8.6–10.3)
GFR, Est African American: >89 mL/min (ref >=60)
GFR, Est Non African American: >89 mL/min (ref >=60)
GLUCOSE: 94 mg/dL (ref 65–99)
Potassium: 4.6 mmol/L (ref 3.5–5.3)
SODIUM: 132 mmol/L — AB (ref 135–146)
Total Bilirubin: 0.6 mg/dL (ref 0.2–1.2)
Total Protein: 7.2 g/dL (ref 6.1–8.1)

## 2015-12-24 LAB — LIPID PANEL
CHOL/HDL RATIO: 4.1 ratio (ref <=5.0)
Cholesterol: 163 mg/dL (ref 125–200)
HDL: 40 mg/dL (ref >=40)
LDL CALC: 43 mg/dL (ref <130)
Triglycerides: 399 mg/dL — ABNORMAL HIGH (ref <150)
VLDL: 80 mg/dL — ABNORMAL HIGH (ref <30)

## 2015-12-24 LAB — HIV ANTIBODY (ROUTINE TESTING W REFLEX): HIV 1&2 Ab, 4th Generation: NONREACTIVE

## 2015-12-24 LAB — POCT URINALYSIS DIP (MANUAL ENTRY)
BILIRUBIN UA: NEGATIVE
Glucose, UA: NEGATIVE
Ketones, POC UA: NEGATIVE
LEUKOCYTES UA: NEGATIVE
NITRITE UA: NEGATIVE
PROTEIN UA: NEGATIVE
Spec Grav, UA: 1.015
UROBILINOGEN UA: 0.2
pH, UA: 6.5

## 2015-12-24 LAB — HEPATITIS C ANTIBODY: HCV AB: NEGATIVE

## 2015-12-24 MED ORDER — LISINOPRIL 20 MG PO TABS: 20.0000 mg | ORAL_TABLET | Freq: Every day | ORAL | Status: DC

## 2015-12-24 MED ORDER — ALLOPURINOL 100 MG PO TABS: 100.0000 mg | ORAL_TABLET | Freq: Every day | ORAL | Status: DC

## 2015-12-24 MED ORDER — ATORVASTATIN CALCIUM 20 MG PO TABS: ORAL_TABLET | ORAL | Status: DC

## 2015-12-24 MED ORDER — INDOMETHACIN 50 MG PO CAPS: 50.0000 mg | ORAL_CAPSULE | Freq: Three times a day (TID) | ORAL | Status: DC | PRN

## 2015-12-24 NOTE — Progress Notes (Signed)
   Subjective:    Patient ID: Daniel Davis., male    DOB: Oct 22, 1955, 60 y.o.   MRN: [id number]  HPI    Review of Systems  Constitutional: Negative.   HENT: Negative.   Eyes: Negative.   Respiratory: Negative.   Cardiovascular: Negative.   Gastrointestinal: Negative.   Endocrine: Negative.   Genitourinary: Negative.   Musculoskeletal: Negative.   Skin: Negative.   Allergic/Immunologic: Negative.   Neurological: Negative.   Hematological: Negative.   Psychiatric/Behavioral: Negative.        Objective:   Physical Exam        Assessment & Plan:

## 2015-12-24 NOTE — Progress Notes (Addendum)
By signing my name below, I, Daniel Davis, attest that this documentation has been prepared under the direction and in the presence of Merri Ray, MD.  Electronically Signed: Verlee Monte, Medical Scribe. 12/24/2015. 10:14 AM. Subjective:    Patient ID: Daniel Palms., male    DOB: 1956-04-04, 60 y.o.   MRN: [id number]  HPI Chief Complaint  Patient presents with  . Annual Exam  . Medication Refill    all meds    HPI Comments: Daniel Greulich. is a 60 y.o. male with a PMHx of HTN, gout, hyperlipidemia and arthritis who presents to the [facility] for a complete physical exam. Pt states he's been doing all right.  CA Screening: Colon CA Screening: Pts last colonoscopy was in Aug 2016 - repeat in 5 years. He has a FHx of colon CA. Prostate Ca Screening: Nl PSA. Pt would like to get screened for prostate CA. Lab Results  Component Value Date   PSA 2.37 12/16/2014   PSA 1.96 11/26/2013   PSA 1.49 02/28/2012   Hepatitis/HIV Screening: Pt reports it's been a long time since he's been tested for hepatitis and HIV. Pt hasn't had any new STD checking. Pt would like to add this to his blood work.  Immunization: Immunization History  Administered Date(s) Administered  . Influenza Nasal 05/16/2014  . Influenza,inj,Quad PF,36+ Mos 06/16/2015  . Tdap 12/27/2007   Depression Screening: Depression screen [facility] 2/9 12/24/2015 06/16/2015 12/16/2014 11/26/2013  Decreased Interest 0 0 0 0  Down, Depressed, Hopeless 0 0 0 0  PHQ - 2 Score 0 0 0 0    Dentist: Pt has a dentist.  Exercise: Pt reports doing a lot of walking and tries to do a little bit of walking every day. Pt reports doing a little bit of weight lifting.  HTN: Pt is on lisinopril 20 mg QD, and 81 mg of ASA. Pt denies side affect on his bp medication.   Hyperlipidemia: Pt takes lipitor 20 mg QD. Pt denies any new side affects on his cholesterol medications. Lab Results  Component Value Date   CHOL 157  06/16/2015   HDL 40 06/16/2015   LDLCALC 55 06/16/2015   TRIG 310* 06/16/2015   CHOLHDL 3.9 06/16/2015   Lab Results  Component Value Date   ALT 24 06/16/2015   AST 31 06/16/2015   ALKPHOS 53 06/16/2015   BILITOT 0.5 06/16/2015   Gout: He's on allopurinol 100 mg QD, and indocin if needed for attack. PT states he's had a small amount of edema in his foot. Pt reports drinking cherry and crandberry juice daily. Pt takes indomethacin as needed. Lab Results  Component Value Date   LABURIC 6.7 06/16/2015    Patient Active Problem List   Diagnosis Date Noted  . Nonspecific abnormal finding in stool contents 02/28/2015  . Family history of cancer 02/28/2015  . OA (osteoarthritis) 02/28/2012  . HTN (hypertension) 11/27/2011  . Lipid disorder 11/27/2011  . Gout attack 11/27/2011   Past Medical History  Diagnosis Date  . Arthritis   . Gout attack   . Hyperlipidemia   . Hypertension   . Colon polyps    Past Surgical History  Procedure Laterality Date  . Fracture surgery  2000    LEFT ankle, ORIF   No Known Allergies Prior to Admission medications   Medication Sig Start Date End Date Taking? Authorizing Provider  allopurinol (ZYLOPRIM) 100 MG tablet Take 1 tablet (100 mg total) by mouth daily. 06/16/15  Yes [name], MD  aspirin 81 MG tablet Take 81 mg by mouth daily.   Yes Historical Provider, MD  atorvastatin (LIPITOR) 20 MG tablet take 1 tablet by mouth at bedtime 06/16/15  Yes [name], MD  ferrous sulfate 325 (65 FE) MG tablet Take 325 mg by mouth daily with breakfast.   Yes Historical Provider, MD  indomethacin (INDOCIN) 50 MG capsule take 1 capsule by mouth three times a day with food for MILD PAIN(GOUT) 12/16/14  Yes [name], MD  lisinopril (PRINIVIL,ZESTRIL) 20 MG tablet Take 1 tablet (20 mg total) by mouth daily. 06/16/15  Yes [name], MD   Social History   Social History  . Marital Status: Married    Spouse Name: [name]  . Number  of Children: 1  . Years of Education: 14+   Occupational History  . SHIFT SUPERVISOR    Social History Main Topics  . Smoking status: Never Smoker   . Smokeless tobacco: Never Used  . Alcohol Use: 1.8 oz/week    3 Standard drinks or equivalent per week     Comment: 3-5 drinks  . Drug Use: No  . Sexual Activity:    Partners: Female   Other Topics Concern  . Not on file   Social History Narrative   Lives with his wife and their daughter.   Exercise: Walking   Education: some College   Review of Systems 13 point ROS was negative. Objective:  BP 114/80 mmHg  Pulse 81  Temp(Src) 98.2 F (36.8 C) (Oral)  Resp 16  Ht 5' 10.5" (1.791 m)  Wt 225 lb 6.4 oz (102.241 kg)  BMI 31.87 kg/m2  SpO2 98%  Physical Exam  Constitutional: He is oriented to person, place, and time. He appears well-developed and well-nourished.  HENT:  Head: Normocephalic and atraumatic.  Right Ear: External ear normal.  Left Ear: External ear normal.  Mouth/Throat: Oropharynx is clear and moist.  Eyes: Conjunctivae and EOM are normal. Pupils are equal, round, and reactive to light.  Neck: Normal range of motion. Neck supple. No thyromegaly present.  Cardiovascular: Normal rate, regular rhythm, normal heart sounds and intact distal pulses.   Pulmonary/Chest: Effort normal and breath sounds normal. No respiratory distress. He has no wheezes.  Abdominal: Soft. He exhibits no distension. There is no tenderness. Hernia confirmed negative in the right inguinal area and confirmed negative in the left inguinal area.  Genitourinary: Prostate normal.  Musculoskeletal: Normal range of motion. He exhibits no edema or tenderness.  Lymphadenopathy:    He has no cervical adenopathy.  Neurological: He is alert and oriented to person, place, and time. He has normal reflexes.  Skin: Skin is warm and dry.  Psychiatric: He has a normal mood and affect. His behavior is normal.  Vitals reviewed.     Results for orders  placed or performed in visit on 12/24/15  POCT urinalysis dipstick  Result Value Ref Range   Color, UA yellow yellow   Clarity, UA clear clear   Glucose, UA negative negative   Bilirubin, UA negative negative   Ketones, POC UA negative negative   Spec Grav, UA 1.015    Blood, UA small (A) negative   pH, UA 6.5    Protein Ur, POC negative negative   Urobilinogen, UA 0.2    Nitrite, UA Negative Negative   Leukocytes, UA Negative Negative   Assessment & Plan:   Daniel Westgate. is a 60 y.o. male Annual physical  exam  --anticipatory guidance as below in AVS, screening labs above. Health maintenance items as above in HPI discussed/recommended as applicable.   Essential hypertension - Plan: COMPLETE METABOLIC PANEL WITH GFR, lisinopril (PRINIVIL,ZESTRIL) 20 MG tablet, POCT urinalysis dipstick  - Overall stable, continue same dose of lisinopril.  Gout of multiple sites, unspecified cause, unspecified chronicity - Plan: indomethacin (INDOCIN) 50 MG capsule, allopurinol (ZYLOPRIM) 100 MG tablet  - Continue allopurinol, indomethacin if needed.  Hyperlipidemia - Plan: COMPLETE METABOLIC PANEL WITH GFR, Lipid panel, atorvastatin (LIPITOR) 20 MG tablet  Labs pending, continue Lipitor same dose.  Screening for HIV (human immunodeficiency virus) - Plan: HIV antibody  Need for hepatitis C screening test - Plan: Hepatitis C antibody  Screening for prostate cancer - Plan: PSA  -We discussed pros and cons of prostate cancer screening, and after this discussion, he chose to have screening done. PSA obtained, and no concerning findings on DRE.    Meds ordered this encounter  Medications  . lisinopril (PRINIVIL,ZESTRIL) 20 MG tablet    Sig: Take 1 tablet (20 mg total) by mouth daily.    Dispense:  90 tablet    Refill:  1  . indomethacin (INDOCIN) 50 MG capsule    Sig: Take 1 capsule (50 mg total) by mouth 3 (three) times daily as needed. for MILD PAIN(GOUT)    Dispense:  60 capsule     Refill:  0  . atorvastatin (LIPITOR) 20 MG tablet    Sig: take 1 tablet by mouth at bedtime    Dispense:  90 tablet    Refill:  1  . allopurinol (ZYLOPRIM) 100 MG tablet    Sig: Take 1 tablet (100 mg total) by mouth daily.    Dispense:  90 tablet    Refill:  1   Patient Instructions     IF you received an x-ray today, you will receive an invoice from [facility]. Please contact [facility] at [phone] with questions or concerns regarding your invoice.   IF you received labwork today, you will receive an invoice from [employer]. Please contact [facility] at [phone] with questions or concerns regarding your invoice.   Our billing staff will not be able to assist you with questions regarding bills from these companies.  You will be contacted with the lab results as soon as they are available. The fastest way to get your results is to activate your My Chart account. Instructions are located on the last page of this paperwork. If you have not heard from [place] regarding the results in 2 weeks, please contact this office.    No change in meds for now.   Keeping you healthy  Get these tests  Blood pressure- Have your blood pressure checked once a year by your healthcare provider.  Normal blood pressure is 120/80  Weight- Have your body mass index (BMI) calculated to screen for obesity.  BMI is a measure of body fat based on height and weight. You can also calculate your own BMI at [employer].  Cholesterol- Have your cholesterol checked every year.  Diabetes- Have your blood sugar checked regularly if you have high blood pressure, high cholesterol, have a family history of diabetes or if you are overweight.  Screening for Colon Cancer- Colonoscopy starting at age 45.  Screening may begin sooner depending on your family history and other health conditions. Follow up colonoscopy as directed by your  Gastroenterologist.  Screening for Prostate Cancer- Both blood work (PSA) and a rectal  exam help screen for Prostate Cancer.  Screening begins at age 21 with African-American men and at age 35 with Caucasian men.  Screening may begin sooner depending on your family history.  Take these medicines  Aspirin- One aspirin daily can help prevent Heart disease and Stroke.  Flu shot- Every fall.  Tetanus- Every 10 years.  Zostavax- Once after the age of 56 to prevent Shingles.  Pneumonia shot- Once after the age of 48; if you are younger than 36, ask your healthcare provider if you need a Pneumonia shot.  Take these steps  Don't smoke- If you do smoke, talk to your doctor about quitting.  For tips on how to quit, go to www.smokefree.gov or call 1-800-QUIT-NOW.  Be physically active- Exercise 5 days a week for at least 30 minutes.  If you are not already physically active start slow and gradually work up to 30 minutes of moderate physical activity.  Examples of moderate activity include walking briskly, mowing the yard, dancing, swimming, bicycling, etc.  Eat a healthy diet- Eat a variety of healthy food such as fruits, vegetables, low fat milk, low fat cheese, yogurt, lean meant, poultry, fish, beans, tofu, etc. For more information go to www.thenutritionsource.org  Drink alcohol in moderation- Limit alcohol intake to less than two drinks a day. Never drink and drive.  Dentist- Brush and floss twice daily; visit your dentist twice a year.  Depression- Your emotional health is as important as your physical health. If you're feeling down, or losing interest in things you would normally enjoy please talk to your healthcare provider.  Eye exam- Visit your eye doctor every year.  Safe sex- If you may be exposed to a sexually transmitted infection, use a condom.  Seat belts- Seat belts can save your life; always wear one.  Smoke/Carbon Monoxide detectors- These detectors need to be installed on  the appropriate level of your home.  Replace batteries at least once a year.  Skin cancer- When out in the sun, cover up and use sunscreen 15 SPF or higher.  Violence- If anyone is threatening you, please tell your healthcare provider.  Living Will/ Health care power of attorney- Speak with your healthcare provider and family.    I personally performed the services described in this documentation, which was scribed in my presence. The recorded information has been reviewed and considered, and addended by me as needed.

## 2015-12-24 NOTE — Patient Instructions (Addendum)
IF you received an x-ray today, you will receive an invoice from [facility]. Please contact [facility] at [phone] with questions or concerns regarding your invoice.   IF you received labwork today, you will receive an invoice from [employer]. Please contact [facility] at [phone] with questions or concerns regarding your invoice.   Our billing staff will not be able to assist you with questions regarding bills from these companies.  You will be contacted with the lab results as soon as they are available. The fastest way to get your results is to activate your My Chart account. Instructions are located on the last page of this paperwork. If you have not heard from [place] regarding the results in 2 weeks, please contact this office.    No change in meds for now.   Keeping you healthy  Get these tests  Blood pressure- Have your blood pressure checked once a year by your healthcare provider.  Normal blood pressure is 120/80  Weight- Have your body mass index (BMI) calculated to screen for obesity.  BMI is a measure of body fat based on height and weight. You can also calculate your own BMI at ViewBanking.[email or web address].  Cholesterol- Have your cholesterol checked every year.  Diabetes- Have your blood sugar checked regularly if you have high blood pressure, high cholesterol, have a family history of diabetes or if you are overweight.  Screening for Colon Cancer- Colonoscopy starting at age 50.  Screening may begin sooner depending on your family history and other health conditions. Follow up colonoscopy as directed by your Gastroenterologist.  Screening for Prostate Cancer- Both blood work (PSA) and a rectal exam help screen for Prostate Cancer.  Screening begins at age 35 with African-American men and at age 85 with Caucasian men.  Screening may begin sooner depending on your family history.  Take these medicines  Aspirin- One aspirin  daily can help prevent Heart disease and Stroke.  Flu shot- Every fall.  Tetanus- Every 10 years.  Zostavax- Once after the age of 25 to prevent Shingles.  Pneumonia shot- Once after the age of 72; if you are younger than 52, ask your healthcare provider if you need a Pneumonia shot.  Take these steps  Don't smoke- If you do smoke, talk to your doctor about quitting.  For tips on how to quit, go to www.smokefree.gov or call 1-800-QUIT-NOW.  Be physically active- Exercise 5 days a week for at least 30 minutes.  If you are not already physically active start slow and gradually work up to 30 minutes of moderate physical activity.  Examples of moderate activity include walking briskly, mowing the yard, dancing, swimming, bicycling, etc.  Eat a healthy diet- Eat a variety of healthy food such as fruits, vegetables, low fat milk, low fat cheese, yogurt, lean meant, poultry, fish, beans, tofu, etc. For more information go to www.thenutritionsource.org  Drink alcohol in moderation- Limit alcohol intake to less than two drinks a day. Never drink and drive.  Dentist- Brush and floss twice daily; visit your dentist twice a year.  Depression- Your emotional health is as important as your physical health. If you're feeling down, or losing interest in things you would normally enjoy please talk to your healthcare provider.  Eye exam- Visit your eye doctor every year.  Safe sex- If you may be exposed to a sexually transmitted infection, use a condom.  Seat belts- Seat belts can save your life; always wear one.  Smoke/Carbon Building services engineer- These  detectors need to be installed on the appropriate level of your home.  Replace batteries at least once a year.  Skin cancer- When out in the sun, cover up and use sunscreen 15 SPF or higher.  Violence- If anyone is threatening you, please tell your healthcare provider.  Living Will/ Health care power of attorney- Speak with your healthcare provider  and family.

## 2015-12-25 LAB — PSA: PSA: 2.6 ng/mL (ref <=4.00)

## 2015-12-26 ENCOUNTER — Encounter: Payer: Self-pay | Admitting: Family Medicine

## 2015-12-27 ENCOUNTER — Encounter: Payer: Self-pay | Admitting: Family Medicine

## 2016-01-11 ENCOUNTER — Other Ambulatory Visit: Payer: Self-pay | Admitting: Family Medicine

## 2016-01-11 DIAGNOSIS — E871 Hypo-osmolality and hyponatremia: Secondary | ICD-10-CM

## 2016-01-13 ENCOUNTER — Telehealth: Payer: Self-pay | Admitting: Emergency Medicine

## 2016-01-13 NOTE — Telephone Encounter (Signed)
Pt given lab results and informed to return to clinic in 1 week for repeat bloodwork.

## 2016-01-13 NOTE — Telephone Encounter (Signed)
-----   Message from [name], MD sent at 01/11/2016  4:43 PM EDT ----- Call patient: HIV test nonreactive, hepatitis C test was negative. Prostate level stable, normal. Urine test in office did have small amount of blood, but it appears he has also had this in the past, without recent change. If this has not been discussed previously, can follow-up to discuss evaluation options.  Sodium was slightly low at 132. Would like to recheck this with lab only visit in the next week or 2 if possible. I will place a lab only order. Other electrolytes looked okay. Triglycerides elevated, but other cholesterol numbers looked okay. No change in Lipitor for now. Let me know if there are any questions.

## 2016-07-08 ENCOUNTER — Ambulatory Visit (INDEPENDENT_AMBULATORY_CARE_PROVIDER_SITE_OTHER): Payer: 59 | Admitting: Family Medicine

## 2016-07-08 ENCOUNTER — Encounter: Payer: Self-pay | Admitting: Family Medicine

## 2016-07-08 VITALS — BP 134/82 | HR 87 | Temp 99.0°F | Resp 16 | Ht 70.5 in | Wt 212.4 lb

## 2016-07-08 DIAGNOSIS — E785 Hyperlipidemia, unspecified: Secondary | ICD-10-CM | POA: Diagnosis not present

## 2016-07-08 DIAGNOSIS — M109 Gout, unspecified: Secondary | ICD-10-CM

## 2016-07-08 DIAGNOSIS — I10 Essential (primary) hypertension: Secondary | ICD-10-CM | POA: Diagnosis not present

## 2016-07-08 DIAGNOSIS — E871 Hypo-osmolality and hyponatremia: Secondary | ICD-10-CM

## 2016-07-08 MED ORDER — LISINOPRIL 20 MG PO TABS: 20.0000 mg | ORAL_TABLET | Freq: Every day | ORAL | 1 refills | Status: DC

## 2016-07-08 MED ORDER — ALLOPURINOL 100 MG PO TABS: 100.0000 mg | ORAL_TABLET | Freq: Every day | ORAL | 1 refills | Status: DC

## 2016-07-08 MED ORDER — ATORVASTATIN CALCIUM 20 MG PO TABS: ORAL_TABLET | ORAL | 1 refills | Status: DC

## 2016-07-08 NOTE — Progress Notes (Addendum)
By signing my name below, I, Mesha Guinyard, attest that this documentation has been prepared under the direction and in the presence of Merri Ray, MD.  Electronically Signed: Verlee Monte, Medical Scribe. 07/08/16. 8:17 AM.  Subjective:    Patient ID: Daniel Palms., male    DOB: Dec 04, 1955, 60 y.o.   MRN: [id number]  HPI Chief Complaint  Patient presents with  . Hypertension    HPI Comments: Daniel Mcnell. is a 60 y.o. male with a PMHx of HLD, HTN, and gout who presents to the [facility] for HTN follow-up. Last seen in May for his physical. Pt is fasting. He has been intentionally losing weight.  HTN: Lisinopril 20mg  QD, ASA 81mg  QD. He has been compliant with lisinopril. Deneis experiencing negative side effects such as chest pain, light-headedness, dizziness, abdominal pain, and other unwanted sxs. Lab Results  Component Value Date   CREATININE 0.87 12/24/2015   BP Readings from Last 3 Encounters:  07/08/16 134/82  12/24/15 114/80  06/16/15 110/70   Hyponatremia: Sodium was 132 in May. Advised to return for lab only visit, which he has not done yet. Denies getting his sodium checked outside the office.   HLD: Takes lipitor 20mg  QD. Continued on same dose of lipitor. Has been compliant with lipitor and denies experiencing any negative side effects such as myalgias, and other unwanted sxs. Lab Results  Component Value Date   CHOL 163 12/24/2015   HDL 40 12/24/2015   LDLCALC 43 12/24/2015   TRIG 399 (H) 12/24/2015   CHOLHDL 4.1 12/24/2015   Lab Results  Component Value Date   ALT 21 12/24/2015   AST 23 12/24/2015   ALKPHOS 51 12/24/2015   BILITOT 0.6 12/24/2015   Wt Readings from Last 3 Encounters:  07/08/16 212 lb 6.4 oz (96.3 kg)  12/24/15 225 lb 6.4 oz (102.2 kg)  06/16/15 230 lb 6.4 oz (104.5 kg)   Gout: Last uric acid was overall nl 06/2015; level of 6.7. He takes allopurinol 100mg  QD, indocin PRN Last flare was mild described  as a "tingle" in his foot 2-3 months ago and was relieved after taking 2 indocin tablets. Denies new rashes, and other unwanted sxs.  Patient Active Problem List   Diagnosis Date Noted  . Nonspecific abnormal finding in stool contents 02/28/2015  . Family history of cancer 02/28/2015  . OA (osteoarthritis) 02/28/2012  . HTN (hypertension) 11/27/2011  . Lipid disorder 11/27/2011  . Gout attack 11/27/2011   Past Medical History:  Diagnosis Date  . Arthritis   . Colon polyps   . Gout attack   . Hyperlipidemia   . Hypertension    Past Surgical History:  Procedure Laterality Date  . FRACTURE SURGERY  2000   LEFT ankle, ORIF   No Known Allergies Prior to Admission medications   Medication Sig Start Date End Date Taking? Authorizing Provider  allopurinol (ZYLOPRIM) 100 MG tablet Take 1 tablet (100 mg total) by mouth daily. 12/24/15  Yes [name], MD  aspirin 81 MG tablet Take 81 mg by mouth daily.   Yes Historical Provider, MD  atorvastatin (LIPITOR) 20 MG tablet take 1 tablet by mouth at bedtime 12/24/15  Yes [name], MD  ferrous sulfate 325 (65 FE) MG tablet Take 325 mg by mouth daily with breakfast.   Yes Historical Provider, MD  indomethacin (INDOCIN) 50 MG capsule Take 1 capsule (50 mg total) by mouth 3 (three) times daily as needed. for MILD  PAIN(GOUT) 12/24/15  Yes [name], MD  lisinopril (PRINIVIL,ZESTRIL) 20 MG tablet Take 1 tablet (20 mg total) by mouth daily. 12/24/15  Yes [name], MD   Social History   Social History  . Marital status: Married    Spouse name: [name]  . Number of children: 1  . Years of education: 14+   Occupational History  . SHIFT SUPERVISOR [employer]   Social History Main Topics  . Smoking status: Never Smoker  . Smokeless tobacco: Never Used  . Alcohol use 1.8 oz/week    3 Standard drinks or equivalent per week     Comment: 3-5 drinks  . Drug use: No  . Sexual activity: Yes    Partners: Female    Other Topics Concern  . Not on file   Social History Narrative   Lives with his wife and their daughter.   Exercise: Walking   Education: some College   Review of Systems  Constitutional: Negative for fatigue and unexpected weight change.  Eyes: Negative for visual disturbance.  Respiratory: Negative for cough, chest tightness and shortness of breath.   Cardiovascular: Negative for chest pain, palpitations and leg swelling.  Gastrointestinal: Negative for abdominal pain and blood in stool.  Musculoskeletal: Negative for myalgias.  Skin: Negative for rash.  Neurological: Negative for dizziness, light-headedness and headaches.   Objective:  Physical Exam  Constitutional: He is oriented to person, place, and time. He appears well-developed and well-nourished.  HENT:  Head: Normocephalic and atraumatic.  Eyes: EOM are normal. Pupils are equal, round, and reactive to light.  Neck: No JVD present. Carotid bruit is not present.  Cardiovascular: Normal rate, regular rhythm and normal heart sounds.  Exam reveals no friction rub.   No murmur heard. Pulmonary/Chest: Effort normal and breath sounds normal. No respiratory distress. He has no wheezes. He has no rales.  Abdominal: There is no tenderness.  Musculoskeletal: He exhibits no edema.  Neurological: He is alert and oriented to person, place, and time.  Skin: Skin is warm and dry.  Psychiatric: He has a normal mood and affect.  Vitals reviewed.  BP 134/82   Pulse 87   Temp 99 F (37.2 C) (Oral)   Resp 16   Ht 5' 10.5" (1.791 m)   Wt 212 lb 6.4 oz (96.3 kg)   SpO2 95%   BMI 30.05 kg/m  Assessment & Plan:   Daniel Mccumbers. is a 60 y.o. male Essential hypertension - Plan: Comprehensive metabolic panel, lisinopril (PRINIVIL,ZESTRIL) 20 MG tablet  - controlled. No med changes for now.   Gout of multiple sites, unspecified cause, unspecified chronicity - Plan: Uric Acid, allopurinol (ZYLOPRIM) 100 MG tablet  - overall  controlled. One mild flare. Check uric acid, continue allopurinol same dose.   Hyperlipidemia, unspecified hyperlipidemia type - Plan: Lipid panel, atorvastatin (LIPITOR) 20 MG tablet  - labs pending. Cont lipitor same dose, expect trigs to improve with wt. loss.   Hyponatremia - Plan: Comprehensive metabolic panel  - repeat testing. Asymptomatic.   Meds ordered this encounter  Medications  . lisinopril (PRINIVIL,ZESTRIL) 20 MG tablet    Sig: Take 1 tablet (20 mg total) by mouth daily.    Dispense:  90 tablet    Refill:  1  . atorvastatin (LIPITOR) 20 MG tablet    Sig: take 1 tablet by mouth at bedtime    Dispense:  90 tablet    Refill:  1  . allopurinol (ZYLOPRIM) 100 MG tablet  Sig: Take 1 tablet (100 mg total) by mouth daily.    Dispense:  90 tablet    Refill:  1   Patient Instructions    Congratulations on the weight loss. Continue the good work with diet and exercise. No change in medications for now. If the sodium level is low again, I can let you know on other plans or possible medication changes. Follow-up in 6 months for physical.   IF you received an x-ray today, you will receive an invoice from [facility]. Please contact [facility] at [phone] with questions or concerns regarding your invoice.   IF you received labwork today, you will receive an invoice from [employer]. Please contact [facility] at [phone] with questions or concerns regarding your invoice.   Our billing staff will not be able to assist you with questions regarding bills from these companies.  You will be contacted with the lab results as soon as they are available. The fastest way to get your results is to activate your My Chart account. Instructions are located on the last page of this paperwork. If you have not heard from [place] regarding the results in 2 weeks, please contact this office.       I personally performed the services described in  this documentation, which was scribed in my presence. The recorded information has been reviewed and considered, and addended by me as needed.   Signed,   Merri Ray, MD [facility].  07/08/16 8:25 AM

## 2016-07-08 NOTE — Patient Instructions (Addendum)
  Congratulations on the weight loss. Continue the good work with diet and exercise. No change in medications for now. If the sodium level is low again, I can let you know on other plans or possible medication changes. Follow-up in 6 months for physical.   IF you received an x-ray today, you will receive an invoice from [facility]. Please contact [facility] at [phone] with questions or concerns regarding your invoice.   IF you received labwork today, you will receive an invoice from [employer]. Please contact [facility] at [phone] with questions or concerns regarding your invoice.   Our billing staff will not be able to assist you with questions regarding bills from these companies.  You will be contacted with the lab results as soon as they are available. The fastest way to get your results is to activate your My Chart account. Instructions are located on the last page of this paperwork. If you have not heard from [place] regarding the results in 2 weeks, please contact this office.

## 2016-07-09 LAB — COMPREHENSIVE METABOLIC PANEL
A/G RATIO: 1.6 (ref 1.2–2.2)
ALT: 13 IU/L (ref 0–44)
AST: 25 IU/L (ref 0–40)
Albumin: 4.2 g/dL (ref 3.5–5.5)
Alkaline Phosphatase: 58 IU/L (ref 39–117)
BUN/Creatinine Ratio: 14 (ref 9–20)
BUN: 13 mg/dL (ref 6–24)
Bilirubin Total: 0.3 mg/dL (ref 0.0–1.2)
CALCIUM: 9.5 mg/dL (ref 8.7–10.2)
CO2: 22 mmol/L (ref 18–29)
Chloride: 100 mmol/L (ref 96–106)
Creatinine, Ser: 0.96 mg/dL (ref 0.76–1.27)
GFR calc Af Amer: 100 mL/min/{1.73_m2} (ref >59)
GFR, EST NON AFRICAN AMERICAN: 87 mL/min/{1.73_m2} (ref >59)
GLUCOSE: 85 mg/dL (ref 65–99)
Globulin, Total: 2.6 g/dL (ref 1.5–4.5)
POTASSIUM: 4.3 mmol/L (ref 3.5–5.2)
Sodium: 136 mmol/L (ref 134–144)
Total Protein: 6.8 g/dL (ref 6.0–8.5)

## 2016-07-09 LAB — LIPID PANEL
CHOL/HDL RATIO: 3.5 ratio (ref 0.0–5.0)
Cholesterol, Total: 174 mg/dL (ref 100–199)
HDL: 50 mg/dL (ref >39)
LDL Calculated: 84 mg/dL (ref 0–99)
TRIGLYCERIDES: 202 mg/dL — AB (ref 0–149)
VLDL CHOLESTEROL CAL: 40 mg/dL (ref 5–40)

## 2016-07-09 LAB — URIC ACID: Uric Acid: 5.5 mg/dL (ref 3.7–8.6)

## 2016-07-23 ENCOUNTER — Encounter: Payer: Self-pay | Admitting: *Deleted

## 2016-11-25 ENCOUNTER — Encounter: Payer: 59 | Admitting: Family Medicine

## 2016-12-16 ENCOUNTER — Ambulatory Visit (INDEPENDENT_AMBULATORY_CARE_PROVIDER_SITE_OTHER): Payer: 59 | Admitting: Family Medicine

## 2016-12-16 ENCOUNTER — Encounter: Payer: Self-pay | Admitting: Family Medicine

## 2016-12-16 VITALS — BP 137/87 | HR 85 | Temp 98.0°F | Resp 18 | Ht 69.09 in | Wt 210.0 lb

## 2016-12-16 DIAGNOSIS — M109 Gout, unspecified: Secondary | ICD-10-CM | POA: Diagnosis not present

## 2016-12-16 DIAGNOSIS — R3129 Other microscopic hematuria: Secondary | ICD-10-CM | POA: Diagnosis not present

## 2016-12-16 DIAGNOSIS — Z0000 Encounter for general adult medical examination without abnormal findings: Secondary | ICD-10-CM

## 2016-12-16 DIAGNOSIS — E785 Hyperlipidemia, unspecified: Secondary | ICD-10-CM

## 2016-12-16 DIAGNOSIS — I10 Essential (primary) hypertension: Secondary | ICD-10-CM

## 2016-12-16 LAB — POCT URINALYSIS DIP (MANUAL ENTRY)
BILIRUBIN UA: NEGATIVE
BILIRUBIN UA: NEGATIVE mg/dL
GLUCOSE UA: NEGATIVE mg/dL
Leukocytes, UA: NEGATIVE
NITRITE UA: NEGATIVE
PH UA: 5.5 (ref 5.0–8.0)
PROTEIN UA: NEGATIVE mg/dL
Spec Grav, UA: 1.02 (ref 1.010–1.025)
Urobilinogen, UA: 0.2 E.U./dL

## 2016-12-16 MED ORDER — ATORVASTATIN CALCIUM 20 MG PO TABS: ORAL_TABLET | ORAL | 1 refills | Status: DC

## 2016-12-16 MED ORDER — LISINOPRIL 20 MG PO TABS: 20.0000 mg | ORAL_TABLET | Freq: Every day | ORAL | 1 refills | Status: DC

## 2016-12-16 MED ORDER — ALLOPURINOL 100 MG PO TABS: 100.0000 mg | ORAL_TABLET | Freq: Every day | ORAL | 1 refills | Status: DC

## 2016-12-16 NOTE — Progress Notes (Signed)
   Subjective:    Patient ID: Daniel Palms., male    DOB: 1956/02/15, 61 y.o.   MRN: [id number]  HPI    Review of Systems  Constitutional: Negative.   HENT: Negative.   Eyes: Negative.   Respiratory: Negative.   Cardiovascular: Negative.   Gastrointestinal: Negative.   Endocrine: Negative.   Genitourinary: Negative.   Musculoskeletal: Negative.   Skin: Negative.   Allergic/Immunologic: Negative.   Neurological: Negative.   Hematological: Negative.   Psychiatric/Behavioral: Negative.        Objective:   Physical Exam        Assessment & Plan:

## 2016-12-16 NOTE — Patient Instructions (Addendum)
Follow up with eye care provider as discussed.   If blood still in urine, it may be helpful to meet with urologist and I will refer you. I did not feel a nodule on prostate exam, but it did feel firm on both sides. That can also be rechecked with urologist.   No change in meds for now. Recheck in 6 months.     Hematuria, Adult Hematuria is blood in your urine. It can be caused by a bladder infection, kidney infection, prostate infection, kidney stone, or cancer of your urinary tract. Infections can usually be treated with medicine, and a kidney stone usually will pass through your urine. If neither of these is the cause of your hematuria, further workup to find out the reason may be needed. It is very important that you tell your health care provider about any blood you see in your urine, even if the blood stops without treatment or happens without causing pain. Blood in your urine that happens and then stops and then happens again can be a symptom of a very serious condition. Also, pain is not a symptom in the initial stages of many urinary cancers. Follow these instructions at home:  Drink lots of fluid, 3-4 quarts a day. If you have been diagnosed with an infection, cranberry juice is especially recommended, in addition to large amounts of water.  Avoid caffeine, tea, and carbonated beverages because they tend to irritate the bladder.  Avoid alcohol because it may irritate the prostate.  Take all medicines as directed by your health care provider.  If you were prescribed an antibiotic medicine, finish it all even if you start to feel better.  If you have been diagnosed with a kidney stone, follow your health care provider's instructions regarding straining your urine to catch the stone.  Empty your bladder often. Avoid holding urine for long periods of time.  After a bowel movement, women should cleanse front to back. Use each tissue only once.  Empty your bladder before and after  sexual intercourse if you are a male. Contact a health care provider if:  You develop back pain.  You have a fever.  You have a feeling of sickness in your stomach (nausea) or vomiting.  Your symptoms are not better in 3 days. Return sooner if you are getting worse. Get help right away if:  You develop severe vomiting and are unable to keep the medicine down.  You develop severe back or abdominal pain despite taking your medicines.  You begin passing a large amount of blood or clots in your urine.  You feel extremely weak or faint, or you pass out. This information is not intended to replace advice given to you by your health care provider. Make sure you discuss any questions you have with your health care provider. Document Released: 07/19/2005 Document Revised: 12/25/2015 Document Reviewed: 03/19/2013 Elsevier Interactive Patient Education  2017 [place] you healthy  Get these tests  Blood pressure- Have your blood pressure checked once a year by your healthcare provider.  Normal blood pressure is 120/80  Weight- Have your body mass index (BMI) calculated to screen for obesity.  BMI is a measure of body fat based on height and weight. You can also calculate your own BMI at ViewBanking.si.  Cholesterol- Have your cholesterol checked every year.  Diabetes- Have your blood sugar checked regularly if you have high blood pressure, high cholesterol, have a family history of diabetes or if you are overweight.  Screening for Colon Cancer- Colonoscopy starting at age 38.  Screening may begin sooner depending on your family history and other health conditions. Follow up colonoscopy as directed by your Gastroenterologist.  Screening for Prostate Cancer- Both blood work (PSA) and a rectal exam help screen for Prostate Cancer.  Screening begins at age 28 with African-American men and at age 46 with Caucasian men.  Screening may begin sooner depending on your  family history.  Take these medicines  Aspirin- One aspirin daily can help prevent Heart disease and Stroke.  Flu shot- Every fall.  Tetanus- Every 10 years.  Zostavax- Once after the age of 79 to prevent Shingles.  Pneumonia shot- Once after the age of 32; if you are younger than 11, ask your healthcare provider if you need a Pneumonia shot.  Take these steps  Don't smoke- If you do smoke, talk to your doctor about quitting.  For tips on how to quit, go to www.smokefree.gov or call 1-800-QUIT-NOW.  Be physically active- Exercise 5 days a week for at least 30 minutes.  If you are not already physically active start slow and gradually work up to 30 minutes of moderate physical activity.  Examples of moderate activity include walking briskly, mowing the yard, dancing, swimming, bicycling, etc.  Eat a healthy diet- Eat a variety of healthy food such as fruits, vegetables, low fat milk, low fat cheese, yogurt, lean meant, poultry, fish, beans, tofu, etc. For more information go to www.thenutritionsource.org  Drink alcohol in moderation- Limit alcohol intake to less than two drinks a day. Never drink and drive.  Dentist- Brush and floss twice daily; visit your dentist twice a year.  Depression- Your emotional health is as important as your physical health. If you're feeling down, or losing interest in things you would normally enjoy please talk to your healthcare provider.  Eye exam- Visit your eye doctor every year.  Safe sex- If you may be exposed to a sexually transmitted infection, use a condom.  Seat belts- Seat belts can save your life; always wear one.  Smoke/Carbon Monoxide detectors- These detectors need to be installed on the appropriate level of your home.  Replace batteries at least once a year.  Skin cancer- When out in the sun, cover up and use sunscreen 15 SPF or higher.  Violence- If anyone is threatening you, please tell your healthcare provider.  Living Will/  Health care power of attorney- Speak with your healthcare provider and family.  IF you received an x-ray today, you will receive an invoice from [facility]. Please contact [facility] at [phone] with questions or concerns regarding your invoice.   IF you received labwork today, you will receive an invoice from [facility]. Please contact [facility] at [phone] with questions or concerns regarding your invoice.   Our billing staff will not be able to assist you with questions regarding bills from these companies.  You will be contacted with the lab results as soon as they are available. The fastest way to get your results is to activate your My Chart account. Instructions are located on the last page of this paperwork. If you have not heard from [place] regarding the results in 2 weeks, please contact this office.

## 2016-12-16 NOTE — Progress Notes (Signed)
By signing my name below, I, Mesha Guinyard, attest that this documentation has been prepared under the direction and in the presence of Merri Ray, MD.  Electronically Signed: Verlee Monte, Medical Scribe. 12/16/16. 9:21 AM.  Subjective:    Patient ID: Daniel Palms., male    DOB: 03/14/1956, 62 y.o.   MRN: [id number]  HPI Chief Complaint  Patient presents with  . Annual Exam    HPI Comments: Daniel Samples. is a 61 y.o. male with a PMHx of HTN, HLD, and gout who presents to Primary Care at [facility] for hi annual physical.   HTN: He takes lisinopril 20 mg QD. Pt is complaint with lisinopril but doesn't check his bp. Denies experiencing negative side effects or acute sxs. Lab Results  Component Value Date   CREATININE 0.96 07/08/2016   HLD: Takes lipitor 20 mg QD, and ASA 81 mg QD. Pt is complaint with lipitor. Denies myalgias or other side effects or acute sxs. Lab Results  Component Value Date   CHOL 174 07/08/2016   HDL 50 07/08/2016   LDLCALC 84 07/08/2016   TRIG 202 (H) 07/08/2016   CHOLHDL 3.5 07/08/2016   Lab Results  Component Value Date   ALT 13 07/08/2016   AST 25 07/08/2016   ALKPHOS 58 07/08/2016   BILITOT 0.3 07/08/2016   Gout: He's on allopurinol 100 mg QD with indocin PRN with flare. Uric acid 5.5 in Dec 2017. Pt is complaint with allopurinol. Denies recent gout flares. Lab Results  Component Value Date   LABURIC 5.5 07/08/2016   Cancer Screening: Prostate: Minimal change from 2.37 in May 2016. Small blood in May 2017, but he's also had that in the past. Nocturia 1-2 episodes per night; no recent changes. Pt has not gone to a urologist for hematuria. Denies visible hematuria and FHx of prostate CA. Lab Results  Component Value Date   PSA 2.60 12/24/2015   PSA 2.37 12/16/2014   PSA 1.96 11/26/2013  Colon: Colonoscopy every 5 years due to FHx last in 2015. FHx of colon CA in mother  Immunizations: Immunization History  Administered Date(s)  Administered  . Influenza Nasal 05/16/2014, 05/18/2016  . Influenza,inj,Quad PF,36+ Mos 06/16/2015  . Tdap 12/27/2007   Vision: Change from 20/15 with testing last year. Pt's last ophthalmologist appt was 8-9 years ago.  Visual Acuity Screening   Right eye Left eye Both eyes  Without correction: 20/25 20/25 20/25   With correction:      Dentist: Pt is followed by a dentist with his last cleaning 2 weeks ago.  Exercise: Pt walks 2-3x a week and on exercises on Saturdays.  Depression Screening: Depression screen [facility] 2/9 12/16/2016 07/08/2016 12/24/2015 06/16/2015 12/16/2014  Decreased Interest 0 0 0 0 0  Down, Depressed, Hopeless 0 0 0 0 0  PHQ - 2 Score 0 0 0 0 0   Patient Active Problem List   Diagnosis Date Noted  . Nonspecific abnormal finding in stool contents 02/28/2015  . Family history of cancer 02/28/2015  . OA (osteoarthritis) 02/28/2012  . HTN (hypertension) 11/27/2011  . Lipid disorder 11/27/2011  . Gout attack 11/27/2011   Past Medical History:  Diagnosis Date  . Arthritis   . Colon polyps   . Gout attack   . Hyperlipidemia   . Hypertension    Past Surgical History:  Procedure Laterality Date  . FRACTURE SURGERY  2000   LEFT ankle, ORIF   No Known Allergies Prior to Admission medications   Medication  Sig Start Date End Date Taking? Authorizing Provider  allopurinol (ZYLOPRIM) 100 MG tablet Take 1 tablet (100 mg total) by mouth daily. 07/08/16   [name], MD  aspirin 81 MG tablet Take 81 mg by mouth daily.    [provider]  atorvastatin (LIPITOR) 20 MG tablet take 1 tablet by mouth at bedtime 07/08/16   [name], MD  ferrous sulfate 325 (65 FE) MG tablet Take 325 mg by mouth daily with breakfast.    [provider]  indomethacin (INDOCIN) 50 MG capsule Take 1 capsule (50 mg total) by mouth 3 (three) times daily as needed. for MILD PAIN(GOUT) 12/24/15   [name], MD  lisinopril (PRINIVIL,ZESTRIL) 20 MG tablet Take 1  tablet (20 mg total) by mouth daily. 07/08/16   [name], MD   Social History   Social History  . Marital status: Married    Spouse name: [name]  . Number of children: 1  . Years of education: 14+   Occupational History  . SHIFT SUPERVISOR [employer]   Social History Main Topics  . Smoking status: Never Smoker  . Smokeless tobacco: Never Used  . Alcohol use 1.8 oz/week    3 Standard drinks or equivalent per week     Comment: 3-5 drinks  . Drug use: No  . Sexual activity: Yes    Partners: Female   Other Topics Concern  . Not on file   Social History Narrative   Lives with his wife and their daughter.   Exercise: Walking   Education: some College   Review of Systems  13 point ROS was negative.  Objective:  Physical Exam  Constitutional: He is oriented to person, place, and time. He appears well-developed and well-nourished.  HENT:  Head: Normocephalic and atraumatic.  Right Ear: External ear normal.  Left Ear: External ear normal.  Mouth/Throat: Oropharynx is clear and moist.  Eyes: Conjunctivae and EOM are normal. Pupils are equal, round, and reactive to light.  Neck: Normal range of motion. Neck supple. No thyromegaly present.  Cardiovascular: Normal rate, regular rhythm, normal heart sounds and intact distal pulses.   Pulmonary/Chest: Effort normal and breath sounds normal. No respiratory distress. He has no wheezes.  Abdominal: Soft. He exhibits no distension. There is no tenderness. Hernia confirmed negative in the right inguinal area and confirmed negative in the left inguinal area.  Genitourinary: Prostate normal.  Genitourinary Comments: Prostate slightly firm bilaterally, without focal nodule  Musculoskeletal: Normal range of motion. He exhibits no edema or tenderness.  Lymphadenopathy:    He has no cervical adenopathy.  Neurological: He is alert and oriented to person, place, and time. He has normal reflexes.  Skin: Skin is warm and dry.    Psychiatric: He has a normal mood and affect. His behavior is normal.  Vitals reviewed.   Vitals:   12/16/16 0846  BP: 137/87  Pulse: 85  Resp: 18  Temp: 98 F (36.7 C)  TempSrc: Oral  SpO2: 98%  Weight: 210 lb (95.3 kg)  Height: 5' 9.09" (1.755 m)   Body mass index is 30.93 kg/m.   Results for orders placed or performed in visit on 12/16/16  POCT urinalysis dipstick  Result Value Ref Range   Color, UA straw (A) yellow   Clarity, UA clear clear   Glucose, UA negative negative mg/dL   Bilirubin, UA negative negative   Ketones, POC UA negative negative mg/dL   Spec Grav, UA 1.020 1.010 -  1.025   Blood, UA moderate (A) negative   pH, UA 5.5 5.0 - 8.0   Protein Ur, POC negative negative mg/dL   Urobilinogen, UA 0.2 0.2 or 1.0 E.U./dL   Nitrite, UA Negative Negative   Leukocytes, UA Negative Negative   Assessment & Plan:    Daniel Baba. is a 61 y.o. male Annual physical exam -anticipatory guidance as below in AVS, screening labs above. Health maintenance items as above in HPI discussed/recommended as applicable.   Hematuria, microscopic - Plan: PSA, POCT urinalysis dipstick, Urine Microscopic  - firm prostate on exam, with hematuria. Will plan on referral to urology after review of PSA.   Essential hypertension - Plan: Comprehensive metabolic panel, lisinopril (PRINIVIL,ZESTRIL) 20 MG tablet  -stable, no med changes. Labs pending. Continue same dose of lisinopril.  Gout of multiple sites, unspecified cause, unspecified chronicity - Plan: allopurinol (ZYLOPRIM) 100 MG tablet  - Stable on allopurinol with episodic indomethacin use.  Hyperlipidemia, unspecified hyperlipidemia type - Plan: Comprehensive metabolic panel, Lipid panel, atorvastatin (LIPITOR) 20 MG tablet  -Tolerating Lipitor, continue same dose, labs pending.  Meds ordered this encounter  Medications  . allopurinol (ZYLOPRIM) 100 MG tablet    Sig: Take 1 tablet (100 mg total) by mouth daily.     Dispense:  90 tablet    Refill:  1  . atorvastatin (LIPITOR) 20 MG tablet    Sig: take 1 tablet by mouth at bedtime    Dispense:  90 tablet    Refill:  1  . lisinopril (PRINIVIL,ZESTRIL) 20 MG tablet    Sig: Take 1 tablet (20 mg total) by mouth daily.    Dispense:  90 tablet    Refill:  1   Patient Instructions   Follow up with eye care provider as discussed.   If blood still in urine, it may be helpful to meet with urologist and I will refer you. I did not feel a nodule on prostate exam, but it did feel firm on both sides. That can also be rechecked with urologist.   No change in meds for now. Recheck in 6 months.     Hematuria, Adult Hematuria is blood in your urine. It can be caused by a bladder infection, kidney infection, prostate infection, kidney stone, or cancer of your urinary tract. Infections can usually be treated with medicine, and a kidney stone usually will pass through your urine. If neither of these is the cause of your hematuria, further workup to find out the reason may be needed. It is very important that you tell your health care provider about any blood you see in your urine, even if the blood stops without treatment or happens without causing pain. Blood in your urine that happens and then stops and then happens again can be a symptom of a very serious condition. Also, pain is not a symptom in the initial stages of many urinary cancers. Follow these instructions at home:  Drink lots of fluid, 3-4 quarts a day. If you have been diagnosed with an infection, cranberry juice is especially recommended, in addition to large amounts of water.  Avoid caffeine, tea, and carbonated beverages because they tend to irritate the bladder.  Avoid alcohol because it may irritate the prostate.  Take all medicines as directed by your health care provider.  If you were prescribed an antibiotic medicine, finish it all even if you start to feel better.  If you have been diagnosed  with a kidney stone, follow your health  care provider's instructions regarding straining your urine to catch the stone.  Empty your bladder often. Avoid holding urine for long periods of time.  After a bowel movement, women should cleanse front to back. Use each tissue only once.  Empty your bladder before and after sexual intercourse if you are a male. Contact a health care provider if:  You develop back pain.  You have a fever.  You have a feeling of sickness in your stomach (nausea) or vomiting.  Your symptoms are not better in 3 days. Return sooner if you are getting worse. Get help right away if:  You develop severe vomiting and are unable to keep the medicine down.  You develop severe back or abdominal pain despite taking your medicines.  You begin passing a large amount of blood or clots in your urine.  You feel extremely weak or faint, or you pass out. This information is not intended to replace advice given to you by your health care provider. Make sure you discuss any questions you have with your health care provider. Document Released: 07/19/2005 Document Revised: 12/25/2015 Document Reviewed: 03/19/2013 Elsevier Interactive Patient Education  2017 [place] healthy  Get these tests  Blood pressure- Have your blood pressure checked once a year by your healthcare provider.  Normal blood pressure is 120/80  Weight- Have your body mass index (BMI) calculated to screen for obesity.  BMI is a measure of body fat based on height and weight. You can also calculate your own BMI at ViewBanking.si.  Cholesterol- Have your cholesterol checked every year.  Diabetes- Have your blood sugar checked regularly if you have high blood pressure, high cholesterol, have a family history of diabetes or if you are overweight.  Screening for Colon Cancer- Colonoscopy starting at age 19.  Screening may begin sooner depending on your family history and other  health conditions. Follow up colonoscopy as directed by your Gastroenterologist.  Screening for Prostate Cancer- Both blood work (PSA) and a rectal exam help screen for Prostate Cancer.  Screening begins at age 27 with African-American men and at age 16 with Caucasian men.  Screening may begin sooner depending on your family history.  Take these medicines  Aspirin- One aspirin daily can help prevent Heart disease and Stroke.  Flu shot- Every fall.  Tetanus- Every 10 years.  Zostavax- Once after the age of 75 to prevent Shingles.  Pneumonia shot- Once after the age of 38; if you are younger than 72, ask your healthcare provider if you need a Pneumonia shot.  Take these steps  Don't smoke- If you do smoke, talk to your doctor about quitting.  For tips on how to quit, go to www.smokefree.gov or call 1-800-QUIT-NOW.  Be physically active- Exercise 5 days a week for at least 30 minutes.  If you are not already physically active start slow and gradually work up to 30 minutes of moderate physical activity.  Examples of moderate activity include walking briskly, mowing the yard, dancing, swimming, bicycling, etc.  Eat a healthy diet- Eat a variety of healthy food such as fruits, vegetables, low fat milk, low fat cheese, yogurt, lean meant, poultry, fish, beans, tofu, etc. For more information go to www.thenutritionsource.org  Drink alcohol in moderation- Limit alcohol intake to less than two drinks a day. Never drink and drive.  Dentist- Brush and floss twice daily; visit your dentist twice a year.  Depression- Your emotional health is as important as your physical health. If you're  feeling down, or losing interest in things you would normally enjoy please talk to your healthcare provider.  Eye exam- Visit your eye doctor every year.  Safe sex- If you may be exposed to a sexually transmitted infection, use a condom.  Seat belts- Seat belts can save your life; always wear  one.  Smoke/Carbon Monoxide detectors- These detectors need to be installed on the appropriate level of your home.  Replace batteries at least once a year.  Skin cancer- When out in the sun, cover up and use sunscreen 15 SPF or higher.  Violence- If anyone is threatening you, please tell your healthcare provider.  Living Will/ Health care power of attorney- Speak with your healthcare provider and family.  IF you received an x-ray today, you will receive an invoice from [facility]. Please contact [facility] at [phone] with questions or concerns regarding your invoice.   IF you received labwork today, you will receive an invoice from [place]. Please contact [facility] at [phone] with questions or concerns regarding your invoice.   Our billing staff will not be able to assist you with questions regarding bills from these companies.  You will be contacted with the lab results as soon as they are available. The fastest way to get your results is to activate your My Chart account. Instructions are located on the last page of this paperwork. If you have not heard from [place] regarding the results in 2 weeks, please contact this office.       I personally performed the services described in this documentation, which was scribed in my presence. The recorded information has been reviewed and considered for accuracy and completeness, addended by me as needed, and agree with information above.  Signed,   Merri Ray, MD [facility] [place].  12/17/16 10:11 PM

## 2016-12-17 LAB — URINALYSIS, MICROSCOPIC ONLY
Bacteria, UA: NONE SEEN
Casts: NONE SEEN /lpf
EPITHELIAL CELLS (NON RENAL): NONE SEEN /HPF (ref 0–10)

## 2016-12-17 LAB — COMPREHENSIVE METABOLIC PANEL
ALT: 30 IU/L (ref 0–44)
AST: 38 IU/L (ref 0–40)
Albumin/Globulin Ratio: 1.7 (ref 1.2–2.2)
Albumin: 4.5 g/dL (ref 3.5–5.5)
Alkaline Phosphatase: 58 IU/L (ref 39–117)
BUN/Creatinine Ratio: 14 (ref 9–20)
BUN: 12 mg/dL (ref 6–24)
Bilirubin Total: 0.4 mg/dL (ref 0.0–1.2)
CALCIUM: 9.8 mg/dL (ref 8.7–10.2)
CO2: 22 mmol/L (ref 18–29)
CREATININE: 0.84 mg/dL (ref 0.76–1.27)
Chloride: 96 mmol/L (ref 96–106)
GFR calc Af Amer: 111 mL/min/{1.73_m2} (ref >59)
GFR, EST NON AFRICAN AMERICAN: 96 mL/min/{1.73_m2} (ref >59)
GLOBULIN, TOTAL: 2.6 g/dL (ref 1.5–4.5)
GLUCOSE: 94 mg/dL (ref 65–99)
Potassium: 4.7 mmol/L (ref 3.5–5.2)
SODIUM: 137 mmol/L (ref 134–144)
Total Protein: 7.1 g/dL (ref 6.0–8.5)

## 2016-12-17 LAB — LIPID PANEL
CHOL/HDL RATIO: 3.2 ratio (ref 0.0–5.0)
Cholesterol, Total: 141 mg/dL (ref 100–199)
HDL: 44 mg/dL (ref >39)
Triglycerides: 476 mg/dL — ABNORMAL HIGH (ref 0–149)

## 2016-12-17 LAB — PSA: PROSTATE SPECIFIC AG, SERUM: 4 ng/mL (ref 0.0–4.0)

## 2017-01-04 ENCOUNTER — Telehealth: Payer: Self-pay | Admitting: Family Medicine

## 2017-01-04 NOTE — Telephone Encounter (Signed)
Dr Carlota Raspberry called pt about labs pt states that he can call him on his cell [phone] he says that he can call anytime he is at work and sometimes the phone don't pick up in certain areas at his place of employment

## 2017-05-16 ENCOUNTER — Ambulatory Visit (INDEPENDENT_AMBULATORY_CARE_PROVIDER_SITE_OTHER): Payer: 59 | Admitting: Physician Assistant

## 2017-05-16 ENCOUNTER — Encounter: Payer: Self-pay | Admitting: Physician Assistant

## 2017-05-16 VITALS — BP 116/80 | HR 79 | Temp 99.0°F | Resp 16 | Ht 70.0 in | Wt 219.8 lb

## 2017-05-16 DIAGNOSIS — T148XXA Other injury of unspecified body region, initial encounter: Secondary | ICD-10-CM

## 2017-05-16 NOTE — Patient Instructions (Addendum)
Pay attention to whether you are experiencing local trauma to your chest caused by physical activity. Watch for any bruising involving other areas of your body.  If we need to do more blood work, I will call you and have you come in for a LAB ONLY visit.  We will contact you with the results of your blood work.   Thank you for coming in today. I hope you feel we met your needs.  Feel free to call PCP if you have any questions or further requests.  Please consider signing up for MyChart if you do not already have it, as this is a great way to communicate with me.  Best,  Whitney McVey, PA-C   IF you received an x-ray today, you will receive an invoice from [facility]. Please contact [facility] at [phone] with questions or concerns regarding your invoice.   IF you received labwork today, you will receive an invoice from [facility]. Please contact [facility] at [phone] with questions or concerns regarding your invoice.   Our billing staff will not be able to assist you with questions regarding bills from these companies.  You will be contacted with the lab results as soon as they are available. The fastest way to get your results is to activate your My Chart account. Instructions are located on the last page of this paperwork. If you have not heard from [place] regarding the results in 2 weeks, please contact this office.

## 2017-05-16 NOTE — Progress Notes (Signed)
   Daniel Davis.  MRN: [id number] DOB: 1955-11-09  PCP: [name], MD  Subjective:  Pt is a 61 year old male PMH HTN, HLD, and gout who presents to clinic for skin problem. Endorses a bruise on his chest. Noticed it this morning. Had something similar to this about one month ago - also in the same area of his chest - also noticed this in the morning. No other areas of bruising. When asked questions about lifestyle/habits/routines, he wonders if he is sleeping on his belly with his fist on his chest.  Denies bleeding of his gums, nose bleeds, chest pain, calf pain, cough.   Review of Systems  HENT: Negative for dental problem and nosebleeds.   Respiratory: Negative for cough, chest tightness and shortness of breath.   Cardiovascular: Negative for chest pain and palpitations.  Gastrointestinal: Negative for anal bleeding and blood in stool.  Genitourinary: Positive for hematuria.  Musculoskeletal: Negative for arthralgias, back pain, joint swelling and myalgias.  Skin: Positive for color change.  Neurological: Negative for dizziness, light-headedness and headaches.    Patient Active Problem List   Diagnosis Date Noted  . Nonspecific abnormal finding in stool contents 02/28/2015  . Family history of cancer 02/28/2015  . OA (osteoarthritis) 02/28/2012  . HTN (hypertension) 11/27/2011  . Lipid disorder 11/27/2011  . Gout attack 11/27/2011    Current Outpatient Prescriptions on File Prior to Visit  Medication Sig Dispense Refill  . allopurinol (ZYLOPRIM) 100 MG tablet Take 1 tablet (100 mg total) by mouth daily. 90 tablet 1  . aspirin 81 MG tablet Take 81 mg by mouth daily.    Marland Kitchen atorvastatin (LIPITOR) 20 MG tablet take 1 tablet by mouth at bedtime 90 tablet 1  . ferrous sulfate 325 (65 FE) MG tablet Take 325 mg by mouth daily with breakfast.    . indomethacin (INDOCIN) 50 MG capsule Take 1 capsule (50 mg total) by mouth 3 (three) times daily as needed. for MILD PAIN(GOUT) 60  capsule 0  . lisinopril (PRINIVIL,ZESTRIL) 20 MG tablet Take 1 tablet (20 mg total) by mouth daily. 90 tablet 1   No current facility-administered medications on file prior to visit.     No Known Allergies   Objective:  BP 116/80   Pulse 79   Temp 99 F (37.2 C) (Oral)   Resp 16   Ht 5\' 10"  (1.778 m)   Wt 219 lb 12.8 oz (99.7 kg)   SpO2 95%   BMI 31.54 kg/m   Physical Exam  Constitutional: He is oriented to person, place, and time and well-developed, well-nourished, and in no distress. No distress.  Cardiovascular: Normal rate, regular rhythm and normal heart sounds.   Neurological: He is alert and oriented to person, place, and time. GCS score is 15.  Skin: Skin is warm and dry.     Psychiatric: Mood, memory, affect and judgment normal.  Vitals reviewed.      Assessment and Plan :  1. Hematoma - CBC with Differential/Platelet - Protime-INR - Localized bruise of mid chest. TTP. Suspect hematoma, possibly from sleeping position. Advised pt to be mindful of activities and sleeping position. Labs are pending, will contact with results.   Mercer Pod, PA-C  [facility] 05/16/2017 8:11 AM

## 2017-05-17 LAB — CBC WITH DIFFERENTIAL/PLATELET
Basophils Absolute: 0 10*3/uL (ref 0.0–0.2)
Basos: 0 %
EOS (ABSOLUTE): 0 10*3/uL (ref 0.0–0.4)
Eos: 1 %
Hematocrit: 39.1 % (ref 37.5–51.0)
Hemoglobin: 12.7 g/dL — ABNORMAL LOW (ref 13.0–17.7)
Immature Grans (Abs): 0 10*3/uL (ref 0.0–0.1)
Immature Granulocytes: 0 %
Lymphocytes Absolute: 2 10*3/uL (ref 0.7–3.1)
Lymphs: 33 %
MCH: 28 pg (ref 26.6–33.0)
MCHC: 32.5 g/dL (ref 31.5–35.7)
MCV: 86 fL (ref 79–97)
Monocytes Absolute: 0.6 10*3/uL (ref 0.1–0.9)
Monocytes: 10 %
Neutrophils Absolute: 3.5 10*3/uL (ref 1.4–7.0)
Neutrophils: 56 %
Platelets: 231 10*3/uL (ref 150–379)
RBC: 4.53 x10E6/uL (ref 4.14–5.80)
RDW: 14.2 % (ref 12.3–15.4)
WBC: 6.1 10*3/uL (ref 3.4–10.8)

## 2017-05-17 LAB — PROTIME-INR
INR: 0.9 (ref 0.8–1.2)
Prothrombin Time: 9.8 s (ref 9.1–12.0)

## 2017-06-20 ENCOUNTER — Ambulatory Visit (INDEPENDENT_AMBULATORY_CARE_PROVIDER_SITE_OTHER): Payer: 59 | Admitting: Family Medicine

## 2017-06-20 ENCOUNTER — Other Ambulatory Visit: Payer: Self-pay

## 2017-06-20 ENCOUNTER — Encounter: Payer: Self-pay | Admitting: Family Medicine

## 2017-06-20 VITALS — BP 136/68 | HR 99 | Temp 98.5°F | Resp 16 | Ht 71.0 in | Wt 212.6 lb

## 2017-06-20 DIAGNOSIS — E785 Hyperlipidemia, unspecified: Secondary | ICD-10-CM

## 2017-06-20 DIAGNOSIS — H9312 Tinnitus, left ear: Secondary | ICD-10-CM | POA: Diagnosis not present

## 2017-06-20 DIAGNOSIS — M109 Gout, unspecified: Secondary | ICD-10-CM | POA: Diagnosis not present

## 2017-06-20 DIAGNOSIS — I10 Essential (primary) hypertension: Secondary | ICD-10-CM | POA: Diagnosis not present

## 2017-06-20 MED ORDER — LISINOPRIL 20 MG PO TABS: 20.0000 mg | ORAL_TABLET | Freq: Every day | ORAL | 2 refills | Status: DC

## 2017-06-20 MED ORDER — INDOMETHACIN 50 MG PO CAPS: 50.0000 mg | ORAL_CAPSULE | Freq: Three times a day (TID) | ORAL | 0 refills | Status: DC | PRN

## 2017-06-20 MED ORDER — ATORVASTATIN CALCIUM 20 MG PO TABS: ORAL_TABLET | ORAL | 2 refills | Status: DC

## 2017-06-20 MED ORDER — ALLOPURINOL 100 MG PO TABS: 100.0000 mg | ORAL_TABLET | Freq: Every day | ORAL | 2 refills | Status: DC

## 2017-06-20 NOTE — Progress Notes (Signed)
Subjective:  By signing my name below, I, Daniel Davis, attest that this documentation has been prepared under the direction and in the presence of Merri Ray, MD. Electronically Signed: Moises Davis, [place]. 06/20/2017 , 2:40 PM .  Patient was seen in Room 12 .   Patient ID: Daniel Streett., male    DOB: June 19, 1956, 61 y.o.   MRN: [id number] Chief Complaint  Patient presents with  . Medication Refill    all medications   HPI Daniel Jayland Null. is a 61 y.o. male Here for medication refill.   Tinnitus - left ear Patient reports he's experienced intermittent ringing in his left ear for about 10 years now; would occur about once a month. He had a hearing test done at the [place] 10 years ago, and he informed them that he's always been around loud sounds with firearms in the [employer], and hunting since he was a child. He still shoots firearms for hunting and wears ear protection. He notes ringing hasn't changed. He denies any hearing difficulties. He does drive with his windows down.   He left [employer] service about 13 years ago.   HTN He takes lisinopril 20mg  QD. He doesn't check his Davis pressure at home often. No new side effects with medication.   Hyperlipidemia Lab Results  Component Value Date   CHOL 141 12/16/2016   HDL 44 12/16/2016   LDLCALC Comment 12/16/2016   TRIG 476 (H) 12/16/2016   CHOLHDL 3.2 12/16/2016   Lab Results  Component Value Date   ALT 30 12/16/2016   AST 38 12/16/2016   ALKPHOS 58 12/16/2016   BILITOT 0.4 12/16/2016   He takes Lipitor 20mg  QD. He denies any new side effects with his medication. He asked red yeast rice as an alternative.   The 10-year ASCVD risk score Daniel Bussing DC Brooke Bonito., et al., 2013) is: 13.5%   Values used to calculate the score:     Age: 59 years     Sex: Male     Is Non-Hispanic African American: Yes     Diabetic: No     Tobacco smoker: No     Systolic Davis Pressure: 354 mmHg     Is BP treated: Yes     HDL Cholesterol: 44  mg/dL     Total Cholesterol: 141 mg/dL   Gout He takes allopurinol 100mg  QD and indomethacin if needed for flares. He doesn't recall his last gout flare.   Patient Active Problem List   Diagnosis Date Noted  . Nonspecific abnormal finding in stool contents 02/28/2015  . Family history of cancer 02/28/2015  . OA (osteoarthritis) 02/28/2012  . HTN (hypertension) 11/27/2011  . Lipid disorder 11/27/2011  . Gout attack 11/27/2011   Past Medical History:  Diagnosis Date  . Arthritis   . Colon polyps   . Gout attack   . Hyperlipidemia   . Hypertension    Past Surgical History:  Procedure Laterality Date  . FRACTURE SURGERY  2000   LEFT ankle, ORIF   No Known Allergies Prior to Admission medications   Medication Sig Start Date End Date Taking? Authorizing Provider  allopurinol (ZYLOPRIM) 100 MG tablet Take 1 tablet (100 mg total) by mouth daily. 12/16/16  Yes [name], MD  aspirin 81 MG tablet Take 81 mg by mouth daily.   Yes [provider]  atorvastatin (LIPITOR) 20 MG tablet take 1 tablet by mouth at bedtime 12/16/16  Yes [name], MD  ferrous sulfate 325 (65 FE)  MG tablet Take 325 mg by mouth daily with breakfast.   Yes [provider]  indomethacin (INDOCIN) 50 MG capsule Take 1 capsule (50 mg total) by mouth 3 (three) times daily as needed. for MILD PAIN(GOUT) 12/24/15  Yes [name], MD  lisinopril (PRINIVIL,ZESTRIL) 20 MG tablet Take 1 tablet (20 mg total) by mouth daily. 12/16/16  Yes [name], MD   Social History   Socioeconomic History  . Marital status: Married    Spouse name: [name]  . Number of children: 1  . Years of education: 14+  . Highest education level: Not on file  Social Needs  . Financial resource strain: Not on file  . Food insecurity - worry: Not on file  . Food insecurity - inability: Not on file  . Transportation needs - medical: Not on file  . Transportation needs - non-medical: Not on file    Occupational History  . Occupation: SHIFT SUPERVISOR    Employer: [employer]  Tobacco Use  . Smoking status: Never Smoker  . Smokeless tobacco: Never Used  Substance and Sexual Activity  . Alcohol use: Yes    Alcohol/week: 1.8 oz    Types: 3 Standard drinks or equivalent per week    Comment: 3-5 drinks  . Drug use: No  . Sexual activity: Yes    Partners: Female  Other Topics Concern  . Not on file  Social History Narrative   Lives with his wife and their daughter.   Exercise: Walking   Education: some College   Review of Systems  Constitutional: Negative for fatigue and unexpected weight change.  HENT: Positive for tinnitus. Negative for ear discharge, ear pain and hearing loss.   Eyes: Negative for visual disturbance.  Respiratory: Negative for cough, chest tightness and shortness of breath.   Cardiovascular: Negative for chest pain, palpitations and leg swelling.  Gastrointestinal: Negative for abdominal pain and Davis in stool.  Neurological: Negative for dizziness, light-headedness and headaches.       Objective:   Physical Exam  Constitutional: He is oriented to person, place, and time. He appears well-developed and well-nourished.  HENT:  Head: Normocephalic and atraumatic.  Right Ear: Tympanic membrane normal.  Left Ear: Tympanic membrane normal.  Eyes: EOM are normal. Pupils are equal, round, and reactive to light.  Neck: No JVD present. Carotid bruit is not present.  Cardiovascular: Normal rate, regular rhythm and normal heart sounds.  No murmur heard. Pulmonary/Chest: Effort normal and breath sounds normal. He has no rales.  Musculoskeletal: He exhibits no edema.  Neurological: He is alert and oriented to person, place, and time.  Skin: Skin is warm and dry.  Psychiatric: He has a normal mood and affect.  Vitals reviewed.   Vitals:   06/20/17 1349 06/20/17 1448  BP: (!) 146/84 136/68  Pulse: 99   Resp: 16   Temp: 98.5 F (36.9 C)   TempSrc:  Oral   SpO2: 96%   Weight: 212 lb 9.6 oz (96.4 kg)   Height: 5\' 11"  (1.803 m)       Assessment & Plan:   Daniel Silversmith. is a 61 y.o. male Tinnitus of left ear - Plan: Ambulatory referral to Audiology Intermittent symptoms, long-standing. Denies change in hearing, but will refer for audiogram. If persistent symptoms, could meet with ENT to discuss it further workup indicated. Hearing protection discussed. RTC precautions if worse  Essential hypertension - Plan: lisinopril (PRINIVIL,ZESTRIL) 20 MG tablet  -Borderline, but controlled on recheck.  Monitor home readings, continue same dose lisinopril for now. Labs pending  Gout of multiple sites, unspecified cause, unspecified chronicity - Plan: allopurinol (ZYLOPRIM) 100 MG tablet, Uric Acid, indomethacin (INDOCIN) 50 MG capsule  -Stable without recent flare. Check uric acid, continue same dose of allopurinol daily as tolerating, indomethacin prescribed if needed for flare. Rare/intermittent use discussed with potential cardiac risks and effect on Davis pressure.  Hyperlipidemia, unspecified hyperlipidemia type - Plan: atorvastatin (LIPITOR) 20 MG tablet, Comprehensive metabolic panel, Lipid panel  - Tolerating Lipitor, check labs. ASCVD risk as above. Depending on new levels could consider over-the-counter red yeast rice, but suspect he may need prescription statin for more potent treatment.  Meds ordered this encounter  Medications  . lisinopril (PRINIVIL,ZESTRIL) 20 MG tablet    Sig: Take 1 tablet (20 mg total) daily by mouth.    Dispense:  90 tablet    Refill:  2  . atorvastatin (LIPITOR) 20 MG tablet    Sig: take 1 tablet by mouth at bedtime    Dispense:  90 tablet    Refill:  2  . allopurinol (ZYLOPRIM) 100 MG tablet    Sig: Take 1 tablet (100 mg total) daily by mouth.    Dispense:  90 tablet    Refill:  2  . indomethacin (INDOCIN) 50 MG capsule    Sig: Take 1 capsule (50 mg total) 3 (three) times daily as needed by mouth. for  MILD PAIN(GOUT)    Dispense:  60 capsule    Refill:  0   Patient Instructions   I can refer you to audiology for hearing test for ringing in left ear, but if that is more frequent, would recommend evaluation with ENT. Make sure you wear hearing protection with loud noise.   No change in meds for now, but monitor pressure out of office. If remaining over 140/90, may need change in regimen.   Follow up in 6 months.    IF you received an x-ray today, you will receive an invoice from [facility]. Please contact [facility] at [phone] with questions or concerns regarding your invoice.   IF you received labwork today, you will receive an invoice from [facility]. Please contact [facility] at [phone] with questions or concerns regarding your invoice.   Our billing staff will not be able to assist you with questions regarding bills from these companies.  You will be contacted with the lab results as soon as they are available. The fastest way to get your results is to activate your My Chart account. Instructions are located on the last page of this paperwork. If you have not heard from [place] regarding the results in 2 weeks, please contact this office.      I personally performed the services described in this documentation, which was scribed in my presence. The recorded information has been reviewed and considered for accuracy and completeness, addended by me as needed, and agree with information above.  Signed,   Merri Ray, MD [facility] [place].  06/20/17 3:33 PM

## 2017-06-20 NOTE — Patient Instructions (Addendum)
I can refer you to audiology for hearing test for ringing in left ear, but if that is more frequent, would recommend evaluation with ENT. Make sure you wear hearing protection with loud noise.   No change in meds for now, but monitor pressure out of office. If remaining over 140/90, may need change in regimen.   Follow up in 6 months.    IF you received an x-ray today, you will receive an invoice from [facility]. Please contact [facility] at [phone] with questions or concerns regarding your invoice.   IF you received labwork today, you will receive an invoice from [facility]. Please contact [facility] at [phone] with questions or concerns regarding your invoice.   Our billing staff will not be able to assist you with questions regarding bills from these companies.  You will be contacted with the lab results as soon as they are available. The fastest way to get your results is to activate your My Chart account. Instructions are located on the last page of this paperwork. If you have not heard from [place] regarding the results in 2 weeks, please contact this office.

## 2017-06-21 LAB — COMPREHENSIVE METABOLIC PANEL
ALBUMIN: 4.5 g/dL (ref 3.5–5.5)
ALK PHOS: 55 IU/L (ref 39–117)
ALT: 20 IU/L (ref 0–44)
AST: 31 IU/L (ref 0–40)
Albumin/Globulin Ratio: 1.9 (ref 1.2–2.2)
BUN / CREAT RATIO: 13 (ref 9–20)
BUN: 10 mg/dL (ref 6–24)
Bilirubin Total: 0.2 mg/dL (ref 0.0–1.2)
CO2: 21 mmol/L (ref 20–29)
CREATININE: 0.78 mg/dL (ref 0.76–1.27)
Calcium: 9.7 mg/dL (ref 8.7–10.2)
Chloride: 103 mmol/L (ref 96–106)
GFR calc non Af Amer: 99 mL/min/{1.73_m2} (ref >59)
GFR, EST AFRICAN AMERICAN: 114 mL/min/{1.73_m2} (ref >59)
GLOBULIN, TOTAL: 2.4 g/dL (ref 1.5–4.5)
Glucose: 95 mg/dL (ref 65–99)
Potassium: 4.2 mmol/L (ref 3.5–5.2)
SODIUM: 141 mmol/L (ref 134–144)
TOTAL PROTEIN: 6.9 g/dL (ref 6.0–8.5)

## 2017-06-21 LAB — LIPID PANEL
CHOL/HDL RATIO: 2.6 ratio (ref 0.0–5.0)
CHOLESTEROL TOTAL: 143 mg/dL (ref 100–199)
HDL: 54 mg/dL (ref >39)
LDL CALC: 37 mg/dL (ref 0–99)
Triglycerides: 258 mg/dL — ABNORMAL HIGH (ref 0–149)
VLDL Cholesterol Cal: 52 mg/dL — ABNORMAL HIGH (ref 5–40)

## 2017-06-21 LAB — URIC ACID: URIC ACID: 4.6 mg/dL (ref 3.7–8.6)

## 2017-09-05 ENCOUNTER — Ambulatory Visit (INDEPENDENT_AMBULATORY_CARE_PROVIDER_SITE_OTHER): Payer: 59

## 2017-09-05 ENCOUNTER — Ambulatory Visit (INDEPENDENT_AMBULATORY_CARE_PROVIDER_SITE_OTHER): Payer: 59 | Admitting: Family Medicine

## 2017-09-05 ENCOUNTER — Encounter: Payer: Self-pay | Admitting: Family Medicine

## 2017-09-05 ENCOUNTER — Telehealth: Payer: Self-pay | Admitting: Family Medicine

## 2017-09-05 VITALS — BP 118/76 | HR 86 | Temp 98.0°F | Resp 16 | Ht 70.0 in | Wt 214.0 lb

## 2017-09-05 DIAGNOSIS — S92424A Nondisplaced fracture of distal phalanx of right great toe, initial encounter for closed fracture: Secondary | ICD-10-CM | POA: Diagnosis not present

## 2017-09-05 DIAGNOSIS — S90111A Contusion of right great toe without damage to nail, initial encounter: Secondary | ICD-10-CM | POA: Diagnosis not present

## 2017-09-05 DIAGNOSIS — M79674 Pain in right toe(s): Secondary | ICD-10-CM | POA: Diagnosis not present

## 2017-09-05 NOTE — Telephone Encounter (Signed)
[place] from [facility] calling to confirm stat xray results were available for Dr. Carlota Raspberry to view in [place]. Verification given that xray results were in the system.   Xray of great right toe: IMPRESSION: Small avulsion along the medial aspect of the proximal most aspect of the first distal phalanx with alignment near anatomic. No other fracture. No dislocation. Relatively mild osteoarthritic change in the first MTP and IP joints.  These results will be called to the ordering clinician or representative by the Radiologist Assistant, and communication documented in the PACS or zVision Dashboard.

## 2017-09-05 NOTE — Progress Notes (Signed)
Subjective:  By signing my name below, I, Daniel Davis, attest that this documentation has been prepared under the direction and in the presence of [name], MD Electronically Signed: Ladene Artist, ED Scribe 09/05/2017 at 5:14 PM.   Patient ID: Daniel Davis., male    DOB: 1955-10-10, 62 y.o.   MRN: [id number]  Chief Complaint  Patient presents with  . Toe Injury    right foot great toe injury - stubbed on floor no lacerations   HPI Daniel Davis. is a 62 y.o. male who presents to Primary Care at [facility] complaining of R great toe pain onset last night. Pt states that he stubbed his R great toe on the floor last night while getting out of the bed. He noticed a little swelling this morning but he was still able to bear weight. Pt went to work in [employer] shoes but noticed bruising to the R great toe around 11:30 AM after removing his shoe. He elevated his foot and iced the area for 1.5 hours this afternoon. No meds tried PTA. Denies lacerations, bleeding, ankle pain.  Patient Active Problem List   Diagnosis Date Noted  . Nonspecific abnormal finding in stool contents 02/28/2015  . Family history of cancer 02/28/2015  . OA (osteoarthritis) 02/28/2012  . HTN (hypertension) 11/27/2011  . Lipid disorder 11/27/2011  . Gout attack 11/27/2011   Past Medical History:  Diagnosis Date  . Arthritis   . Colon polyps   . Gout attack   . Hyperlipidemia   . Hypertension    Past Surgical History:  Procedure Laterality Date  . FRACTURE SURGERY  2000   LEFT ankle, ORIF   No Known Allergies Prior to Admission medications   Medication Sig Start Date End Date Taking? Authorizing Provider  allopurinol (ZYLOPRIM) 100 MG tablet Take 1 tablet (100 mg total) daily by mouth. 06/20/17   [name], MD  aspirin 81 MG tablet Take 81 mg by mouth daily.    [provider]  atorvastatin (LIPITOR) 20 MG tablet take 1 tablet by mouth at bedtime 06/20/17   [name], MD    ferrous sulfate 325 (65 FE) MG tablet Take 325 mg by mouth daily with breakfast.    [provider]  indomethacin (INDOCIN) 50 MG capsule Take 1 capsule (50 mg total) 3 (three) times daily as needed by mouth. for MILD PAIN(GOUT) 06/20/17   [name], MD  lisinopril (PRINIVIL,ZESTRIL) 20 MG tablet Take 1 tablet (20 mg total) daily by mouth. 06/20/17   [name], MD   Social History   Socioeconomic History  . Marital status: Married    Spouse name: [name]  . Number of children: 1  . Years of education: 14+  . Highest education level: Not on file  Social Needs  . Financial resource strain: Not on file  . Food insecurity - worry: Not on file  . Food insecurity - inability: Not on file  . Transportation needs - medical: Not on file  . Transportation needs - non-medical: Not on file  Occupational History  . Occupation: SHIFT SUPERVISOR    Employer: [employer]  Tobacco Use  . Smoking status: Never Smoker  . Smokeless tobacco: Never Used  Substance and Sexual Activity  . Alcohol use: Yes    Alcohol/week: 1.8 oz    Types: 3 Standard drinks or equivalent per week    Comment: 3-5 drinks  . Drug use: No  . Sexual activity:  Yes    Partners: Female  Other Topics Concern  . Not on file  Social History Narrative   Lives with his wife and their daughter.   Exercise: Walking   Education: some College   Review of Systems  Musculoskeletal: Positive for arthralgias and joint swelling.  Skin: Negative for wound.      Objective:   Physical Exam  Constitutional: He is oriented to person, place, and time. He appears well-developed and well-nourished. No distress.  HENT:  Head: Normocephalic and atraumatic.  Eyes: Conjunctivae and EOM are normal.  Neck: Neck supple. No tracheal deviation present.  Cardiovascular: Normal rate.  Pulmonary/Chest: Effort normal. No respiratory distress.  Musculoskeletal: Normal range of motion.  Soft tissue swelling at the R  great toe into the distal medial foot. Naviculas and the 5th metatarsal nontender. Remainder of foot nontender other than. MTP nontender. Proximal great toe phalanx tender. Tender on the dorsum of the IP. Nail is intact. Flexion and extension intact at the great toe. Cap refill less than 1 sec. Sensation intact distally.  Neurological: He is alert and oriented to person, place, and time.  Skin: Skin is warm and dry.  Psychiatric: He has a normal mood and affect. His behavior is normal.  Nursing note and vitals reviewed.  Vitals:   09/05/17 1653  BP: 118/76  Pulse: 86  Resp: 16  Temp: 98 F (36.7 C)  TempSrc: Oral  SpO2: 94%  Weight: 214 lb (97.1 kg)  Height: 5\' 10"  (1.778 m)   Dg Toe Great Right  Result Date: 09/05/2017 CLINICAL DATA:  Swelling and bruising following injury EXAM: RIGHT FIRST TOE: 3 V COMPARISON:  None. FINDINGS: Frontal, oblique, and lateral views were obtained. There is a small avulsion type injury along the medial aspect of the proximal most aspect of the first distal phalanx. Alignment near anatomic in this area. No other fracture. No dislocation. There is mild osteoarthritic change in the first MTP and IP joints. No erosive changes. IMPRESSION: Small avulsion along the medial aspect of the proximal most aspect of the first distal phalanx with alignment near anatomic. No other fracture. No dislocation. Relatively mild osteoarthritic change in the first MTP and IP joints. These results will be called to the ordering clinician or representative by the Radiologist Assistant, and communication documented in the PACS or zVision Dashboard. Electronically Signed   By: Lowella Grip III M.D.   On: 09/05/2017 17:21       Assessment & Plan:   Daniel Davis. is a 62 y.o. male Pain of right great toe - Plan: DG Toe Great Right  Nondisplaced fracture of distal phalanx of right great toe, initial encounter for closed fracture - Plan: Post-op shoe, Buddy tape toes  - Small  avulsion of the distal phalanx of great toe as above. Overall appears aligned.  -Buddy tape first to second toe, postop shoe, and at work okay to wear steel toed boots if fairly stiff forefoot to minimize toe flexion.  - Recheck 2 weeks. Symptomatic care discussed as below in the meantime.  No orders of the defined types were placed in this encounter.  Patient Instructions   Buddy tape toes as discussed, post op shoe OR stiff work boot, and recheck in 2 weeks.  Ice for up to 10 minutes and elevation when able next few days.  Tylenol over the counter if needed.  Return to the clinic or go to the nearest emergency room if any of your symptoms worsen or new  symptoms occur.   Toe Fracture A toe fracture is a break in one of the toe bones (phalanges). What are the causes? This condition may be caused by:  Dropping a heavy object on your toe.  Stubbing your toe.  Overusing your toe or doing repetitive exercise.  Twisting or stretching your toe out of place.  What increases the risk? This condition is more likely to develop in people who:  Play contact sports.  Have a bone disease.  Have a low calcium level.  What are the signs or symptoms? The main symptoms of this condition are swelling and pain in the toe. The pain may get worse with standing or walking. Other symptoms include:  Bruising.  Stiffness.  Numbness.  A change in the way the toe looks.  Broken bones that poke through the skin.  Blood beneath the toenail.  How is this diagnosed? This condition is diagnosed with a physical exam. You may also have X-rays. How is this treated? Treatment for this condition depends on the type of fracture and its severity. Treatment may involve:  Taping the broken toe to a toe that is next to it (buddy taping). This is the most common treatment for fractures in which the bone has not moved out of place (nondisplaced fracture).  Wearing a shoe that has a wide, rigid sole to  protect the toe and to limit its movement.  Wearing a walking cast.  Having a procedure to move the toe back into place.  Surgery. This may be needed: ? If there are many pieces of broken bone that are out of place (displaced). ? If the toe joint breaks. ? If the bone breaks through the skin.  Physical therapy. This is done to help regain movement and strength in the toe.  You may need follow-up X-rays to make sure that the bone is healing well and staying in position. Follow these instructions at home: If you have a cast:  Do not stick anything inside the cast to scratch your skin. Doing that increases your risk of infection.  Check the skin around the cast every day. Report any concerns to your health care provider. You may put lotion on dry skin around the edges of the cast. Do not apply lotion to the skin underneath the cast.  Do not put pressure on any part of the cast until it is fully hardened. This may take several hours.  Keep the cast clean and dry. Bathing  Do not take baths, swim, or use a hot tub until your health care provider approves. Ask your health care provider if you can take showers. You may only be allowed to take sponge baths for bathing.  If your health care provider approves bathing and showering, cover the cast or bandage (dressing) with a watertight plastic bag to protect it from water. Do not let the cast or dressing get wet. Managing pain, stiffness, and swelling  If you do not have a cast, apply ice to the injured area, if directed. ? Put ice in a plastic bag. ? Place a towel between your skin and the bag. ? Leave the ice on for 20 minutes, 2-3 times per day.  Move your toes often to avoid stiffness and to lessen swelling.  Raise (elevate) the injured area above the level of your heart while you are sitting or lying down. Driving  Do not drive or operate heavy machinery while taking pain medicine.  Do not drive while wearing a cast on  a foot  that you use for driving. Activity  Return to your normal activities as directed by your health care provider. Ask your health care provider what activities are safe for you.  Perform exercises daily as directed by your health care provider or physical therapist. Safety  Do not use the injured limb to support your body weight until your health care provider says that you can. Use crutches or other assistive devices as directed by your health care provider. General instructions  If your toe was treated with buddy taping, follow your health care provider's instructions for changing the gauze and tape. Change it more often: ? The gauze and tape get wet. If this happens, dry the space between the toes. ? The gauze and tape are too tight and cause your toe to become pale or numb.  Wear a protective shoe as directed by your health care provider. If you were not given a protective shoe, wear sturdy, supportive shoes. Your shoes should not pinch your toes and should not fit tightly against your toes.  Do not use any tobacco products, including cigarettes, chewing tobacco, or e-cigarettes. Tobacco can delay bone healing. If you need help quitting, ask your health care provider.  Take medicines only as directed by your health care provider.  Keep all follow-up visits as directed by your health care provider. This is important. Contact a health care provider if:  You have a fever.  Your pain medicine is not helping.  Your toe is cold.  Your toe is numb.  You still have pain after one week of rest and treatment.  You still have pain after your health care provider has said that you can start walking again.  You have pain, tingling, or numbness in your foot that is not going away. Get help right away if:  You have severe pain.  You have redness or inflammation in your toe that is getting worse.  You have pain or numbness in your toe that is getting worse.  Your toe turns blue. This  information is not intended to replace advice given to you by your health care provider. Make sure you discuss any questions you have with your health care provider. Document Released: 07/16/2000 Document Revised: 03/22/2016 Document Reviewed: 05/15/2014 Elsevier Interactive Patient Education  2018 [employer].    IF you received an x-ray today, you will receive an invoice from [facility]. Please contact [facility] at [phone] with questions or concerns regarding your invoice.   IF you received labwork today, you will receive an invoice from [place]. Please contact [facility] at [phone] with questions or concerns regarding your invoice.   Our billing staff will not be able to assist you with questions regarding bills from these companies.  You will be contacted with the lab results as soon as they are available. The fastest way to get your results is to activate your My Chart account. Instructions are located on the last page of this paperwork. If you have not heard from [place] regarding the results in 2 weeks, please contact this office.       I personally performed the services described in this documentation, which was scribed in my presence. The recorded information has been reviewed and considered for accuracy and completeness, addended by me as needed, and agree with information above.  Signed,   Merri Ray, MD [facility] at [facility].  09/07/17 12:39 PM

## 2017-09-05 NOTE — Patient Instructions (Addendum)
Buddy tape toes as discussed, post op shoe OR stiff work boot, and recheck in 2 weeks.  Ice for up to 10 minutes and elevation when able next few days.  Tylenol over the counter if needed.  Return to the clinic or go to the nearest emergency room if any of your symptoms worsen or new symptoms occur.   Toe Fracture A toe fracture is a break in one of the toe bones (phalanges). What are the causes? This condition may be caused by:  Dropping a heavy object on your toe.  Stubbing your toe.  Overusing your toe or doing repetitive exercise.  Twisting or stretching your toe out of place.  What increases the risk? This condition is more likely to develop in people who:  Play contact sports.  Have a bone disease.  Have a low calcium level.  What are the signs or symptoms? The main symptoms of this condition are swelling and pain in the toe. The pain may get worse with standing or walking. Other symptoms include:  Bruising.  Stiffness.  Numbness.  A change in the way the toe looks.  Broken bones that poke through the skin.  Blood beneath the toenail.  How is this diagnosed? This condition is diagnosed with a physical exam. You may also have X-rays. How is this treated? Treatment for this condition depends on the type of fracture and its severity. Treatment may involve:  Taping the broken toe to a toe that is next to it (buddy taping). This is the most common treatment for fractures in which the bone has not moved out of place (nondisplaced fracture).  Wearing a shoe that has a wide, rigid sole to protect the toe and to limit its movement.  Wearing a walking cast.  Having a procedure to move the toe back into place.  Surgery. This may be needed: ? If there are many pieces of broken bone that are out of place (displaced). ? If the toe joint breaks. ? If the bone breaks through the skin.  Physical therapy. This is done to help regain movement and strength in the  toe.  You may need follow-up X-rays to make sure that the bone is healing well and staying in position. Follow these instructions at home: If you have a cast:  Do not stick anything inside the cast to scratch your skin. Doing that increases your risk of infection.  Check the skin around the cast every day. Report any concerns to your health care provider. You may put lotion on dry skin around the edges of the cast. Do not apply lotion to the skin underneath the cast.  Do not put pressure on any part of the cast until it is fully hardened. This may take several hours.  Keep the cast clean and dry. Bathing  Do not take baths, swim, or use a hot tub until your health care provider approves. Ask your health care provider if you can take showers. You may only be allowed to take sponge baths for bathing.  If your health care provider approves bathing and showering, cover the cast or bandage (dressing) with a watertight plastic bag to protect it from water. Do not let the cast or dressing get wet. Managing pain, stiffness, and swelling  If you do not have a cast, apply ice to the injured area, if directed. ? Put ice in a plastic bag. ? Place a towel between your skin and the bag. ? Leave the ice on for 20  minutes, 2-3 times per day.  Move your toes often to avoid stiffness and to lessen swelling.  Raise (elevate) the injured area above the level of your heart while you are sitting or lying down. Driving  Do not drive or operate heavy machinery while taking pain medicine.  Do not drive while wearing a cast on a foot that you use for driving. Activity  Return to your normal activities as directed by your health care provider. Ask your health care provider what activities are safe for you.  Perform exercises daily as directed by your health care provider or physical therapist. Safety  Do not use the injured limb to support your body weight until your health care provider says that you  can. Use crutches or other assistive devices as directed by your health care provider. General instructions  If your toe was treated with buddy taping, follow your health care provider's instructions for changing the gauze and tape. Change it more often: ? The gauze and tape get wet. If this happens, dry the space between the toes. ? The gauze and tape are too tight and cause your toe to become pale or numb.  Wear a protective shoe as directed by your health care provider. If you were not given a protective shoe, wear sturdy, supportive shoes. Your shoes should not pinch your toes and should not fit tightly against your toes.  Do not use any tobacco products, including cigarettes, chewing tobacco, or e-cigarettes. Tobacco can delay bone healing. If you need help quitting, ask your health care provider.  Take medicines only as directed by your health care provider.  Keep all follow-up visits as directed by your health care provider. This is important. Contact a health care provider if:  You have a fever.  Your pain medicine is not helping.  Your toe is cold.  Your toe is numb.  You still have pain after one week of rest and treatment.  You still have pain after your health care provider has said that you can start walking again.  You have pain, tingling, or numbness in your foot that is not going away. Get help right away if:  You have severe pain.  You have redness or inflammation in your toe that is getting worse.  You have pain or numbness in your toe that is getting worse.  Your toe turns blue. This information is not intended to replace advice given to you by your health care provider. Make sure you discuss any questions you have with your health care provider. Document Released: 07/16/2000 Document Revised: 03/22/2016 Document Reviewed: 05/15/2014 Elsevier Interactive Patient Education  2018 [employer].    IF you received an x-ray today, you will receive an invoice  from [facility]. Please contact [facility] at [phone] with questions or concerns regarding your invoice.   IF you received labwork today, you will receive an invoice from [place]. Please contact [facility] at [phone] with questions or concerns regarding your invoice.   Our billing staff will not be able to assist you with questions regarding bills from these companies.  You will be contacted with the lab results as soon as they are available. The fastest way to get your results is to activate your My Chart account. Instructions are located on the last page of this paperwork. If you have not heard from [place] regarding the results in 2 weeks, please contact this office.

## 2017-09-06 NOTE — Telephone Encounter (Signed)
Please Advise

## 2017-09-07 ENCOUNTER — Encounter: Payer: Self-pay | Admitting: Family Medicine

## 2017-09-26 ENCOUNTER — Other Ambulatory Visit: Payer: Self-pay | Admitting: Family Medicine

## 2017-09-26 DIAGNOSIS — E785 Hyperlipidemia, unspecified: Secondary | ICD-10-CM

## 2017-10-31 DIAGNOSIS — H905 Unspecified sensorineural hearing loss: Secondary | ICD-10-CM | POA: Diagnosis not present

## 2017-10-31 DIAGNOSIS — H903 Sensorineural hearing loss, bilateral: Secondary | ICD-10-CM | POA: Diagnosis not present

## 2017-10-31 DIAGNOSIS — H9312 Tinnitus, left ear: Secondary | ICD-10-CM | POA: Diagnosis not present

## 2017-11-02 ENCOUNTER — Other Ambulatory Visit: Payer: Self-pay | Admitting: Physician Assistant

## 2017-11-02 DIAGNOSIS — H905 Unspecified sensorineural hearing loss: Secondary | ICD-10-CM

## 2017-11-02 DIAGNOSIS — H9312 Tinnitus, left ear: Secondary | ICD-10-CM

## 2017-11-02 DIAGNOSIS — H903 Sensorineural hearing loss, bilateral: Secondary | ICD-10-CM

## 2017-11-18 ENCOUNTER — Other Ambulatory Visit: Payer: Self-pay

## 2017-11-18 ENCOUNTER — Ambulatory Visit
Admission: RE | Admit: 2017-11-18 | Discharge: 2017-11-18 | Disposition: A | Discharge status: Home / Self Care | Payer: 59 | Source: Ambulatory Visit | Attending: Physician Assistant | Admitting: Physician Assistant

## 2017-11-18 DIAGNOSIS — H905 Unspecified sensorineural hearing loss: Secondary | ICD-10-CM

## 2017-11-18 DIAGNOSIS — H903 Sensorineural hearing loss, bilateral: Secondary | ICD-10-CM

## 2017-11-18 DIAGNOSIS — H9312 Tinnitus, left ear: Secondary | ICD-10-CM | POA: Diagnosis not present

## 2017-11-18 IMAGING — MR MR HEAD WO/W CM
12 of 13 series · 41 of 48 positions shown · IV contrast (multihance)
Comparison: None.

CLINICAL DATA: Asymmetric sensorineural hearing loss. Left-sided
tinnitus. Symptoms are chronic without progression or resolution.

EXAM:
MRI HEAD WITHOUT AND WITH CONTRAST
TECHNIQUE: Multiplanar, multiecho pulse sequences of the brain and surrounding
structures were obtained without and with intravenous contrast.
CONTRAST:  20mL MULTIHANCE GADOBENATE DIMEGLUMINE 529 MG/ML IV SOLN

[Series 2: T1 · sagittal · 5.0mm · 0.45mm/px · 3 of 23 slices shown (1 of 3)]
[im 1/23]
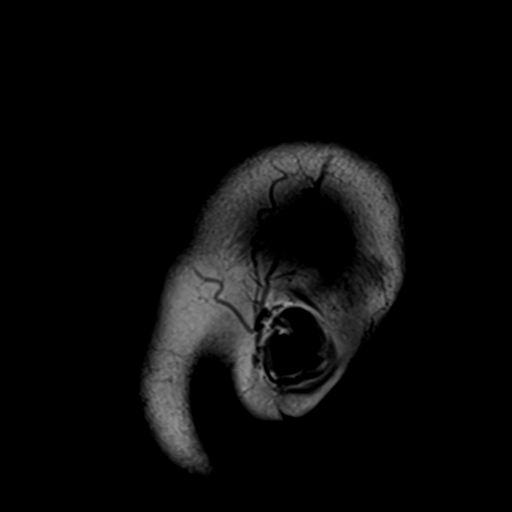
[im 12/23]
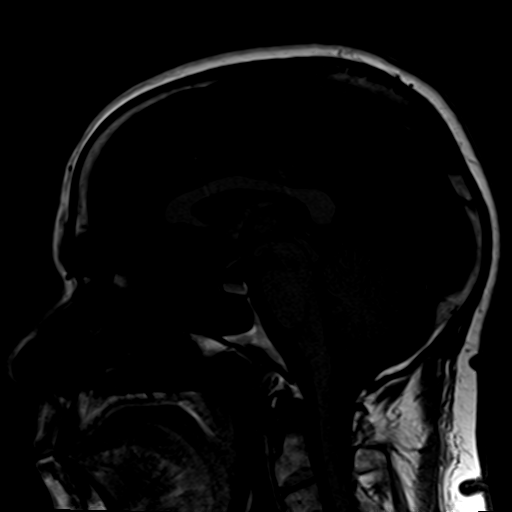
[im 23/23]
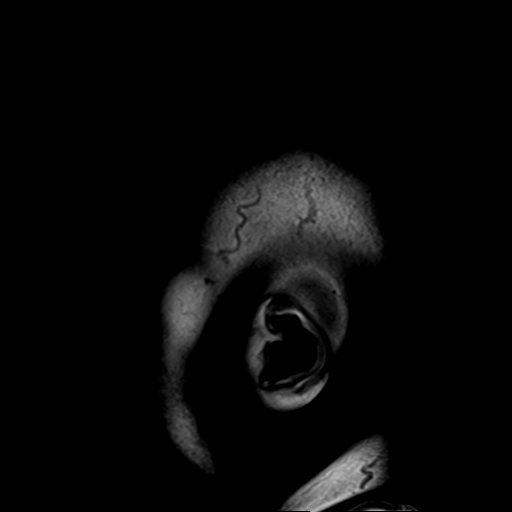

[Series 3: DWI · axial · 3.0mm · 1.80mm/px · z∈[-44,+109]mm · 9 of 104 slices shown (1 of 2)]
[im 1/104]
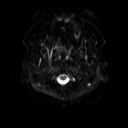
[im 13/104]
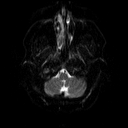
[im 26/104]
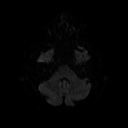
[im 39/104]
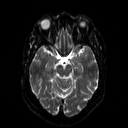
[im 52/104]
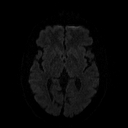
[im 65/104]
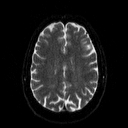
[im 78/104]
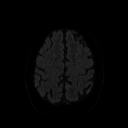
[im 91/104]
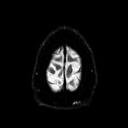
[im 104/104]
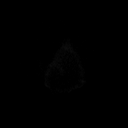

[Series 4: DWI · axial · 3.0mm · 1.80mm/px · z∈[-44,+109]mm · 4 of 51 slices shown (2 of 2)]
[im 1/51]
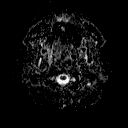
[im 17/51]
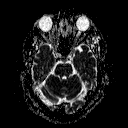
[im 34/51]
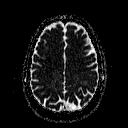
[im 51/51]
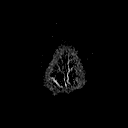

[Series 5: T2 · axial · 5.0mm · 0.45mm/px · z∈[-45,+111]mm · 2 of 25 slices shown]
[im 1/25]
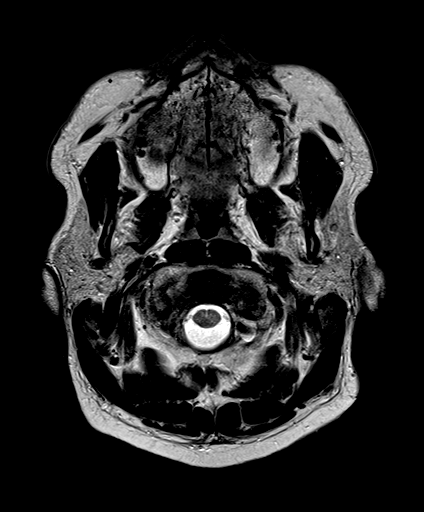
[im 25/25]
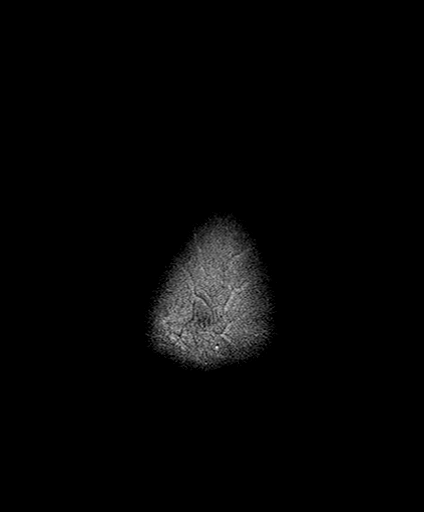

[Series 6: FLAIR · axial · 3.0mm · 0.45mm/px · z∈[-44,+109]mm · 3 of 34 slices shown]
[im 1/34]
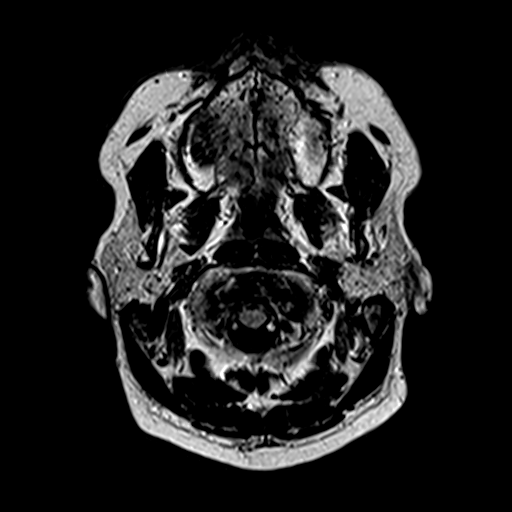
[im 17/34]
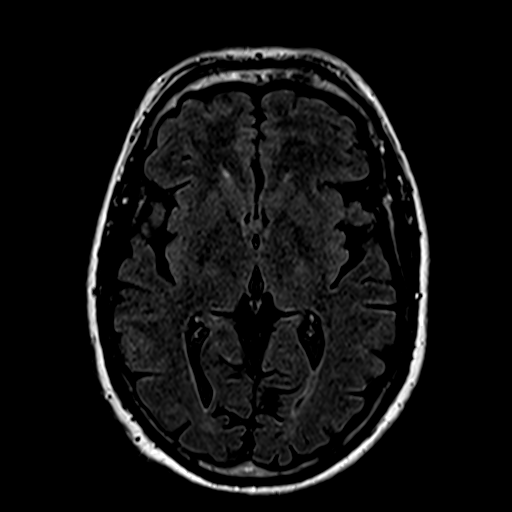
[im 34/34]
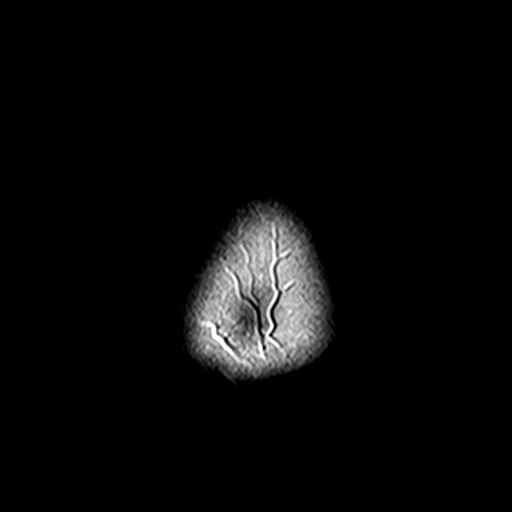

[Series 7: mip_images(sw) · axial · 16.0mm · 0.90mm/px · z∈[-39,+105]mm · 6 of 73 slices shown]
[im 1/73]
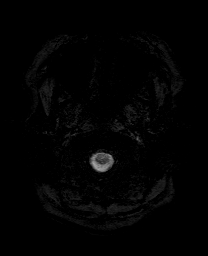
[im 15/73]
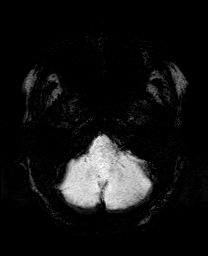
[im 29/73]
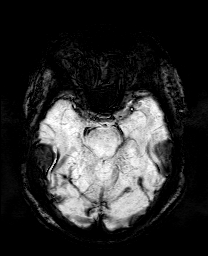
[im 44/73]
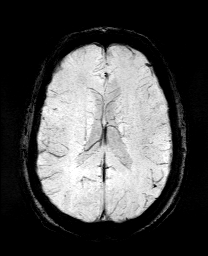
[im 58/73]
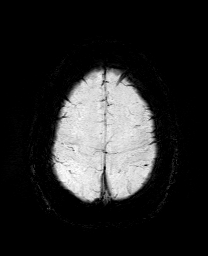
[im 73/73]
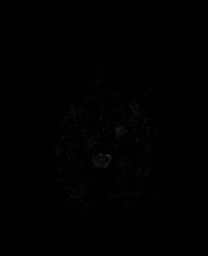

[Series 8: swi_images · axial · 2.0mm · 0.90mm/px · z∈[-46,+112]mm · 7 of 80 slices shown]
[im 1/80]
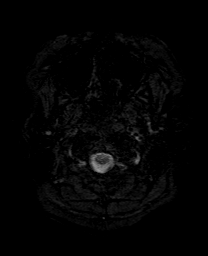
[im 14/80]
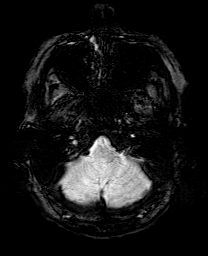
[im 27/80]
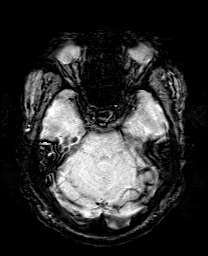
[im 40/80]
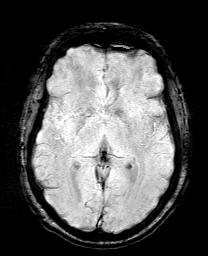
[im 53/80]
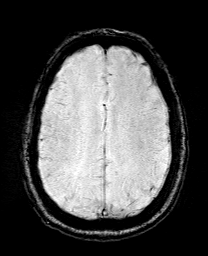
[im 66/80]
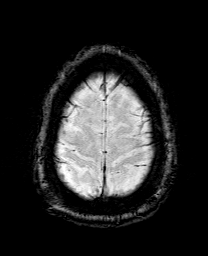
[im 80/80]
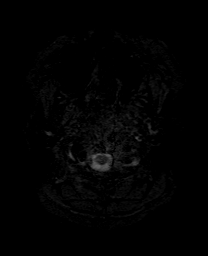

[Series 9: T1 · coronal · 3.0mm · 0.35mm/px · 1 of 13 slices shown (2 of 3)]
[im 1/13]
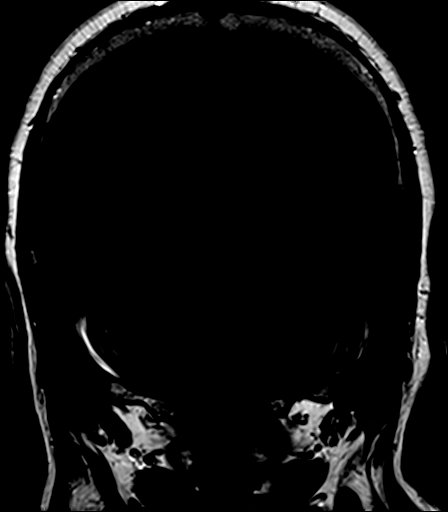

[Series 10: T1 · axial · 3.0mm · 0.35mm/px · 1 of 13 slices shown (3 of 3)]
[im 1/13]
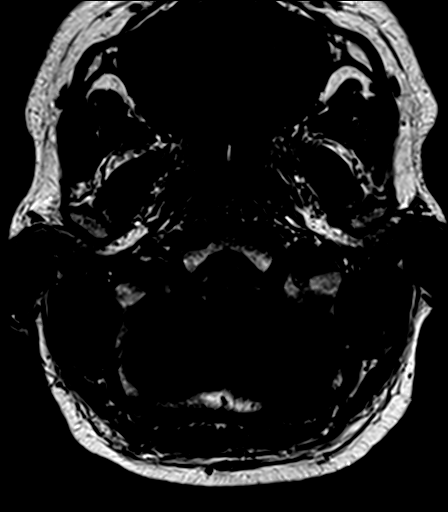

[Series 12: bSSFP · axial · 1.0mm · 0.28mm/px · z∈[-21,+14]mm · 3 of 36 slices shown]
[im 1/36]
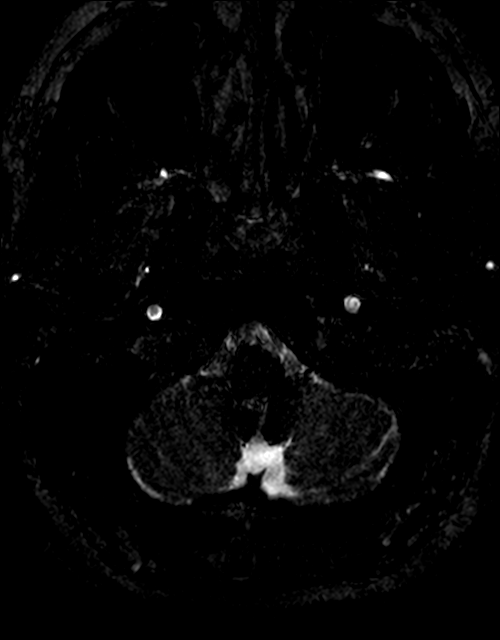
[im 18/36]
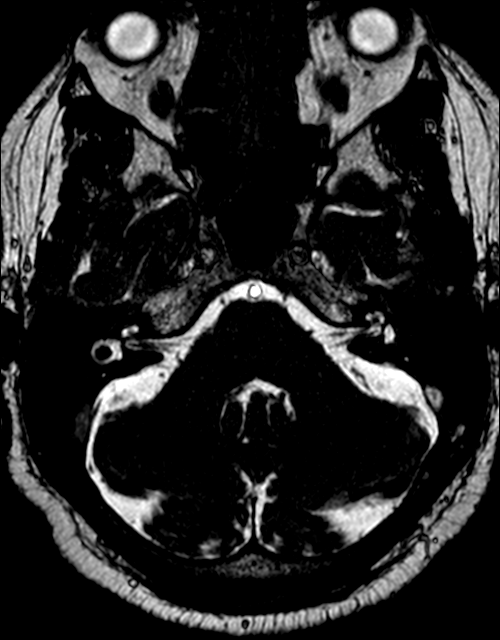
[im 36/36]
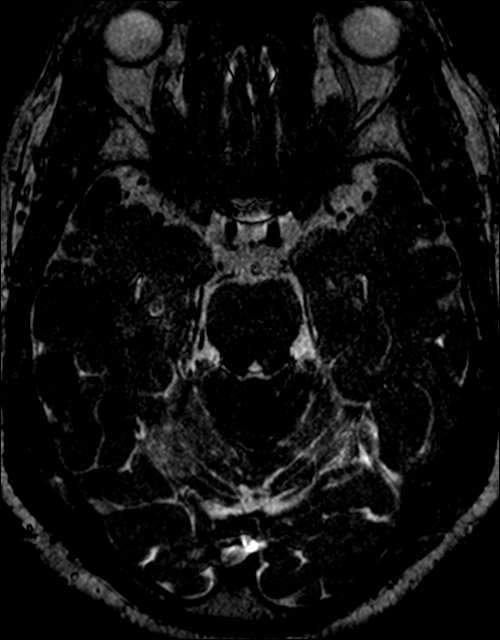

[Series 13: T1 post-contrast · coronal · 3.0mm · 0.35mm/px · 1 of 13 slices shown (1 of 2)]
[im 1/13]
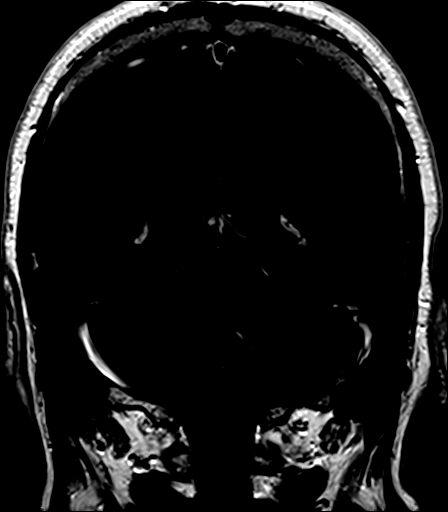

[Series 16: T1 post-contrast · axial · 3.0mm · 0.35mm/px · 1 of 13 slices shown (2 of 2)]
[im 1/13]
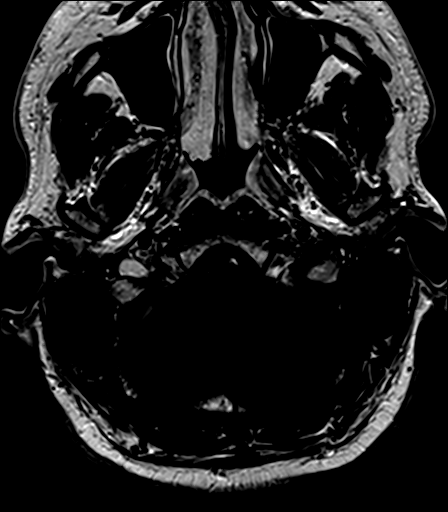

[41 of 48 positions shown; findings below may reference images not displayed]

FINDINGS: Brain: Conventional brain imaging demonstrates no acute infarct,
hemorrhage, or mass lesion. Ventricles are of normal size. No
significant white matter disease is present.

Brainstem and cerebellum are normal.

Postcontrast imaging the brain is within normal.

Vascular: Flow is present in the major intracranial arteries.

Skull and upper cervical spine: The skull base is within normal
limits. Craniocervical junction is normal.

Sinuses/Orbits: The paranasal sinuses are clear. A remote medial
blowout fracture is present in the left orbit. Globes and orbits are
otherwise within normal limits. The mastoid air cells are clear.

Other: Dedicated imaging of the internal auditory canals
demonstrates no pathologic enhancement.

High-resolution imaging demonstrates a discrete appearance of the
seventh and eighth cranial nerves. The inner ear structures are
normally formed. Fluid is seen throughout the cochlear vestibular
system.
IMPRESSION: 1. No acute or focal lesion to explain asymmetric sensorineural
hearing loss or tinnitus.
2. Normal MRI appearance of the brain.

## 2017-11-18 MED ORDER — GADOBENATE DIMEGLUMINE 529 MG/ML IV SOLN
20.0000 mL | Freq: Once | INTRAVENOUS | Status: AC | PRN
  Administered 2017-11-18: 20 mL via INTRAVENOUS

## 2017-12-20 ENCOUNTER — Other Ambulatory Visit: Payer: Self-pay

## 2017-12-20 ENCOUNTER — Encounter: Payer: Self-pay | Admitting: Family Medicine

## 2017-12-20 ENCOUNTER — Ambulatory Visit (INDEPENDENT_AMBULATORY_CARE_PROVIDER_SITE_OTHER): Admitting: Family Medicine

## 2017-12-20 VITALS — BP 138/78 | HR 89 | Temp 98.4°F | Resp 18 | Ht 69.96 in | Wt 209.2 lb

## 2017-12-20 DIAGNOSIS — I10 Essential (primary) hypertension: Secondary | ICD-10-CM

## 2017-12-20 DIAGNOSIS — Z23 Encounter for immunization: Secondary | ICD-10-CM | POA: Diagnosis not present

## 2017-12-20 DIAGNOSIS — Z125 Encounter for screening for malignant neoplasm of prostate: Secondary | ICD-10-CM

## 2017-12-20 DIAGNOSIS — M109 Gout, unspecified: Secondary | ICD-10-CM | POA: Diagnosis not present

## 2017-12-20 DIAGNOSIS — Z0000 Encounter for general adult medical examination without abnormal findings: Secondary | ICD-10-CM

## 2017-12-20 DIAGNOSIS — E785 Hyperlipidemia, unspecified: Secondary | ICD-10-CM

## 2017-12-20 MED ORDER — ATORVASTATIN CALCIUM 20 MG PO TABS: ORAL_TABLET | ORAL | 2 refills | Status: DC

## 2017-12-20 MED ORDER — ALLOPURINOL 100 MG PO TABS: 100.0000 mg | ORAL_TABLET | Freq: Every day | ORAL | 2 refills | Status: DC

## 2017-12-20 MED ORDER — ZOSTER VAC RECOMB ADJUVANTED 50 MCG/0.5ML IM SUSR: 0.5000 mL | Freq: Once | INTRAMUSCULAR | 1 refills | Status: AC

## 2017-12-20 MED ORDER — LISINOPRIL 20 MG PO TABS: 20.0000 mg | ORAL_TABLET | Freq: Every day | ORAL | 2 refills | Status: DC

## 2017-12-20 NOTE — Patient Instructions (Addendum)
Depending on PSA level, I may have you meet with urology.  No change in meds for now.  Shingles vaccine sent to your pharmacy.  Keep up the good work with dist/exercise for weight management.  Thanks for coming in today.   Keeping you healthy  Get these tests  Blood pressure- Have your blood pressure checked once a year by your healthcare provider.  Normal blood pressure is 120/80  Weight- Have your body mass index (BMI) calculated to screen for obesity.  BMI is a measure of body fat based on height and weight. You can also calculate your own BMI at [email or web address].  Cholesterol- Have your cholesterol checked every year.  Diabetes- Have your blood sugar checked regularly if you have high blood pressure, high cholesterol, have a family history of diabetes or if you are overweight.  Screening for Colon Cancer- Colonoscopy starting at age 28.  Screening may begin sooner depending on your family history and other health conditions. Follow up colonoscopy as directed by your Gastroenterologist.  Screening for Prostate Cancer- Both blood work (PSA) and a rectal exam help screen for Prostate Cancer.  Screening begins at age 32 with African-American men and at age 41 with Caucasian men.  Screening may begin sooner depending on your family history.  Take these medicines  Flu shot- Every fall.  Tetanus- Every 10 years.  shingles vaccine at your pharmacy.   Pneumonia shot- Once after the age of 68; if you are younger than 67, ask your healthcare provider if you need a Pneumonia shot.  Take these steps  Don't smoke- If you do smoke, talk to your doctor about quitting.  For tips on how to quit, go to www.smokefree.gov or call 1-800-QUIT-NOW.  Be physically active- Exercise 5 days a week for at least 30 minutes.  If you are not already physically active start slow and gradually work up to 30 minutes of moderate physical activity.  Examples of moderate activity include walking briskly,  mowing the yard, dancing, swimming, bicycling, etc.  Eat a healthy diet- Eat a variety of healthy food such as fruits, vegetables, low fat milk, low fat cheese, yogurt, lean meant, poultry, fish, beans, tofu, etc. For more information go to www.thenutritionsource.org  Drink alcohol in moderation- Limit alcohol intake to less than two drinks a day. Never drink and drive.  Dentist- Brush and floss twice daily; visit your dentist twice a year.  Depression- Your emotional health is as important as your physical health. If you're feeling down, or losing interest in things you would normally enjoy please talk to your healthcare provider.  Eye exam- Visit your eye doctor every year.  Safe sex- If you may be exposed to a sexually transmitted infection, use a condom.  Seat belts- Seat belts can save your life; always wear one.  Smoke/Carbon Monoxide detectors- These detectors need to be installed on the appropriate level of your home.  Replace batteries at least once a year.  Skin cancer- When out in the sun, cover up and use sunscreen 15 SPF or higher.  Violence- If anyone is threatening you, please tell your healthcare provider.  Living Will/ Health care power of attorney- Speak with your healthcare provider and family.    IF you received an x-ray today, you will receive an invoice from [facility]. Please contact [facility] at [phone] with questions or concerns regarding your invoice.   IF you received labwork today, you will receive an invoice from [place]. Please contact [facility] at [phone] with questions  or concerns regarding your invoice.   Our billing staff will not be able to assist you with questions regarding bills from these companies.  You will be contacted with the lab results as soon as they are available. The fastest way to get your results is to activate your My Chart account. Instructions are located on the last page of this paperwork. If you  have not heard from [place] regarding the results in 2 weeks, please contact this office.

## 2017-12-20 NOTE — Progress Notes (Signed)
Subjective:  By signing my name below, I, Daniel Davis, attest that this documentation has been prepared under the direction and in the presence of Daniel Agreste, MD Electronically Signed: Ladene Davis, ED Scribe 12/20/2017 at 2:11 PM.   Patient ID: Daniel Loges., male    DOB: 12-15-1955, 62 y.o.   MRN: [id number]  Chief Complaint  Patient presents with  . Annual Exam   HPI Daniel Davis. is a 62 y.o. male who presents to Primary Care at [facility] for an annual exam. H/o HTN, hyperlipidemia, gout.  HTN Lisinopril 20 mg qd. - Reports slight cough but not daily. Denies any other symptoms. Lab Results  Component Value Date   CREATININE 0.78 06/20/2017   BP Readings from Last 3 Encounters:  12/20/17 (!) 144/88  09/05/17 118/76  06/20/17 136/68   Hyperlipidemia Lab Results  Component Value Date   CHOL 143 06/20/2017   HDL 54 06/20/2017   LDLCALC 37 06/20/2017   TRIG 258 (H) 06/20/2017   CHOLHDL 2.6 06/20/2017   Lab Results  Component Value Date   ALT 20 06/20/2017   AST 31 06/20/2017   ALKPHOS 55 06/20/2017   BILITOT 0.2 06/20/2017  Lipitor 20 mg qd. - Denies myalgias, abdominal pain, any other symptoms.  Gout Allopurinol 100 mg qd. Indocin 50 mg prn. - Denies any gout flares since last visit. Lab Results  Component Value Date   LABURIC 4.6 06/20/2017   CA Screening Colonoscopy: Family h/o colon CA in mom, 5 yr screenings. Colonoscopy 03/12/15 by Dr. Deatra Ina, moderate diverticulosis. Prostate CA Screening: PSA 4.0 on 12/16/16. Due to increased PSA, referred to urology. Discussed urology eval for increase from prev PSA but still within normal range. - Pt agrees to DRE and PSA at this visit. Denies hematuria, difficulty urinating, difficulty starting/stopping stream, urinary frequency, nocturia. Pt wakes twice during the night to urinate which he states has been his norm for several yrs. Lab Results  Component Value Date   PSA 2.60 12/24/2015   PSA 2.37  12/16/2014   PSA 1.96 11/26/2013   Immunizations Immunization History  Administered Date(s) Administered  . Influenza Nasal 05/16/2014, 05/18/2016, 05/05/2017  . Influenza,inj,Quad PF,6+ Mos 06/16/2015  . Tdap 12/27/2007  Shingrix: sent to pharmacy  Depression Screening Depression screen [facility] 2/9 12/20/2017 09/05/2017 06/20/2017 05/16/2017 05/16/2017  Decreased Interest 0 0 0 0 0  Down, Depressed, Hopeless 0 0 0 0 0  PHQ - 2 Score 0 0 0 0 0     Visual Acuity Screening   Right eye Left eye Both eyes  Without correction: 20/20 20/20 20/13   With correction:      Vision: pt has not seen an eye doctor in a few yrs Dentist: regularly; next appointment is next week Exercise: does a lot of yard work  Review of Systems  Respiratory: Negative for cough.   Gastrointestinal: Negative for abdominal pain.  Genitourinary: Negative for difficulty urinating, frequency and hematuria.  Musculoskeletal: Negative for myalgias.  All other systems reviewed and are negative. 13 point ROS is neg    Objective:   Physical Exam  Constitutional: He is oriented to person, place, and time. He appears well-developed and well-nourished.  HENT:  Head: Normocephalic and atraumatic.  Right Ear: External ear normal.  Left Ear: External ear normal.  Mouth/Throat: Oropharynx is clear and moist.  Eyes: Pupils are equal, round, and reactive to light. Conjunctivae and EOM are normal.  Neck: Normal range of motion. Neck supple. No thyromegaly present.  Cardiovascular: Normal rate, regular rhythm, normal heart sounds and intact distal pulses.  Pulmonary/Chest: Effort normal and breath sounds normal. No respiratory distress. He has no wheezes.  Abdominal: Soft. He exhibits no distension. There is no tenderness. Hernia confirmed negative in the right inguinal area and confirmed negative in the left inguinal area.  Genitourinary: Prostate normal.  Musculoskeletal: Normal range of motion. He exhibits no edema or  tenderness.  Lymphadenopathy:    He has no cervical adenopathy.  Neurological: He is alert and oriented to person, place, and time. He has normal reflexes.  Skin: Skin is warm and dry. Abrasion noted.  Abrasion on L anterior shin without apparent signs of infection  Psychiatric: He has a normal mood and affect. His behavior is normal.  Vitals reviewed. . Vitals:   12/20/17 1336 12/20/17 1412  BP: (!) 144/88 138/78  Pulse: 89   Resp: 18   Temp: 98.4 F (36.9 C)   TempSrc: Oral   SpO2: 97%   Weight: 209 lb 3.2 oz (94.9 kg)   Height: 5' 9.96" (1.777 m)       Assessment & Plan:   Daniel Krauser. is a 62 y.o. male Annual physical exam  - -anticipatory guidance as below in AVS, screening labs above. Health maintenance items as above in HPI discussed/recommended as applicable.   Essential hypertension - Plan: lisinopril (PRINIVIL,ZESTRIL) 20 MG tablet, Comprehensive metabolic panel  - stable on recheck. No med changes for now.   Gout of multiple sites, unspecified cause, unspecified chronicity - Plan: allopurinol (ZYLOPRIM) 100 MG tablet  - controlled, tolerating allopurinol.  Continue same dose with RTC precautions   Hyperlipidemia, unspecified hyperlipidemia type - Plan: atorvastatin (LIPITOR) 20 MG tablet, Lipid panel, Comprehensive metabolic panel  - check labs, tolerating lipitor. Continue same.   Screening for prostate cancer - Plan: PSA  - questionable firm area on exam without apparent nodule on current exam. Slight increase in prior psa from prior readings. PSA repeated with possible urology follow up.   Need for shingles vaccine - Plan: Zoster Vaccine Adjuvanted [facility]) injection sent to pharmacy.    Meds ordered this encounter  Medications  . lisinopril (PRINIVIL,ZESTRIL) 20 MG tablet    Sig: Take 1 tablet (20 mg total) by mouth daily.    Dispense:  90 tablet    Refill:  2  . atorvastatin (LIPITOR) 20 MG tablet    Sig: take 1 tablet by mouth at bedtime     Dispense:  90 tablet    Refill:  2  . allopurinol (ZYLOPRIM) 100 MG tablet    Sig: Take 1 tablet (100 mg total) by mouth daily.    Dispense:  90 tablet    Refill:  2  . Zoster Vaccine Adjuvanted [facility]) injection    Sig: Inject 0.5 mLs into the muscle once for 1 dose. Repeat in 2-6 months.    Dispense:  0.5 mL    Refill:  1   Patient Instructions    Depending on PSA level, I may have you meet with urology.  No change in meds for now.  Shingles vaccine sent to your pharmacy.  Keep up the good work with dist/exercise for weight management.  Thanks for coming in today.   Keeping you healthy  Get these tests  Blood pressure- Have your blood pressure checked once a year by your healthcare provider.  Normal blood pressure is 120/80  Weight- Have your body mass index (BMI) calculated to screen for obesity.  BMI is  a measure of body fat based on height and weight. You can also calculate your own BMI at View[employer].si.  Cholesterol- Have your cholesterol checked every year.  Diabetes- Have your blood sugar checked regularly if you have high blood pressure, high cholesterol, have a family history of diabetes or if you are overweight.  Screening for Colon Cancer- Colonoscopy starting at age 62.  Screening may begin sooner depending on your family history and other health conditions. Follow up colonoscopy as directed by your Gastroenterologist.  Screening for Prostate Cancer- Both blood work (PSA) and a rectal exam help screen for Prostate Cancer.  Screening begins at age 28 with African-American men and at age 47 with Caucasian men.  Screening may begin sooner depending on your family history.  Take these medicines  Flu shot- Every fall.  Tetanus- Every 10 years.  shingles vaccine at your pharmacy.   Pneumonia shot- Once after the age of 47; if you are younger than 84, ask your healthcare provider if you need a Pneumonia shot.  Take these steps  Don't smoke- If you  do smoke, talk to your doctor about quitting.  For tips on how to quit, go to www.smokefree.gov or call 1-800-QUIT-NOW.  Be physically active- Exercise 5 days a week for at least 30 minutes.  If you are not already physically active start slow and gradually work up to 30 minutes of moderate physical activity.  Examples of moderate activity include walking briskly, mowing the yard, dancing, swimming, bicycling, etc.  Eat a healthy diet- Eat a variety of healthy food such as fruits, vegetables, low fat milk, low fat cheese, yogurt, lean meant, poultry, fish, beans, tofu, etc. For more information go to www.thenutritionsource.org  Drink alcohol in moderation- Limit alcohol intake to less than two drinks a day. Never drink and drive.  Dentist- Brush and floss twice daily; visit your dentist twice a year.  Depression- Your emotional health is as important as your physical health. If you're feeling down, or losing interest in things you would normally enjoy please talk to your healthcare provider.  Eye exam- Visit your eye doctor every year.  Safe sex- If you may be exposed to a sexually transmitted infection, use a condom.  Seat belts- Seat belts can save your life; always wear one.  Smoke/Carbon Monoxide detectors- These detectors need to be installed on the appropriate level of your home.  Replace batteries at least once a year.  Skin cancer- When out in the sun, cover up and use sunscreen 15 SPF or higher.  Violence- If anyone is threatening you, please tell your healthcare provider.  Living Will/ Health care power of attorney- Speak with your healthcare provider and family.    IF you received an x-ray today, you will receive an invoice from [facility]. Please contact [facility] at [phone] with questions or concerns regarding your invoice.   IF you received labwork today, you will receive an invoice from [facility]. Please contact [facility] at [phone] with  questions or concerns regarding your invoice.   Our billing staff will not be able to assist you with questions regarding bills from these companies.  You will be contacted with the lab results as soon as they are available. The fastest way to get your results is to activate your My Chart account. Instructions are located on the last page of this paperwork. If you have not heard from [place] regarding the results in 2 weeks, please contact this office.      I personally  performed the services described in this documentation, which was scribed in my presence. The recorded information has been reviewed and considered for accuracy and completeness, addended by me as needed, and agree with information above.  Signed,   Merri Ray, MD [facility] at [facility].  12/21/17 10:41 PM

## 2017-12-21 LAB — COMPREHENSIVE METABOLIC PANEL
ALBUMIN: 4.4 g/dL (ref 3.6–4.8)
ALK PHOS: 61 IU/L (ref 39–117)
ALT: 18 IU/L (ref 0–44)
AST: 30 IU/L (ref 0–40)
Albumin/Globulin Ratio: 1.7 (ref 1.2–2.2)
BILIRUBIN TOTAL: 0.5 mg/dL (ref 0.0–1.2)
BUN / CREAT RATIO: 12 (ref 10–24)
BUN: 10 mg/dL (ref 8–27)
CHLORIDE: 99 mmol/L (ref 96–106)
CO2: 23 mmol/L (ref 20–29)
Calcium: 9.6 mg/dL (ref 8.6–10.2)
Creatinine, Ser: 0.82 mg/dL (ref 0.76–1.27)
GFR calc Af Amer: 111 mL/min/{1.73_m2} (ref >59)
GFR calc non Af Amer: 96 mL/min/{1.73_m2} (ref >59)
GLOBULIN, TOTAL: 2.6 g/dL (ref 1.5–4.5)
GLUCOSE: 86 mg/dL (ref 65–99)
POTASSIUM: 4.4 mmol/L (ref 3.5–5.2)
SODIUM: 137 mmol/L (ref 134–144)
TOTAL PROTEIN: 7 g/dL (ref 6.0–8.5)

## 2017-12-21 LAB — LIPID PANEL
CHOLESTEROL TOTAL: 133 mg/dL (ref 100–199)
Chol/HDL Ratio: 2.5 ratio (ref 0.0–5.0)
HDL: 54 mg/dL (ref >39)
LDL Calculated: 29 mg/dL (ref 0–99)
TRIGLYCERIDES: 250 mg/dL — AB (ref 0–149)
VLDL CHOLESTEROL CAL: 50 mg/dL — AB (ref 5–40)

## 2017-12-21 LAB — PSA: PROSTATE SPECIFIC AG, SERUM: 5.4 ng/mL — AB (ref 0.0–4.0)

## 2018-06-21 ENCOUNTER — Telehealth: Payer: Self-pay | Admitting: Family Medicine

## 2018-06-21 NOTE — Telephone Encounter (Signed)
LVM for pt to make sure his appt on 06/26/18 at 3:40 with Dr. Carlota Raspberry was still good for him (see CRM [phone]). I advised pt to call the office if he needed to rescheduled.

## 2018-06-21 NOTE — Telephone Encounter (Signed)
Pt has confirmed that he will still be seening Dr. Carlota Raspberry on 06/26/18

## 2018-06-26 ENCOUNTER — Ambulatory Visit (INDEPENDENT_AMBULATORY_CARE_PROVIDER_SITE_OTHER): Payer: 59 | Admitting: Family Medicine

## 2018-06-26 ENCOUNTER — Encounter: Payer: Self-pay | Admitting: Family Medicine

## 2018-06-26 ENCOUNTER — Other Ambulatory Visit: Payer: Self-pay

## 2018-06-26 ENCOUNTER — Encounter

## 2018-06-26 VITALS — BP 138/86 | HR 87 | Temp 97.8°F | Ht 71.0 in | Wt 214.0 lb

## 2018-06-26 DIAGNOSIS — R3989 Other symptoms and signs involving the genitourinary system: Secondary | ICD-10-CM | POA: Diagnosis not present

## 2018-06-26 DIAGNOSIS — I10 Essential (primary) hypertension: Secondary | ICD-10-CM | POA: Diagnosis not present

## 2018-06-26 DIAGNOSIS — M109 Gout, unspecified: Secondary | ICD-10-CM

## 2018-06-26 DIAGNOSIS — R972 Elevated prostate specific antigen [PSA]: Secondary | ICD-10-CM

## 2018-06-26 DIAGNOSIS — E785 Hyperlipidemia, unspecified: Secondary | ICD-10-CM

## 2018-06-26 MED ORDER — ALLOPURINOL 100 MG PO TABS: 100.0000 mg | ORAL_TABLET | Freq: Every day | ORAL | 2 refills | Status: DC

## 2018-06-26 MED ORDER — LISINOPRIL 20 MG PO TABS: 20.0000 mg | ORAL_TABLET | Freq: Every day | ORAL | 2 refills | Status: DC

## 2018-06-26 MED ORDER — ATORVASTATIN CALCIUM 20 MG PO TABS: ORAL_TABLET | ORAL | 2 refills | Status: DC

## 2018-06-26 NOTE — Patient Instructions (Addendum)
I will repeat the PSA level today, but have placed a referral to urology to evaluate the possible abnormal exam as well as a elevated PSA test. Let me know if you have questions.   Keep a record of your blood pressures outside of the office and if over 140/90 - return to change meds.   No other med changes. Thanks for coming in today.   Return to the clinic or go to the nearest emergency room if any of your symptoms worsen or new symptoms occur.  Hypertension Hypertension, commonly called high blood pressure, is when the force of blood pumping through the arteries is too strong. The arteries are the blood vessels that carry blood from the heart throughout the body. Hypertension forces the heart to work harder to pump blood and may cause arteries to become narrow or stiff. Having untreated or uncontrolled hypertension can cause heart attacks, strokes, kidney disease, and other problems. A blood pressure reading consists of a higher number over a lower number. Ideally, your blood pressure should be below 120/80. The first ("top") number is called the systolic pressure. It is a measure of the pressure in your arteries as your heart beats. The second ("bottom") number is called the diastolic pressure. It is a measure of the pressure in your arteries as the heart relaxes. What are the causes? The cause of this condition is not known. What increases the risk? Some risk factors for high blood pressure are under your control. Others are not. Factors you can change  Smoking.  Having type 2 diabetes mellitus, high cholesterol, or both.  Not getting enough exercise or physical activity.  Being overweight.  Having too much fat, sugar, calories, or salt (sodium) in your diet.  Drinking too much alcohol. Factors that are difficult or impossible to change  Having chronic kidney disease.  Having a family history of high blood pressure.  Age. Risk increases with age.  Race. You may be at higher  risk if you are African-American.  Gender. Men are at higher risk than women before age 32. After age 63, women are at higher risk than men.  Having obstructive sleep apnea.  Stress. What are the signs or symptoms? Extremely high blood pressure (hypertensive crisis) may cause:  Headache.  Anxiety.  Shortness of breath.  Nosebleed.  Nausea and vomiting.  Severe chest pain.  Jerky movements you cannot control (seizures).  How is this diagnosed? This condition is diagnosed by measuring your blood pressure while you are seated, with your arm resting on a surface. The cuff of the blood pressure monitor will be placed directly against the skin of your upper arm at the level of your heart. It should be measured at least twice using the same arm. Certain conditions can cause a difference in blood pressure between your right and left arms. Certain factors can cause blood pressure readings to be lower or higher than normal (elevated) for a short period of time:  When your blood pressure is higher when you are in a health care provider's office than when you are at home, this is called white coat hypertension. Most people with this condition do not need medicines.  When your blood pressure is higher at home than when you are in a health care provider's office, this is called masked hypertension. Most people with this condition may need medicines to control blood pressure.  If you have a high blood pressure reading during one visit or you have normal blood pressure with other  risk factors:  You may be asked to return on a different day to have your blood pressure checked again.  You may be asked to monitor your blood pressure at home for 1 week or longer.  If you are diagnosed with hypertension, you may have other blood or imaging tests to help your health care provider understand your overall risk for other conditions. How is this treated? This condition is treated by making healthy  lifestyle changes, such as eating healthy foods, exercising more, and reducing your alcohol intake. Your health care provider may prescribe medicine if lifestyle changes are not enough to get your blood pressure under control, and if:  Your systolic blood pressure is above 130.  Your diastolic blood pressure is above 80.  Your personal target blood pressure may vary depending on your medical conditions, your age, and other factors. Follow these instructions at home: Eating and drinking  Eat a diet that is high in fiber and potassium, and low in sodium, added sugar, and fat. An example eating plan is called the DASH (Dietary Approaches to Stop Hypertension) diet. To eat this way: ? Eat plenty of fresh fruits and vegetables. Try to fill half of your plate at each meal with fruits and vegetables. ? Eat whole grains, such as whole wheat pasta, brown rice, or whole grain bread. Fill about one quarter of your plate with whole grains. ? Eat or drink low-fat dairy products, such as skim milk or low-fat yogurt. ? Avoid fatty cuts of meat, processed or cured meats, and poultry with skin. Fill about one quarter of your plate with lean proteins, such as fish, chicken without skin, beans, eggs, and tofu. ? Avoid premade and processed foods. These tend to be higher in sodium, added sugar, and fat.  Reduce your daily sodium intake. Most people with hypertension should eat less than 1,500 mg of sodium a day.  Limit alcohol intake to no more than 1 drink a day for nonpregnant women and 2 drinks a day for men. One drink equals 12 oz of beer, 5 oz of wine, or 1 oz of hard liquor. Lifestyle  Work with your health care provider to maintain a healthy body weight or to lose weight. Ask what an ideal weight is for you.  Get at least 30 minutes of exercise that causes your heart to beat faster (aerobic exercise) most days of the week. Activities may include walking, swimming, or biking.  Include exercise to  strengthen your muscles (resistance exercise), such as pilates or lifting weights, as part of your weekly exercise routine. Try to do these types of exercises for 30 minutes at least 3 days a week.  Do not use any products that contain nicotine or tobacco, such as cigarettes and e-cigarettes. If you need help quitting, ask your health care provider.  Monitor your blood pressure at home as told by your health care provider.  Keep all follow-up visits as told by your health care provider. This is important. Medicines  Take over-the-counter and prescription medicines only as told by your health care provider. Follow directions carefully. Blood pressure medicines must be taken as prescribed.  Do not skip doses of blood pressure medicine. Doing this puts you at risk for problems and can make the medicine less effective.  Ask your health care provider about side effects or reactions to medicines that you should watch for. Contact a health care provider if:  You think you are having a reaction to a medicine you are taking.  You have headaches that keep coming back (recurring).  You feel dizzy.  You have swelling in your ankles.  You have trouble with your vision. Get help right away if:  You develop a severe headache or confusion.  You have unusual weakness or numbness.  You feel faint.  You have severe pain in your chest or abdomen.  You vomit repeatedly.  You have trouble breathing. Summary  Hypertension is when the force of blood pumping through your arteries is too strong. If this condition is not controlled, it may put you at risk for serious complications.  Your personal target blood pressure may vary depending on your medical conditions, your age, and other factors. For most people, a normal blood pressure is less than 120/80.  Hypertension is treated with lifestyle changes, medicines, or a combination of both. Lifestyle changes include weight loss, eating a healthy,  low-sodium diet, exercising more, and limiting alcohol. This information is not intended to replace advice given to you by your health care provider. Make sure you discuss any questions you have with your health care provider. Document Released: 07/19/2005 Document Revised: 06/16/2016 Document Reviewed: 06/16/2016 Elsevier Interactive Patient Education  [employer].   If you have lab work done today you will be contacted with your lab results within the next 2 weeks.  If you have not heard from [place] then please contact us. The fastest way to get your results is to register for My Chart.   IF you received an x-ray today, you will receive an invoice from [facility]. Please contact [facility] at [phone] with questions or concerns regarding your invoice.   IF you received labwork today, you will receive an invoice from [place]. Please contact [facility] at [phone] with questions or concerns regarding your invoice.   Our billing staff will not be able to assist you with questions regarding bills from these companies.  You will be contacted with the lab results as soon as they are available. The fastest way to get your results is to activate your My Chart account. Instructions are located on the last page of this paperwork. If you have not heard from [place] regarding the results in 2 weeks, please contact this office.

## 2018-06-26 NOTE — Progress Notes (Signed)
Subjective:  By signing my name below, I, Daniel Davis, attest that this documentation has been prepared under the direction and in the presence of Daniel Ray, MD. Electronically Signed: Moises Davis, [place]. 06/26/2018 , 4:30 PM .  Patient was seen in Room 8 .   Patient ID: Daniel Davis., male    DOB: 02-08-1956, 62 y.o.   MRN: [id number] Chief Complaint  Patient presents with  . Medication Refill    6 m f/u    HPI Daniel T [place] Coll. is a 62 y.o. male Here for follow up. Patient was last seen in May.   Gout Patient takes allopurinol 100 mg qd, and has indomethacin as needed. He denies any recent gout flares.   Lab Results  Component Value Date   LABURIC 4.6 06/20/2017    HTN BP Readings from Last 3 Encounters:  06/26/18 138/86  12/20/17 138/78  09/05/17 118/76   Lab Results  Component Value Date   CREATININE 0.82 12/20/2017   He takes Lisinopril 20 mg qd. He hasn't been checking his BP outside of office. He was able to get some stress off recently, as he was able to close his mom's estate. He denies missing any doses. He denies any chest pain, shortness of breath, lightheadedness, or dizziness. He has been exercising recently.   Hyperlipidemia Lab Results  Component Value Date   CHOL 133 12/20/2017   HDL 54 12/20/2017   LDLCALC 29 12/20/2017   TRIG 250 (H) 12/20/2017   CHOLHDL 2.5 12/20/2017   Lab Results  Component Value Date   ALT 18 12/20/2017   AST 30 12/20/2017   ALKPHOS 61 12/20/2017   BILITOT 0.5 12/20/2017   The 10-year ASCVD risk score Daniel Davis DC Jr., et al., 2013) is: 13.4%   Values used to calculate the score:     Age: 56 years     Sex: Male     Is Non-Hispanic African American: Yes     Diabetic: No     Tobacco smoker: No     Systolic Davis Pressure: 829 mmHg     Is BP treated: Yes     HDL Cholesterol: 54 mg/dL     Total Cholesterol: 133 mg/dL  He does take aspirin 81 mg qd.   Elevated PSA See last office visit. PSA had  increased from 4.0 to 5.4 over the past year. I tried to reach patient by phone, but ultimately, sent MyChart message on Aug 7th; recommended follow up with urology.   Lab Results  Component Value Date   PSA1 5.4 (H) 12/20/2017   PSA1 4.0 12/16/2016   Patient denies any troubles with urination including hematuria, or urgency. He notes waking because his dog wakes him up, and figures he would go to the bathroom at the same time; no more than 2 times a night. He denies night sweats or unexplained weight loss.   Patient Active Problem List   Diagnosis Date Noted  . Nonspecific abnormal finding in stool contents 02/28/2015  . Family history of cancer 02/28/2015  . OA (osteoarthritis) 02/28/2012  . HTN (hypertension) 11/27/2011  . Lipid disorder 11/27/2011  . Gout attack 11/27/2011   Past Medical History:  Diagnosis Date  . Arthritis   . Colon polyps   . Gout attack   . Hyperlipidemia   . Hypertension    Past Surgical History:  Procedure Laterality Date  . FRACTURE SURGERY  2000   LEFT ankle, ORIF   Allergies  Allergen Reactions  .  Gadolinium Derivatives Hives    11/18/17 developed 4-5 hives on neck and face; no other complaints.  Treated with Benadryl 25mg  PO (pt given 2 more caps to take with him for 3.5 mile drive home).  Needs Benadryl 50mg  PO pre-med prior to next MRI w/ gad, per Dr. Nelson Davis.   Prior to Admission medications   Medication Sig Start Date End Date Taking? Authorizing Provider  allopurinol (ZYLOPRIM) 100 MG tablet Take 1 tablet (100 mg total) by mouth daily. 12/20/17   Daniel Agreste, MD  aspirin 81 MG tablet Take 81 mg by mouth daily.    [provider]  atorvastatin (LIPITOR) 20 MG tablet take 1 tablet by mouth at bedtime 12/20/17   Daniel Agreste, MD  ferrous sulfate 325 (65 FE) MG tablet Take 325 mg by mouth daily with breakfast.    [provider]  indomethacin (INDOCIN) 50 MG capsule Take 1 capsule (50 mg total) 3 (three) times  daily as needed by mouth. for MILD PAIN(GOUT) 06/20/17   Daniel Agreste, MD  lisinopril (PRINIVIL,ZESTRIL) 20 MG tablet Take 1 tablet (20 mg total) by mouth daily. 12/20/17   Daniel Agreste, MD   Social History   Socioeconomic History  . Marital status: Married    Spouse name: Daniel Davis  . Number of children: 1  . Years of education: 14+  . Highest education level: Not on file  Occupational History  . Occupation: SHIFT SUPERVISOR    Employer: [employer]  Social Needs  . Financial resource strain: Not on file  . Food insecurity:    Worry: Not on file    Inability: Not on file  . Transportation needs:    Medical: Not on file    Non-medical: Not on file  Tobacco Use  . Smoking status: Never Smoker  . Smokeless tobacco: Never Used  Substance and Sexual Activity  . Alcohol use: Yes    Alcohol/week: 3.0 standard drinks    Types: 3 Standard drinks or equivalent per week    Comment: 3-5 drinks  . Drug use: No  . Sexual activity: Yes    Partners: Female  Lifestyle  . Physical activity:    Days per week: Not on file    Minutes per session: Not on file  . Stress: Not on file  Relationships  . Social connections:    Talks on phone: Not on file    Gets together: Not on file    Attends religious service: Not on file    Active member of club or organization: Not on file    Attends meetings of clubs or organizations: Not on file    Relationship status: Not on file  . Intimate partner violence:    Fear of current or ex partner: Not on file    Emotionally abused: Not on file    Physically abused: Not on file    Forced sexual activity: Not on file  Other Topics Concern  . Not on file  Social History Narrative   Lives with his wife and their daughter.   Exercise: Walking   Education: some College   Review of Systems  Constitutional: Negative for fatigue and unexpected weight change.  Eyes: Negative for visual disturbance.  Respiratory: Negative for cough, chest  tightness and shortness of breath.   Cardiovascular: Negative for chest pain, palpitations and leg swelling.  Gastrointestinal: Negative for abdominal pain and Davis in stool.  Neurological: Negative for dizziness, light-headedness and headaches.  Objective:   Physical Exam  Constitutional: He is oriented to person, place, and time. He appears well-developed and well-nourished.  HENT:  Head: Normocephalic and atraumatic.  Eyes: Pupils are equal, round, and reactive to light. EOM are normal.  Neck: No JVD present. Carotid bruit is not present.  Cardiovascular: Normal rate, regular rhythm and normal heart sounds.  No murmur heard. Pulmonary/Chest: Effort normal and breath sounds normal. He has no rales.  Musculoskeletal: He exhibits no edema.  Neurological: He is alert and oriented to person, place, and time.  Skin: Skin is warm and dry.  Psychiatric: He has a normal mood and affect.  Vitals reviewed.   Vitals:   06/26/18 1550 06/26/18 1555  BP: (!) 143/85 138/86  Pulse: 87   Temp: 97.8 F (36.6 C)   TempSrc: Oral   SpO2: 94%   Weight: 214 lb (97.1 kg)   Height: 5\' 11"  (1.803 m)        Assessment & Plan:    Rashod Gougeon. is a 62 y.o. male Elevated PSA - Plan: Ambulatory referral to Urology, PSA Abnormal prostate exam - Plan: Ambulatory referral to Urology, PSA  -Possible abnormal exam previously.  Repeat PSA has prior elevation.  Recent reading has improved but still think urology evaluation may be helpful for repeat exam based on prior abnormality.   Essential hypertension - Plan: lisinopril (PRINIVIL,ZESTRIL) 20 MG tablet, Comprehensive metabolic panel  -Borderline control, continue lisinopril same dose for now, labs pending, monitor home readings and if elevated would recommend changes.  Gout of multiple sites, unspecified cause, unspecified chronicity - Plan: allopurinol (ZYLOPRIM) 100 MG tablet  -Stable, continue same dose of  allopurinol.  Hyperlipidemia, unspecified hyperlipidemia type - Plan: atorvastatin (LIPITOR) 20 MG tablet, Lipid panel  - Stable, tolerating current regimen. Medications refilled. Labs pending as above.    Meds ordered this encounter  Medications  . lisinopril (PRINIVIL,ZESTRIL) 20 MG tablet    Sig: Take 1 tablet (20 mg total) by mouth daily.    Dispense:  90 tablet    Refill:  2  . allopurinol (ZYLOPRIM) 100 MG tablet    Sig: Take 1 tablet (100 mg total) by mouth daily.    Dispense:  90 tablet    Refill:  2  . atorvastatin (LIPITOR) 20 MG tablet    Sig: take 1 tablet by mouth at bedtime    Dispense:  90 tablet    Refill:  2   Patient Instructions   I will repeat the PSA level today, but have placed a referral to urology to evaluate the possible abnormal exam as well as a elevated PSA test. Let me know if you have questions.   Keep a record of your Davis pressures outside of the office and if over 140/90 - return to change meds.   No other med changes. Thanks for coming in today.   Return to the clinic or go to the nearest emergency room if any of your symptoms worsen or new symptoms occur.  Hypertension Hypertension, commonly called high Davis pressure, is when the force of Davis pumping through the arteries is too strong. The arteries are the Davis vessels that carry Davis from the heart throughout the body. Hypertension forces the heart to work harder to pump Davis and may cause arteries to become narrow or stiff. Having untreated or uncontrolled hypertension can cause heart attacks, strokes, kidney disease, and other problems. A Davis pressure reading consists of a higher number over a lower number. Ideally,  your Davis pressure should be below 120/80. The first ("top") number is called the systolic pressure. It is a measure of the pressure in your arteries as your heart beats. The second ("bottom") number is called the diastolic pressure. It is a measure of the pressure in your  arteries as the heart relaxes. What are the causes? The cause of this condition is not known. What increases the risk? Some risk factors for high Davis pressure are under your control. Others are not. Factors you can change  Smoking.  Having type 2 diabetes mellitus, high cholesterol, or both.  Not getting enough exercise or physical activity.  Being overweight.  Having too much fat, sugar, calories, or salt (sodium) in your diet.  Drinking too much alcohol. Factors that are difficult or impossible to change  Having chronic kidney disease.  Having a family history of high Davis pressure.  Age. Risk increases with age.  Race. You may be at higher risk if you are African-American.  Gender. Men are at higher risk than women before age 35. After age 73, women are at higher risk than men.  Having obstructive sleep apnea.  Stress. What are the signs or symptoms? Extremely high Davis pressure (hypertensive crisis) may cause:  Headache.  Anxiety.  Shortness of breath.  Nosebleed.  Nausea and vomiting.  Severe chest pain.  Jerky movements you cannot control (seizures).  How is this diagnosed? This condition is diagnosed by measuring your Davis pressure while you are seated, with your arm resting on a surface. The cuff of the Davis pressure monitor will be placed directly against the skin of your upper arm at the level of your heart. It should be measured at least twice using the same arm. Certain conditions can cause a difference in Davis pressure between your right and left arms. Certain factors can cause Davis pressure readings to be lower or higher than normal (elevated) for a short period of time:  When your Davis pressure is higher when you are in a health care provider's office than when you are at home, this is called white coat hypertension. Most people with this condition do not need medicines.  When your Davis pressure is higher at home than when you are in a  health care provider's office, this is called masked hypertension. Most people with this condition may need medicines to control Davis pressure.  If you have a high Davis pressure reading during one visit or you have normal Davis pressure with other risk factors:  You may be asked to return on a different day to have your Davis pressure checked again.  You may be asked to monitor your Davis pressure at home for 1 week or longer.  If you are diagnosed with hypertension, you may have other Davis or imaging tests to help your health care provider understand your overall risk for other conditions. How is this treated? This condition is treated by making healthy lifestyle changes, such as eating healthy foods, exercising more, and reducing your alcohol intake. Your health care provider may prescribe medicine if lifestyle changes are not enough to get your Davis pressure under control, and if:  Your systolic Davis pressure is above 130.  Your diastolic Davis pressure is above 80.  Your personal target Davis pressure may vary depending on your medical conditions, your age, and other factors. Follow these instructions at home: Eating and drinking  Eat a diet that is high in fiber and potassium, and low in sodium, added sugar, and fat.  An example eating plan is called the DASH (Dietary Approaches to Stop Hypertension) diet. To eat this way: ? Eat plenty of fresh fruits and vegetables. Try to fill half of your plate at each meal with fruits and vegetables. ? Eat whole grains, such as whole wheat pasta, brown rice, or whole grain bread. Fill about one quarter of your plate with whole grains. ? Eat or drink low-fat dairy products, such as skim milk or low-fat yogurt. ? Avoid fatty cuts of meat, processed or cured meats, and poultry with skin. Fill about one quarter of your plate with lean proteins, such as fish, chicken without skin, beans, eggs, and tofu. ? Avoid premade and processed foods. These tend  to be higher in sodium, added sugar, and fat.  Reduce your daily sodium intake. Most people with hypertension should eat less than 1,500 mg of sodium a day.  Limit alcohol intake to no more than 1 drink a day for nonpregnant women and 2 drinks a day for men. One drink equals 12 oz of beer, 5 oz of wine, or 1 oz of hard liquor. Lifestyle  Work with your health care provider to maintain a healthy body weight or to lose weight. Ask what an ideal weight is for you.  Get at least 30 minutes of exercise that causes your heart to beat faster (aerobic exercise) most days of the week. Activities may include walking, swimming, or biking.  Include exercise to strengthen your muscles (resistance exercise), such as pilates or lifting weights, as part of your weekly exercise routine. Try to do these types of exercises for 30 minutes at least 3 days a week.  Do not use any products that contain nicotine or tobacco, such as cigarettes and e-cigarettes. If you need help quitting, ask your health care provider.  Monitor your Davis pressure at home as told by your health care provider.  Keep all follow-up visits as told by your health care provider. This is important. Medicines  Take over-the-counter and prescription medicines only as told by your health care provider. Follow directions carefully. Davis pressure medicines must be taken as prescribed.  Do not skip doses of Davis pressure medicine. Doing this puts you at risk for problems and can make the medicine less effective.  Ask your health care provider about side effects or reactions to medicines that you should watch for. Contact a health care provider if:  You think you are having a reaction to a medicine you are taking.  You have headaches that keep coming back (recurring).  You feel dizzy.  You have swelling in your ankles.  You have trouble with your vision. Get help right away if:  You develop a severe headache or confusion.  You  have unusual weakness or numbness.  You feel faint.  You have severe pain in your chest or abdomen.  You vomit repeatedly.  You have trouble breathing. Summary  Hypertension is when the force of Davis pumping through your arteries is too strong. If this condition is not controlled, it may put you at risk for serious complications.  Your personal target Davis pressure may vary depending on your medical conditions, your age, and other factors. For most people, a normal Davis pressure is less than 120/80.  Hypertension is treated with lifestyle changes, medicines, or a combination of both. Lifestyle changes include weight loss, eating a healthy, low-sodium diet, exercising more, and limiting alcohol. This information is not intended to replace advice given to you by your health care  provider. Make sure you discuss any questions you have with your health care provider. Document Released: 07/19/2005 Document Revised: 06/16/2016 Document Reviewed: 06/16/2016 Elsevier Interactive Patient Education  [employer].   If you have lab work done today you will be contacted with your lab results within the next 2 weeks.  If you have not heard from [place] then please contact us. The fastest way to get your results is to register for My Chart.   IF you received an x-Davis today, you will receive an invoice from [facility]. Please contact [facility] at [phone] with questions or concerns regarding your invoice.   IF you received labwork today, you will receive an invoice from [facility]. Please contact [facility] at [phone] with questions or concerns regarding your invoice.   Our billing staff will not be able to assist you with questions regarding bills from these companies.  You will be contacted with the lab results as soon as they are available. The fastest way to get your results is to activate your My Chart account. Instructions are located on the last page of this  paperwork. If you have not heard from [place] regarding the results in 2 weeks, please contact this office.       I personally performed the services described in this documentation, which was scribed in my presence. The recorded information has been reviewed and considered for accuracy and completeness, addended by me as needed, and agree with information above.  Signed,   Daniel Ray, MD [facility].  06/27/18 9:48 PM

## 2018-06-27 ENCOUNTER — Encounter: Payer: Self-pay | Admitting: Family Medicine

## 2018-06-27 LAB — COMPREHENSIVE METABOLIC PANEL
A/G RATIO: 1.7 (ref 1.2–2.2)
ALBUMIN: 4.3 g/dL (ref 3.6–4.8)
ALT: 22 IU/L (ref 0–44)
AST: 28 IU/L (ref 0–40)
Alkaline Phosphatase: 67 IU/L (ref 39–117)
BUN/Creatinine Ratio: 12 (ref 10–24)
BUN: 11 mg/dL (ref 8–27)
Bilirubin Total: 0.4 mg/dL (ref 0.0–1.2)
CALCIUM: 9.2 mg/dL (ref 8.6–10.2)
CO2: 15 mmol/L — AB (ref 20–29)
Chloride: 106 mmol/L (ref 96–106)
Creatinine, Ser: 0.91 mg/dL (ref 0.76–1.27)
GFR, EST AFRICAN AMERICAN: 106 mL/min/{1.73_m2} (ref >59)
GFR, EST NON AFRICAN AMERICAN: 91 mL/min/{1.73_m2} (ref >59)
GLOBULIN, TOTAL: 2.5 g/dL (ref 1.5–4.5)
Glucose: 97 mg/dL (ref 65–99)
POTASSIUM: 3.8 mmol/L (ref 3.5–5.2)
SODIUM: 141 mmol/L (ref 134–144)
TOTAL PROTEIN: 6.8 g/dL (ref 6.0–8.5)

## 2018-06-27 LAB — LIPID PANEL
Chol/HDL Ratio: 3 ratio (ref 0.0–5.0)
Cholesterol, Total: 134 mg/dL (ref 100–199)
HDL: 44 mg/dL (ref >39)
LDL Calculated: 53 mg/dL (ref 0–99)
TRIGLYCERIDES: 185 mg/dL — AB (ref 0–149)
VLDL Cholesterol Cal: 37 mg/dL (ref 5–40)

## 2018-06-27 LAB — PSA: Prostate Specific Ag, Serum: 3.6 ng/mL (ref 0.0–4.0)

## 2018-07-02 ENCOUNTER — Other Ambulatory Visit: Payer: Self-pay | Admitting: Family Medicine

## 2018-07-02 DIAGNOSIS — M109 Gout, unspecified: Secondary | ICD-10-CM

## 2018-07-03 NOTE — Telephone Encounter (Signed)
[place] called and spoke to [place], [place]. She confirmed the requested Allopurinol was received on 06/26/18 #90/2 refills. Request refused.

## 2018-07-14 ENCOUNTER — Encounter: Payer: Self-pay | Admitting: Family Medicine

## 2018-08-21 DIAGNOSIS — R972 Elevated prostate specific antigen [PSA]: Secondary | ICD-10-CM | POA: Diagnosis not present

## 2018-09-07 ENCOUNTER — Telehealth: Payer: Self-pay | Admitting: Family Medicine

## 2018-09-07 NOTE — Telephone Encounter (Signed)
Left VM in regards to his appt with Dr. Carlota Raspberry on 11/24/2018. The provider will be out of the office and would need to be rescheduled. Also when patient calls back, ask about if he has new insurance, etc. He's scheduled for a CPE on 11/24/2018 but his last CPE was 12/20/17.

## 2018-09-08 NOTE — Telephone Encounter (Signed)
Spoke with patient, going to call back to reschedule his CPE. NEEDS TO BE AFTER 12/21/2018

## 2018-11-24 ENCOUNTER — Encounter: Admitting: Family Medicine

## 2018-11-29 DIAGNOSIS — R972 Elevated prostate specific antigen [PSA]: Secondary | ICD-10-CM | POA: Diagnosis not present

## 2018-12-06 ENCOUNTER — Encounter: Admitting: Family Medicine

## 2018-12-22 ENCOUNTER — Other Ambulatory Visit: Payer: Self-pay

## 2018-12-22 ENCOUNTER — Ambulatory Visit (INDEPENDENT_AMBULATORY_CARE_PROVIDER_SITE_OTHER): Payer: 59 | Admitting: Family Medicine

## 2018-12-22 ENCOUNTER — Encounter: Payer: Self-pay | Admitting: Family Medicine

## 2018-12-22 VITALS — BP 160/92 | HR 94 | Temp 99.3°F | Resp 20 | Ht 71.02 in | Wt 206.6 lb

## 2018-12-22 DIAGNOSIS — I10 Essential (primary) hypertension: Secondary | ICD-10-CM | POA: Diagnosis not present

## 2018-12-22 DIAGNOSIS — Z23 Encounter for immunization: Secondary | ICD-10-CM

## 2018-12-22 DIAGNOSIS — Z13228 Encounter for screening for other metabolic disorders: Secondary | ICD-10-CM

## 2018-12-22 DIAGNOSIS — E785 Hyperlipidemia, unspecified: Secondary | ICD-10-CM

## 2018-12-22 DIAGNOSIS — Z0000 Encounter for general adult medical examination without abnormal findings: Secondary | ICD-10-CM

## 2018-12-22 DIAGNOSIS — M25511 Pain in right shoulder: Secondary | ICD-10-CM

## 2018-12-22 DIAGNOSIS — R05 Cough: Secondary | ICD-10-CM

## 2018-12-22 DIAGNOSIS — Z13220 Encounter for screening for lipoid disorders: Secondary | ICD-10-CM

## 2018-12-22 DIAGNOSIS — R059 Cough, unspecified: Secondary | ICD-10-CM

## 2018-12-22 DIAGNOSIS — Z0001 Encounter for general adult medical examination with abnormal findings: Secondary | ICD-10-CM

## 2018-12-22 DIAGNOSIS — M109 Gout, unspecified: Secondary | ICD-10-CM | POA: Diagnosis not present

## 2018-12-22 DIAGNOSIS — Z1329 Encounter for screening for other suspected endocrine disorder: Secondary | ICD-10-CM

## 2018-12-22 DIAGNOSIS — Z130 Encounter for screening for diseases of the blood and blood-forming organs and certain disorders involving the immune mechanism: Secondary | ICD-10-CM

## 2018-12-22 MED ORDER — ALLOPURINOL 100 MG PO TABS: 100.0000 mg | ORAL_TABLET | Freq: Every day | ORAL | 2 refills | Status: DC

## 2018-12-22 MED ORDER — ATORVASTATIN CALCIUM 20 MG PO TABS: ORAL_TABLET | ORAL | 2 refills | Status: DC

## 2018-12-22 MED ORDER — LISINOPRIL 20 MG PO TABS: 20.0000 mg | ORAL_TABLET | Freq: Every day | ORAL | 2 refills | Status: DC
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
## 2018-12-22 NOTE — Patient Instructions (Addendum)
Keep a record of your blood pressures outside of the office and schedule follow up appointment if over 140/90.   Check temperature twice per day for the next few days, avoid contact with others if any continued cough this weekend.  If cough is not resolved into next week, schedule follow-up appointment.  If any worsening symptoms such as fevers, shortness of breath, body aches, headache, change in taste or smell, be evaluated sooner.     Keeping you healthy  Get these tests  Blood pressure- Have your blood pressure checked once a year by your healthcare provider.  Normal blood pressure is 120/80  Weight- Have your body mass index (BMI) calculated to screen for obesity.  BMI is a measure of body fat based on height and weight. You can also calculate your own BMI at ViewBanking.[email or web address].  Cholesterol- Have your cholesterol checked every year.  Diabetes- Have your blood sugar checked regularly if you have high blood pressure, high cholesterol, have a family history of diabetes or if you are overweight.  Screening for Colon Cancer- Colonoscopy starting at age 37.  Screening may begin sooner depending on your family history and other health conditions. Follow up colonoscopy as directed by your Gastroenterologist.  Screening for Prostate Cancer- Both blood work (PSA) and a rectal exam help screen for Prostate Cancer.  Screening begins at age 46 with African-American men and at age 28 with Caucasian men.  Screening may begin sooner depending on your family history.  Take these medicines  Aspirin- One aspirin daily can help prevent Heart disease and Stroke.  Flu shot- Every fall.  Tetanus- Every 10 years.  Zostavax- Once after the age of 61 to prevent Shingles.  Pneumonia shot- Once after the age of 41; if you are younger than 25, ask your healthcare provider if you need a Pneumonia shot.  Take these steps  Don't smoke- If you do smoke, talk to your doctor about quitting.  For  tips on how to quit, go to www.smokefree.gov or call 1-800-QUIT-NOW.  Be physically active- Exercise 5 days a week for at least 30 minutes.  If you are not already physically active start slow and gradually work up to 30 minutes of moderate physical activity.  Examples of moderate activity include walking briskly, mowing the yard, dancing, swimming, bicycling, etc.  Eat a healthy diet- Eat a variety of healthy food such as fruits, vegetables, low fat milk, low fat cheese, yogurt, lean meant, poultry, fish, beans, tofu, etc. For more information go to www.thenutritionsource.org  Drink alcohol in moderation- Limit alcohol intake to less than two drinks a day. Never drink and drive.  Dentist- Brush and floss twice daily; visit your dentist twice a year.  Depression- Your emotional health is as important as your physical health. If you're feeling down, or losing interest in things you would normally enjoy please talk to your healthcare provider.  Eye exam- Visit your eye doctor every year.  Safe sex- If you may be exposed to a sexually transmitted infection, use a condom.  Seat belts- Seat belts can save your life; always wear one.  Smoke/Carbon Monoxide detectors- These detectors need to be installed on the appropriate level of your home.  Replace batteries at least once a year.  Skin cancer- When out in the sun, cover up and use sunscreen 15 SPF or higher.  Violence- If anyone is threatening you, please tell your healthcare provider.  Living Will/ Health care power of attorney- Speak with your healthcare provider and  family.  If you have lab work done today you will be contacted with your lab results within the next 2 weeks.  If you have not heard from [place] then please contact us. The fastest way to get your results is to register for My Chart.   IF you received an x-ray today, you will receive an invoice from [facility]. Please contact [facility] at [phone] with  questions or concerns regarding your invoice.   IF you received labwork today, you will receive an invoice from [facility]. Please contact [facility] at [phone] with questions or concerns regarding your invoice.   Our billing staff will not be able to assist you with questions regarding bills from these companies.  You will be contacted with the lab results as soon as they are available. The fastest way to get your results is to activate your My Chart account. Instructions are located on the last page of this paperwork. If you have not heard from [place] regarding the results in 2 weeks, please contact this office.

## 2018-12-23 LAB — CMP14+EGFR
ALT: 26 IU/L (ref 0–44)
AST: 46 IU/L — ABNORMAL HIGH (ref 0–40)
Albumin/Globulin Ratio: 1.8 (ref 1.2–2.2)
Albumin: 4.1 g/dL (ref 3.8–4.8)
Alkaline Phosphatase: 59 IU/L (ref 39–117)
BUN/Creatinine Ratio: 14 (ref 10–24)
BUN: 12 mg/dL (ref 8–27)
Bilirubin Total: 0.5 mg/dL (ref 0.0–1.2)
CO2: 21 mmol/L (ref 20–29)
Calcium: 9.5 mg/dL (ref 8.6–10.2)
Chloride: 97 mmol/L (ref 96–106)
Creatinine, Ser: 0.83 mg/dL (ref 0.76–1.27)
GFR calc Af Amer: 110 mL/min/{1.73_m2} (ref >59)
GFR calc non Af Amer: 95 mL/min/{1.73_m2} (ref >59)
Globulin, Total: 2.3 g/dL (ref 1.5–4.5)
Glucose: 113 mg/dL — ABNORMAL HIGH (ref 65–99)
Potassium: 4.6 mmol/L (ref 3.5–5.2)
Sodium: 133 mmol/L — ABNORMAL LOW (ref 134–144)
Total Protein: 6.4 g/dL (ref 6.0–8.5)

## 2018-12-23 LAB — CBC WITH DIFFERENTIAL/PLATELET
Basophils Absolute: 0 10*3/uL (ref 0.0–0.2)
Basos: 1 %
EOS (ABSOLUTE): 0 10*3/uL (ref 0.0–0.4)
Eos: 0 %
Hematocrit: 44.7 % (ref 37.5–51.0)
Hemoglobin: 14.8 g/dL (ref 13.0–17.7)
Immature Grans (Abs): 0 10*3/uL (ref 0.0–0.1)
Immature Granulocytes: 0 %
Lymphocytes Absolute: 1.9 10*3/uL (ref 0.7–3.1)
Lymphs: 34 %
MCH: 28.4 pg (ref 26.6–33.0)
MCHC: 33.1 g/dL (ref 31.5–35.7)
MCV: 86 fL (ref 79–97)
Monocytes Absolute: 0.6 10*3/uL (ref 0.1–0.9)
Monocytes: 11 %
Neutrophils Absolute: 3.1 10*3/uL (ref 1.4–7.0)
Neutrophils: 54 %
Platelets: 287 10*3/uL (ref 150–450)
RBC: 5.21 x10E6/uL (ref 4.14–5.80)
RDW: 15.4 % (ref 11.6–15.4)
WBC: 5.8 10*3/uL (ref 3.4–10.8)

## 2018-12-23 LAB — LIPID PANEL
Chol/HDL Ratio: 2.8 ratio (ref 0.0–5.0)
Cholesterol, Total: 131 mg/dL (ref 100–199)
HDL: 47 mg/dL (ref >39)
LDL Calculated: 18 mg/dL (ref 0–99)
Triglycerides: 331 mg/dL — ABNORMAL HIGH (ref 0–149)
VLDL Cholesterol Cal: 66 mg/dL — ABNORMAL HIGH (ref 5–40)

## 2018-12-23 LAB — URIC ACID: Uric Acid: 4.2 mg/dL (ref 3.7–8.6)

## 2018-12-23 LAB — TSH: TSH: 0.912 u[IU]/mL (ref 0.450–4.500)

## 2018-12-29 ENCOUNTER — Telehealth (INDEPENDENT_AMBULATORY_CARE_PROVIDER_SITE_OTHER): Payer: 59 | Admitting: Family Medicine

## 2018-12-29 ENCOUNTER — Encounter: Payer: Self-pay | Admitting: Family Medicine

## 2018-12-29 ENCOUNTER — Other Ambulatory Visit: Payer: Self-pay

## 2018-12-29 VITALS — Ht 71.0 in | Wt 200.0 lb

## 2018-12-29 DIAGNOSIS — M25511 Pain in right shoulder: Secondary | ICD-10-CM | POA: Diagnosis not present

## 2018-12-29 DIAGNOSIS — I10 Essential (primary) hypertension: Secondary | ICD-10-CM

## 2018-12-29 NOTE — Patient Instructions (Signed)
If shoulder range of motion or strength is not returning to normal in the next 2 weeks, I do recommend xray and possible orthopaedic evaluation.   If blood pressure remains over 140/90, will need to adjust meds - let me know.
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
## 2019-09-10 ENCOUNTER — Encounter: Payer: Self-pay | Admitting: Family Medicine

## 2019-09-10 ENCOUNTER — Other Ambulatory Visit: Payer: Self-pay

## 2019-09-10 ENCOUNTER — Ambulatory Visit (INDEPENDENT_AMBULATORY_CARE_PROVIDER_SITE_OTHER): Payer: 59 | Admitting: Family Medicine

## 2019-09-10 VITALS — BP 150/89 | HR 76 | Temp 97.8°F | Ht 71.0 in | Wt 202.0 lb

## 2019-09-10 DIAGNOSIS — I10 Essential (primary) hypertension: Secondary | ICD-10-CM

## 2019-09-10 DIAGNOSIS — Z125 Encounter for screening for malignant neoplasm of prostate: Secondary | ICD-10-CM

## 2019-09-10 DIAGNOSIS — Z0001 Encounter for general adult medical examination with abnormal findings: Secondary | ICD-10-CM

## 2019-09-10 DIAGNOSIS — Z0000 Encounter for general adult medical examination without abnormal findings: Secondary | ICD-10-CM

## 2019-09-10 DIAGNOSIS — E785 Hyperlipidemia, unspecified: Secondary | ICD-10-CM | POA: Diagnosis not present

## 2019-09-10 DIAGNOSIS — M109 Gout, unspecified: Secondary | ICD-10-CM

## 2019-09-10 DIAGNOSIS — R972 Elevated prostate specific antigen [PSA]: Secondary | ICD-10-CM

## 2019-09-10 MED ORDER — ATORVASTATIN CALCIUM 20 MG PO TABS: ORAL_TABLET | ORAL | 2 refills | Status: DC

## 2019-09-10 MED ORDER — INDOMETHACIN 50 MG PO CAPS: 50.0000 mg | ORAL_CAPSULE | Freq: Three times a day (TID) | ORAL | 0 refills | Status: DC | PRN

## 2019-09-10 MED ORDER — LISINOPRIL 20 MG PO TABS: 20.0000 mg | ORAL_TABLET | Freq: Every day | ORAL | 2 refills | Status: DC

## 2019-09-10 MED ORDER — ALLOPURINOL 100 MG PO TABS: 100.0000 mg | ORAL_TABLET | Freq: Every day | ORAL | 2 refills | Status: DC

## 2019-09-10 MED ORDER — AMLODIPINE BESYLATE 5 MG PO TABS: 5.0000 mg | ORAL_TABLET | Freq: Every day | ORAL | 1 refills | Status: DC

## 2019-09-10 NOTE — Patient Instructions (Addendum)
   Colonoscopy due later this year.  They should call you, but if not please call to have that scheduled if you have not heard from them.   Schedule eye doctor visit.   Add amlodipine once per day for blood pressure. Recheck in 6 weeks.    COVID-19 Vaccine Information can be found at: ShippingScam.co.uk For questions related to vaccine distribution or appointments, please email [email or web address] or call [phone].      If you have lab work done today you will be contacted with your lab results within the next 2 weeks.  If you have not heard from [place] then please contact us. The fastest way to get your results is to register for My Chart.   IF you received an x-ray today, you will receive an invoice from [facility]. Please contact [facility] at [phone] with questions or concerns regarding your invoice.   IF you received labwork today, you will receive an invoice from [facility]. Please contact [facility] at [phone] with questions or concerns regarding your invoice.   Our billing staff will not be able to assist you with questions regarding bills from these companies.  You will be contacted with the lab results as soon as they are available. The fastest way to get your results is to activate your My Chart account. Instructions are located on the last page of this paperwork. If you have not heard from [place] regarding the results in 2 weeks, please contact this office.
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
## 2019-09-11 LAB — COMPREHENSIVE METABOLIC PANEL
ALT: 16 IU/L (ref 0–44)
AST: 22 IU/L (ref 0–40)
Albumin/Globulin Ratio: 1.9 (ref 1.2–2.2)
Albumin: 4.6 g/dL (ref 3.8–4.8)
Alkaline Phosphatase: 53 IU/L (ref 39–117)
BUN/Creatinine Ratio: 11 (ref 10–24)
BUN: 10 mg/dL (ref 8–27)
Bilirubin Total: 0.6 mg/dL (ref 0.0–1.2)
CO2: 22 mmol/L (ref 20–29)
Calcium: 9.7 mg/dL (ref 8.6–10.2)
Chloride: 101 mmol/L (ref 96–106)
Creatinine, Ser: 0.95 mg/dL (ref 0.76–1.27)
GFR calc Af Amer: 99 mL/min/{1.73_m2} (ref >59)
GFR calc non Af Amer: 86 mL/min/{1.73_m2} (ref >59)
Globulin, Total: 2.4 g/dL (ref 1.5–4.5)
Glucose: 95 mg/dL (ref 65–99)
Potassium: 4.5 mmol/L (ref 3.5–5.2)
Sodium: 136 mmol/L (ref 134–144)
Total Protein: 7 g/dL (ref 6.0–8.5)

## 2019-09-11 LAB — LIPID PANEL
Chol/HDL Ratio: 2.5 ratio (ref 0.0–5.0)
Cholesterol, Total: 143 mg/dL (ref 100–199)
HDL: 57 mg/dL (ref >39)
LDL Chol Calc (NIH): 60 mg/dL (ref 0–99)
Triglycerides: 151 mg/dL — ABNORMAL HIGH (ref 0–149)
VLDL Cholesterol Cal: 26 mg/dL (ref 5–40)

## 2019-09-11 LAB — URIC ACID: Uric Acid: 4.5 mg/dL (ref 3.8–8.4)

## 2019-09-11 LAB — PSA: Prostate Specific Ag, Serum: 5.1 ng/mL — ABNORMAL HIGH (ref 0.0–4.0)

## 2019-10-05 ENCOUNTER — Other Ambulatory Visit: Payer: Self-pay | Admitting: Family Medicine

## 2019-10-05 DIAGNOSIS — M109 Gout, unspecified: Secondary | ICD-10-CM

## 2019-10-18 ENCOUNTER — Ambulatory Visit: Payer: 59 | Attending: Family

## 2019-10-18 DIAGNOSIS — Z23 Encounter for immunization: Secondary | ICD-10-CM

## 2019-10-18 NOTE — Progress Notes (Signed)
   [id number]  Name:  Kiran Rawles.    MRN: [id number] DOB: [birth year]  10/18/2019  Mr. Guglielmo was observed post Covid-19 immunization for 15 minutes without incident. He was provided with Vaccine Information Sheet and instruction to access the V-Safe system.   Mr. Feurtado was instructed to call 911 with any severe reactions post vaccine: Marland Kitchen Difficulty breathing  . Swelling of face and throat  . A fast heartbeat  . A bad rash all over body  . Dizziness and weakness   Immunizations Administered    Name Date Dose VIS Date Route   Moderna COVID-19 Vaccine 10/18/2019 11:25 AM 0.5 mL 07/03/2019 Intramuscular   Manufacturer: Moderna   Lot: [id number]   [place]uddenVO:[id number]

## 2019-10-22 ENCOUNTER — Ambulatory Visit (INDEPENDENT_AMBULATORY_CARE_PROVIDER_SITE_OTHER): Payer: 59 | Admitting: Family Medicine

## 2019-10-22 ENCOUNTER — Encounter: Payer: Self-pay | Admitting: Family Medicine

## 2019-10-22 ENCOUNTER — Other Ambulatory Visit: Payer: Self-pay

## 2019-10-22 VITALS — BP 118/76 | HR 90 | Temp 97.8°F | Ht 71.0 in | Wt 204.0 lb

## 2019-10-22 DIAGNOSIS — I10 Essential (primary) hypertension: Secondary | ICD-10-CM

## 2019-10-22 MED ORDER — AMLODIPINE BESYLATE 5 MG PO TABS: 5.0000 mg | ORAL_TABLET | Freq: Every day | ORAL | 0 refills | Status: DC

## 2019-10-22 NOTE — Patient Instructions (Addendum)
  Blood pressure looks good today.  If you continue to have lower readings at home or any lightheadedness or dizziness on the new regimen, can split the amlodipine in half or stop it with close monitoring of your blood pressure.  Continue lisinopril same dose for now.  Recheck in 5 months, let me know if there are questions and take care.   If you have lab work done today you will be contacted with your lab results within the next 2 weeks.  If you have not heard from [place] then please contact us. The fastest way to get your results is to register for My Chart.   IF you received an x-ray today, you will receive an invoice from [facility]. Please contact [facility] at [phone] with questions or concerns regarding your invoice.   IF you received labwork today, you will receive an invoice from [facility]. Please contact [facility] at [phone] with questions or concerns regarding your invoice.   Our billing staff will not be able to assist you with questions regarding bills from these companies.  You will be contacted with the lab results as soon as they are available. The fastest way to get your results is to activate your My Chart account. Instructions are located on the last page of this paperwork. If you have not heard from [place] regarding the results in 2 weeks, please contact this office.

## 2019-10-22 NOTE — Progress Notes (Signed)
Subjective:  Patient ID: Daniel Davis., male    DOB: 06-20-1956  Age: 64 y.o. MRN: [id number]  CC:  Chief Complaint  Patient presents with  . Hypertension    6 week follow-up. pt hasn't had any issues at home with BP since last visit. pt dosen't express any physical symptoms of hypertension.    HPI Daniel Davis. presents for   Hypertension: Decreased control at 09/10/19 physical. Added amlodipine 5mg  qd. Continued lisinopril 20mg . No new side effects. Intermittently started d/t concern of side effects. Tolerating every day.  Walking daily. Feeling great.  S/p 1st covid vaccine. Doing well.  Wt Readings from Last 3 Encounters:  10/22/19 204 lb (92.5 kg)  09/10/19 202 lb (91.6 kg)  12/29/18 200 lb (90.7 kg)   Home readings: none.  BP Readings from Last 3 Encounters:  10/22/19 118/76  09/10/19 (!) 150/89  12/22/18 (!) 160/92   Lab Results  Component Value Date   CREATININE 0.95 09/10/2019    Other labs from last visit reviewed. No changes.   Has been seen by urology. Plan for repeat testing.  History Patient Active Problem List   Diagnosis Date Noted  . Nonspecific abnormal finding in stool contents 02/28/2015  . Family history of cancer 02/28/2015  . OA (osteoarthritis) 02/28/2012  . HTN (hypertension) 11/27/2011  . Lipid disorder 11/27/2011  . Gout attack 11/27/2011   Past Medical History:  Diagnosis Date  . Arthritis   . Colon polyps   . Gout attack   . Hyperlipidemia   . Hypertension    Past Surgical History:  Procedure Laterality Date  . FRACTURE SURGERY  2000   LEFT ankle, ORIF   Allergies  Allergen Reactions  . Gadolinium Derivatives Hives    11/18/17 developed 4-5 hives on neck and face; no other complaints.  Treated with Benadryl 25mg  PO (pt given 2 more caps to take with him for 3.5 mile drive home).  Needs Benadryl 50mg  PO pre-med prior to next MRI w/ gad, per Dr. Nelson Chimes.   Prior to Admission medications   Medication Sig Start Date  End Date Taking? Authorizing Provider  allopurinol (ZYLOPRIM) 100 MG tablet Take 1 tablet (100 mg total) by mouth daily. 09/10/19  Yes [name], MD  amLODipine (NORVASC) 5 MG tablet Take 1 tablet (5 mg total) by mouth daily. 09/10/19  Yes [name], MD  aspirin 81 MG tablet Take 81 mg by mouth daily.   Yes [provider]  atorvastatin (LIPITOR) 20 MG tablet take 1 tablet by mouth at bedtime 09/10/19  Yes [name], MD  ferrous sulfate 325 (65 FE) MG tablet Take 325 mg by mouth daily with breakfast.   Yes [provider]  indomethacin (INDOCIN) 50 MG capsule Take 1 capsule (50 mg total) by mouth 3 (three) times daily as needed. for MILD PAIN(GOUT) 09/10/19  Yes [name], MD  lisinopril (ZESTRIL) 20 MG tablet Take 1 tablet (20 mg total) by mouth daily. 09/10/19  Yes [name], MD   Social History   Socioeconomic History  . Marital status: Divorced    Spouse name: [name]  . Number of children: 1  . Years of education: 14+  . Highest education level: Not on file  Occupational History  . Occupation: SHIFT SUPERVISOR    Employer: [employer]  Tobacco Use  . Smoking status: Never Smoker  . Smokeless tobacco: Never Used  Substance and Sexual Activity  .  Alcohol use: Yes    Alcohol/week: 3.0 standard drinks    Types: 3 Standard drinks or equivalent per week    Comment: 3-5 drinks  . Drug use: No  . Sexual activity: Yes    Partners: Female  Other Topics Concern  . Not on file  Social History Narrative   Lives with his wife and their daughter.   Exercise: Walking   Education: some Radio producer Determinants of Health   Financial Resource Strain:   . Difficulty of Paying Living Expenses:   Food Insecurity:   . Worried About Charity fundraiser in the Last Year:   . Arboriculturist in the Last Year:   Transportation Needs:   . Film/video editor (Medical):   Marland Kitchen Lack of Transportation (Non-Medical):   Physical Activity:     . Days of Exercise per Week:   . Minutes of Exercise per Session:   Stress:   . Feeling of Stress :   Social Connections:   . Frequency of Communication with Friends and Family:   . Frequency of Social Gatherings with Friends and Family:   . Attends Religious Services:   . Active Member of Clubs or Organizations:   . Attends Archivist Meetings:   Marland Kitchen Marital Status:   Intimate Partner Violence:   . Fear of Current or Ex-Partner:   . Emotionally Abused:   Marland Kitchen Physically Abused:   . Sexually Abused:     Review of Systems  Constitutional: Negative for fatigue and unexpected weight change.  Eyes: Negative for visual disturbance.  Respiratory: Negative for cough, chest tightness and shortness of breath.   Cardiovascular: Negative for chest pain, palpitations and leg swelling.  Gastrointestinal: Negative for abdominal pain and blood in stool.  Neurological: Negative for dizziness, light-headedness and headaches.     Objective:   Vitals:   10/22/19 0950  BP: 118/76  Pulse: 90  Temp: 97.8 F (36.6 C)  TempSrc: Temporal  SpO2: 98%  Weight: 204 lb (92.5 kg)  Height: 5\' 11"  (1.803 m)     Physical Exam Vitals reviewed.  Constitutional:      Appearance: He is well-developed.  HENT:     Head: Normocephalic and atraumatic.  Eyes:     Pupils: Pupils are equal, round, and reactive to light.  Neck:     Vascular: No carotid bruit or JVD.  Cardiovascular:     Rate and Rhythm: Normal rate and regular rhythm.     Heart sounds: Normal heart sounds. No murmur.  Pulmonary:     Effort: Pulmonary effort is normal.     Breath sounds: Normal breath sounds. No rales.  Skin:    General: Skin is warm and dry.  Neurological:     Mental Status: He is alert and oriented to person, place, and time.        Assessment & Plan:  Daniel Davis. is a 64 y.o. male . Essential hypertension - Plan: amLODipine (NORVASC) 5 MG tablet Improved control, tolerating amlodipine  addition with lisinopril.  Option of lower dosing if any side effects at 5 mg or low readings at home.  Recheck in 5 months.  Continue lisinopril same dose.  RTC precautions   Meds ordered this encounter  Medications  . amLODipine (NORVASC) 5 MG tablet    Sig: Take 1 tablet (5 mg total) by mouth daily.    Dispense:  90 tablet    Refill:  0   Patient Instructions  Blood pressure looks good today.  If you continue to have lower readings at home or any lightheadedness or dizziness on the new regimen, can split the amlodipine in half or stop it with close monitoring of your blood pressure.  Continue lisinopril same dose for now.  Recheck in 5 months, let me know if there are questions and take care.   If you have lab work done today you will be contacted with your lab results within the next 2 weeks.  If you have not heard from [place] then please contact us. The fastest way to get your results is to register for My Chart.   IF you received an x-ray today, you will receive an invoice from [facility]. Please contact [facility] at [phone] with questions or concerns regarding your invoice.   IF you received labwork today, you will receive an invoice from [place]. Please contact [facility] at [phone] with questions or concerns regarding your invoice.   Our billing staff will not be able to assist you with questions regarding bills from these companies.  You will be contacted with the lab results as soon as they are available. The fastest way to get your results is to activate your My Chart account. Instructions are located on the last page of this paperwork. If you have not heard from [place] regarding the results in 2 weeks, please contact this office.         Signed, Merri Ray, MD [facility]

## 2019-10-23 ENCOUNTER — Other Ambulatory Visit: Payer: Self-pay | Admitting: Family Medicine

## 2019-10-23 DIAGNOSIS — M109 Gout, unspecified: Secondary | ICD-10-CM

## 2019-10-23 MED ORDER — INDOMETHACIN 50 MG PO CAPS: 50.0000 mg | ORAL_CAPSULE | Freq: Three times a day (TID) | ORAL | 1 refills | Status: DC | PRN

## 2019-10-23 NOTE — Telephone Encounter (Signed)
Medication Refill - Medication: indomethacin (INDOCIN) 50 MG capsule    Has the patient contacted their pharmacy? Yes.   (Agent: If no, request that the patient contact the pharmacy for the refill.) (Agent: If yes, when and what did the pharmacy advise?)  Preferred Pharmacy (with phone number or street name):  [id number] - [place], [place] AT [address] Phone:  [phone]  Fax:  [phone]       Agent: Please be advised that RX refills may take up to 3 business days. We ask that you follow-up with your pharmacy.

## 2019-11-05 ENCOUNTER — Other Ambulatory Visit: Payer: Self-pay | Admitting: Family Medicine

## 2019-11-05 DIAGNOSIS — M109 Gout, unspecified: Secondary | ICD-10-CM

## 2019-11-08 ENCOUNTER — Other Ambulatory Visit: Payer: Self-pay

## 2019-11-08 ENCOUNTER — Telehealth: Payer: Self-pay | Admitting: Family Medicine

## 2019-11-08 DIAGNOSIS — M109 Gout, unspecified: Secondary | ICD-10-CM

## 2019-11-08 MED ORDER — ALLOPURINOL 100 MG PO TABS: 100.0000 mg | ORAL_TABLET | Freq: Every day | ORAL | 2 refills | Status: DC

## 2019-11-08 NOTE — Telephone Encounter (Signed)
Pt called and is requesting to have this filled because he is completely out of medication. Please advise.

## 2019-11-20 ENCOUNTER — Ambulatory Visit: Payer: Self-pay | Attending: Family

## 2019-11-20 DIAGNOSIS — Z23 Encounter for immunization: Secondary | ICD-10-CM

## 2019-11-20 NOTE — Progress Notes (Signed)
   [id number]  Name:  Daniel Davis.    MRN: [id number] DOB: 06-27-1956  11/20/2019  [name] was observed post Covid-19 immunization for 15 minutes without incident. He was provided with Vaccine Information Sheet and instruction to access the V-Safe system.   [name] was instructed to call 911 with any severe reactions post vaccine: Marland Kitchen Difficulty breathing  . Swelling of face and throat  . A fast heartbeat  . A bad rash all over body  . Dizziness and weakness   Immunizations Administered    Name Date Dose VIS Date Route   Moderna COVID-19 Vaccine 11/20/2019 10:00 AM 0.5 mL 07/2019 Intramuscular   Manufacturer: Moderna   Lot: [id number]   [id number]

## 2020-03-26 ENCOUNTER — Other Ambulatory Visit: Payer: Self-pay

## 2020-03-26 ENCOUNTER — Ambulatory Visit (INDEPENDENT_AMBULATORY_CARE_PROVIDER_SITE_OTHER): Admitting: Family Medicine

## 2020-03-26 ENCOUNTER — Encounter: Payer: Self-pay | Admitting: Family Medicine

## 2020-03-26 VITALS — BP 118/76 | HR 92 | Temp 98.0°F | Ht 71.0 in | Wt 205.0 lb

## 2020-03-26 DIAGNOSIS — D1723 Benign lipomatous neoplasm of skin and subcutaneous tissue of right leg: Secondary | ICD-10-CM

## 2020-03-26 DIAGNOSIS — M109 Gout, unspecified: Secondary | ICD-10-CM

## 2020-03-26 DIAGNOSIS — E785 Hyperlipidemia, unspecified: Secondary | ICD-10-CM

## 2020-03-26 DIAGNOSIS — Z1211 Encounter for screening for malignant neoplasm of colon: Secondary | ICD-10-CM | POA: Diagnosis not present

## 2020-03-26 DIAGNOSIS — I10 Essential (primary) hypertension: Secondary | ICD-10-CM | POA: Diagnosis not present

## 2020-03-26 MED ORDER — ALLOPURINOL 100 MG PO TABS: 100.0000 mg | ORAL_TABLET | Freq: Every day | ORAL | 2 refills | Status: DC

## 2020-03-26 MED ORDER — ATORVASTATIN CALCIUM 20 MG PO TABS: ORAL_TABLET | ORAL | 2 refills | Status: DC

## 2020-03-26 MED ORDER — LISINOPRIL 20 MG PO TABS: 20.0000 mg | ORAL_TABLET | Freq: Every day | ORAL | 2 refills | Status: DC

## 2020-03-26 MED ORDER — AMLODIPINE BESYLATE 5 MG PO TABS: 5.0000 mg | ORAL_TABLET | Freq: Every day | ORAL | 0 refills | Status: DC

## 2020-03-26 NOTE — Progress Notes (Signed)
Subjective:  Patient ID: Daniel Loges., male    DOB: Aug 04, 1955  Age: 64 y.o. MRN: [id number]  CC:  Chief Complaint  Patient presents with  . Follow-up    on hypertension. pt reports no issues with this condition since last OV. Pt states he gets his BP checked at the [place] and it has been running in normal range around todays reading of 118/76.pt reports no physical symptoms since the last OV.  . Mass    Pt reports hye has a mass/ nodule on the side of his R thigh. pt reports the mass isn't painful nor dose it cause any discomfort.    HPI Daniel T Tredway Jr. presents for   Hypertension: Amlodipine 5 mg daily, lisinopril 20 mg daily. Home readings: same as here - 118/70 range, and controlled at [facility] when seen there. No new side effects of meds.  BP Readings from Last 3 Encounters:  03/26/20 118/76  10/22/19 118/76  09/10/19 (!) 150/89   Lab Results  Component Value Date   CREATININE 0.95 09/10/2019   Hyperlipidemia: Lipitor 20 mg daily,  still on aspirin 81 mg daily - most days. . No new myalgias. No GI bleeding, or other bleeding.  Lab Results  Component Value Date   CHOL 143 09/10/2019   HDL 57 09/10/2019   LDLCALC 60 09/10/2019   TRIG 151 (H) 09/10/2019   CHOLHDL 2.5 09/10/2019   Lab Results  Component Value Date   ALT 16 09/10/2019   AST 22 09/10/2019   ALKPHOS 53 09/10/2019   BILITOT 0.6 09/10/2019   Gout: Last flare:no recent flare.  Daily meds: Allopurinol 100 mg daily Prn med: Indomethacin 50 mg as needed. Lab Results  Component Value Date   LABURIC 4.5 09/10/2019   Right thigh mass Noticed past few weeks. possible contusion to that area month ago. No skin color change/bruising/bleeding.  No pain.  No change in size.  Lipoma in left arm - plans to discuss removal at [facility].   Health maintenance, due for colonoscopy, previous polyp in 2016, 5-year repeat recommended.  Referral will be placed.  Patient agrees to referral  History Patient Active Problem  List   Diagnosis Date Noted  . Nonspecific abnormal finding in stool contents 02/28/2015  . Family history of cancer 02/28/2015  . OA (osteoarthritis) 02/28/2012  . HTN (hypertension) 11/27/2011  . Lipid disorder 11/27/2011  . Gout attack 11/27/2011   Past Medical History:  Diagnosis Date  . Arthritis   . Colon polyps   . Gout attack   . Hyperlipidemia   . Hypertension    Past Surgical History:  Procedure Laterality Date  . FRACTURE SURGERY  2000   LEFT ankle, ORIF   Allergies  Allergen Reactions  . Gadolinium Derivatives Hives    11/18/17 developed 4-5 hives on neck and face; no other complaints.  Treated with Benadryl 25mg  PO (pt given 2 more caps to take with him for 3.5 mile drive home).  Needs Benadryl 50mg  PO pre-med prior to next MRI w/ gad, per Dr. Nelson Chimes.  Marland Kitchen Methocarbamol Hives   Prior to Admission medications   Medication Sig Start Date End Date Taking? Authorizing Provider  allopurinol (ZYLOPRIM) 100 MG tablet Take 1 tablet (100 mg total) by mouth daily. 11/08/19  Yes [name], MD  amLODipine (NORVASC) 5 MG tablet Take 1 tablet (5 mg total) by mouth daily. 10/22/19  Yes [name], MD  aspirin 81 MG tablet Take 81 mg by  mouth daily.   Yes [provider]  atorvastatin (LIPITOR) 20 MG tablet take 1 tablet by mouth at bedtime 09/10/19  Yes [name], MD  ferrous sulfate 325 (65 FE) MG tablet Take 325 mg by mouth daily with breakfast.   Yes [provider]  indomethacin (INDOCIN) 50 MG capsule Take 1 capsule (50 mg total) by mouth 3 (three) times daily as needed. for MILD PAIN(GOUT) 10/23/19  Yes [name], MD  lisinopril (ZESTRIL) 20 MG tablet Take 1 tablet (20 mg total) by mouth daily. 09/10/19  Yes [name], MD   Social History   Socioeconomic History  . Marital status: Divorced    Spouse name: [name]  . Number of children: 1  . Years of education: 14+  . Highest education level: Not on file  Occupational  History  . Occupation: SHIFT SUPERVISOR    Employer: [employer]  Tobacco Use  . Smoking status: Never Smoker  . Smokeless tobacco: Never Used  Vaping Use  . Vaping Use: Never used  Substance and Sexual Activity  . Alcohol use: Yes    Alcohol/week: 3.0 standard drinks    Types: 3 Standard drinks or equivalent per week    Comment: 3-5 drinks  . Drug use: No  . Sexual activity: Yes    Partners: Female  Other Topics Concern  . Not on file  Social History Narrative   Lives with his wife and their daughter.   Exercise: Walking   Education: some Radio producer Determinants of Health   Financial Resource Strain:   . Difficulty of Paying Living Expenses: Not on file  Food Insecurity:   . Worried About Charity fundraiser in the Last Year: Not on file  . Ran Out of Food in the Last Year: Not on file  Transportation Needs:   . Lack of Transportation (Medical): Not on file  . Lack of Transportation (Non-Medical): Not on file  Physical Activity:   . Days of Exercise per Week: Not on file  . Minutes of Exercise per Session: Not on file  Stress:   . Feeling of Stress : Not on file  Social Connections:   . Frequency of Communication with Friends and Family: Not on file  . Frequency of Social Gatherings with Friends and Family: Not on file  . Attends Religious Services: Not on file  . Active Member of Clubs or Organizations: Not on file  . Attends Archivist Meetings: Not on file  . Marital Status: Not on file  Intimate Partner Violence:   . Fear of Current or Ex-Partner: Not on file  . Emotionally Abused: Not on file  . Physically Abused: Not on file  . Sexually Abused: Not on file    Review of Systems  Constitutional: Negative for fatigue and unexpected weight change.  Eyes: Negative for visual disturbance.  Respiratory: Negative for cough, chest tightness and shortness of breath.   Cardiovascular: Negative for chest pain, palpitations and leg swelling.    Gastrointestinal: Negative for abdominal pain and blood in stool.  Neurological: Negative for dizziness, light-headedness and headaches.     Objective:   Vitals:   03/26/20 0820  BP: 118/76  Pulse: 92  Temp: 98 F (36.7 C)  TempSrc: Temporal  SpO2: 97%  Weight: 205 lb (93 kg)  Height: 5\' 11"  (1.803 m)     Physical Exam Vitals reviewed.  Constitutional:      Appearance: He is well-developed.  HENT:  Head: Normocephalic and atraumatic.  Eyes:     Pupils: Pupils are equal, round, and reactive to light.  Neck:     Vascular: No carotid bruit or JVD.  Cardiovascular:     Rate and Rhythm: Normal rate and regular rhythm.     Heart sounds: Normal heart sounds. No murmur heard.   Pulmonary:     Effort: Pulmonary effort is normal.     Breath sounds: Normal breath sounds. No rales.  Skin:    General: Skin is warm and dry.       Neurological:     Mental Status: He is alert and oriented to person, place, and time.        Assessment & Plan:  Kaled Allende. is a 64 y.o. male . Essential hypertension - Plan: lisinopril (ZESTRIL) 20 MG tablet, amLODipine (NORVASC) 5 MG tablet, Comprehensive metabolic panel  -  Stable, tolerating current regimen. Medications refilled. Labs pending as above.   Special screening for malignant neoplasms, colon - Plan: Ambulatory referral to Gastroenterology  Hyperlipidemia, unspecified hyperlipidemia type - Plan: atorvastatin (LIPITOR) 20 MG tablet, Comprehensive metabolic panel, Lipid panel  -  Stable, tolerating current regimen. Medications refilled. Labs pending as above.   Gout of multiple sites, unspecified cause, unspecified chronicity - Plan: allopurinol (ZYLOPRIM) 100 MG tablet, Uric Acid  -  Stable, tolerating current regimen. Medications refilled. Labs pending as above.   Lipoma of right thigh  -Contusion versus lipoma.  Does have reported lipoma left forearm.  Reassuring exam.  Option of dermatology versus general surgery  eval if not improving or second exam requested.  Meds ordered this encounter  Medications  . atorvastatin (LIPITOR) 20 MG tablet    Sig: take 1 tablet by mouth at bedtime    Dispense:  90 tablet    Refill:  2  . allopurinol (ZYLOPRIM) 100 MG tablet    Sig: Take 1 tablet (100 mg total) by mouth daily.    Dispense:  90 tablet    Refill:  2  . lisinopril (ZESTRIL) 20 MG tablet    Sig: Take 1 tablet (20 mg total) by mouth daily.    Dispense:  90 tablet    Refill:  2  . amLODipine (NORVASC) 5 MG tablet    Sig: Take 1 tablet (5 mg total) by mouth daily.    Dispense:  90 tablet    Refill:  0   Patient Instructions    Bump on leg may be a lipoma or possible contusion from injury previously.  It does not look concerning at this time.  If it is not improving in the next month or 2 or any worsening/enlarging I would recommend meeting with either dermatology or general surgeon to evaluate that area further.  No medication changes at this time.  Follow-up with me in 6 months.  Keep up the good work.   If you have lab work done today you will be contacted with your lab results within the next 2 weeks.  If you have not heard from [place] then please contact us. The fastest way to get your results is to register for My Chart.   IF you received an x-ray today, you will receive an invoice from [facility]. Please contact [facility] at [phone] with questions or concerns regarding your invoice.   IF you received labwork today, you will receive an invoice from [place]. Please contact [facility] at [phone] with questions or concerns regarding your invoice.   Our billing staff  will not be able to assist you with questions regarding bills from these companies.  You will be contacted with the lab results as soon as they are available. The fastest way to get your results is to activate your My Chart account. Instructions are located on the last page of this paperwork. If you have  not heard from [place] regarding the results in 2 weeks, please contact this office.         Signed, Merri Ray, MD [facility]

## 2020-03-26 NOTE — Patient Instructions (Addendum)
  Bump on leg may be a lipoma or possible contusion from injury previously.  It does not look concerning at this time.  If it is not improving in the next month or 2 or any worsening/enlarging I would recommend meeting with either dermatology or general surgeon to evaluate that area further.  No medication changes at this time.  Follow-up with me in 6 months.  Keep up the good work.   If you have lab work done today you will be contacted with your lab results within the next 2 weeks.  If you have not heard from [place] then please contact us. The fastest way to get your results is to register for My Chart.   IF you received an x-ray today, you will receive an invoice from [facility]. Please contact [facility] at [phone] with questions or concerns regarding your invoice.   IF you received labwork today, you will receive an invoice from [place]. Please contact [facility] at [phone] with questions or concerns regarding your invoice.   Our billing staff will not be able to assist you with questions regarding bills from these companies.  You will be contacted with the lab results as soon as they are available. The fastest way to get your results is to activate your My Chart account. Instructions are located on the last page of this paperwork. If you have not heard from [place] regarding the results in 2 weeks, please contact this office.

## 2020-03-27 LAB — COMPREHENSIVE METABOLIC PANEL
ALT: 21 IU/L (ref 0–44)
AST: 36 IU/L (ref 0–40)
Albumin/Globulin Ratio: 2.1 (ref 1.2–2.2)
Albumin: 4.4 g/dL (ref 3.8–4.8)
Alkaline Phosphatase: 65 IU/L (ref 48–121)
BUN/Creatinine Ratio: 13 (ref 10–24)
BUN: 13 mg/dL (ref 8–27)
Bilirubin Total: 0.4 mg/dL (ref 0.0–1.2)
CO2: 21 mmol/L (ref 20–29)
Calcium: 10 mg/dL (ref 8.6–10.2)
Chloride: 102 mmol/L (ref 96–106)
Creatinine, Ser: 0.98 mg/dL (ref 0.76–1.27)
GFR calc Af Amer: 95 mL/min/{1.73_m2} (ref >59)
GFR calc non Af Amer: 82 mL/min/{1.73_m2} (ref >59)
Globulin, Total: 2.1 g/dL (ref 1.5–4.5)
Glucose: 94 mg/dL (ref 65–99)
Potassium: 5 mmol/L (ref 3.5–5.2)
Sodium: 138 mmol/L (ref 134–144)
Total Protein: 6.5 g/dL (ref 6.0–8.5)

## 2020-03-27 LAB — LIPID PANEL
Chol/HDL Ratio: 2.7 ratio (ref 0.0–5.0)
Cholesterol, Total: 153 mg/dL (ref 100–199)
HDL: 56 mg/dL (ref >39)
LDL Chol Calc (NIH): 50 mg/dL (ref 0–99)
Triglycerides: 308 mg/dL — ABNORMAL HIGH (ref 0–149)
VLDL Cholesterol Cal: 47 mg/dL — ABNORMAL HIGH (ref 5–40)

## 2020-03-27 LAB — URIC ACID: Uric Acid: 5.6 mg/dL (ref 3.8–8.4)

## 2020-09-12 ENCOUNTER — Encounter: Payer: Self-pay | Admitting: Family Medicine

## 2020-09-12 ENCOUNTER — Other Ambulatory Visit: Payer: Self-pay

## 2020-09-12 ENCOUNTER — Ambulatory Visit (INDEPENDENT_AMBULATORY_CARE_PROVIDER_SITE_OTHER): Admitting: Family Medicine

## 2020-09-12 VITALS — BP 140/84 | HR 84 | Temp 98.6°F | Ht 71.0 in | Wt 198.0 lb

## 2020-09-12 DIAGNOSIS — M109 Gout, unspecified: Secondary | ICD-10-CM | POA: Diagnosis not present

## 2020-09-12 DIAGNOSIS — Z131 Encounter for screening for diabetes mellitus: Secondary | ICD-10-CM

## 2020-09-12 DIAGNOSIS — E785 Hyperlipidemia, unspecified: Secondary | ICD-10-CM

## 2020-09-12 DIAGNOSIS — I10 Essential (primary) hypertension: Secondary | ICD-10-CM

## 2020-09-12 DIAGNOSIS — Z0000 Encounter for general adult medical examination without abnormal findings: Secondary | ICD-10-CM

## 2020-09-12 DIAGNOSIS — Z0001 Encounter for general adult medical examination with abnormal findings: Secondary | ICD-10-CM | POA: Diagnosis not present

## 2020-09-12 MED ORDER — ALLOPURINOL 100 MG PO TABS: 100.0000 mg | ORAL_TABLET | Freq: Every day | ORAL | 2 refills | Status: DC

## 2020-09-12 MED ORDER — ATORVASTATIN CALCIUM 20 MG PO TABS: ORAL_TABLET | ORAL | 2 refills | Status: DC

## 2020-09-12 MED ORDER — AMLODIPINE BESYLATE 5 MG PO TABS: 5.0000 mg | ORAL_TABLET | Freq: Every day | ORAL | 2 refills | Status: DC

## 2020-09-12 MED ORDER — INDOMETHACIN 50 MG PO CAPS: 50.0000 mg | ORAL_CAPSULE | Freq: Three times a day (TID) | ORAL | 1 refills | Status: DC | PRN

## 2020-09-12 MED ORDER — LISINOPRIL 20 MG PO TABS: 20.0000 mg | ORAL_TABLET | Freq: Every day | ORAL | 2 refills | Status: DC

## 2020-09-12 NOTE — Progress Notes (Addendum)
Subjective:  Patient ID: Daniel Loges., male    DOB: 1956/07/07  Age: 66 y.o. MRN: [id number]  CC:  Chief Complaint  Patient presents with  . Annual Exam    Pt reports he feels well with no complaints.    HPI Daniel Davis. presents for  Annual physical exam  Hypertension: Amlodipine 5 mg daily, lisinopril 20 mg daily. No missed doses. No new side effects. - only taking 1/2 amlodipine.  Home readings: no recent readings.  Alcohol - 5-6 drinks per week.  Some salt to food yesterday.  Wt Readings from Last 3 Encounters:  09/12/20 198 lb (89.8 kg)  03/26/20 205 lb (93 kg)  10/22/19 204 lb (92.5 kg)    BP Readings from Last 3 Encounters:  09/12/20 140/84  03/26/20 118/76  10/22/19 118/76   Lab Results  Component Value Date   CREATININE 0.98 03/26/2020   Hyperlipidemia: Lipitor 20 mg daily. No new side effects/myalgias.  Lab Results  Component Value Date   CHOL 153 03/26/2020   HDL 56 03/26/2020   LDLCALC 50 03/26/2020   TRIG 308 (H) 03/26/2020   CHOLHDL 2.7 03/26/2020   Lab Results  Component Value Date   ALT 21 03/26/2020   AST 36 03/26/2020   ALKPHOS 65 03/26/2020   BILITOT 0.4 03/26/2020   Gout: Last flare: no recent flare.  Daily meds: Allopurinol 100 mg daily, no new side effects. Staying hydrated.  Prn med: Indomethacin 50 mg as needed Lab Results  Component Value Date   LABURIC 5.6 03/26/2020   Cancer screening Colonoscopy 03/12/2015 repeat 5 years recommended - tubular adenoma.  Was told may not need for another year [facility]?  Prostate, history of elevated PSA in February 2021.  Referred to urology. Last visit with Dr. Milford Cage November 8, PSA 5.38, previously 5.62 on August 26, 5.08 in May, 5.1 in February.  Further work-up options were discussed.  Opted for PSA surveillance at 59-month intervals with DRE's and PSA. Lab Results  Component Value Date   PSA1 5.1 (H) 09/10/2019   PSA1 3.6 06/26/2018   PSA1 5.4 (H) 12/20/2017   PSA 2.60  12/24/2015   PSA 2.37 12/16/2014   PSA 1.96 11/26/2013   Immunization History  Administered Date(s) Administered  . Influenza Nasal 05/16/2014, 05/18/2016, 05/05/2017  . Influenza,inj,Quad PF,6+ Mos 06/16/2015, 05/03/2019  . Influenza-Unspecified 05/05/2020  . Moderna Sars-Covid-2 Vaccination 10/18/2019, 11/20/2019, 06/19/2020  . Tdap 12/27/2007, 12/22/2018  . Zoster Recombinat (Shingrix) 12/20/2017   Depression screen [facility] 2/9 09/12/2020 03/26/2020 10/22/2019 09/10/2019 12/29/2018  Decreased Interest 0 0 0 0 0  Down, Depressed, Hopeless 0 1 0 0 0  PHQ - 2 Score 0 1 0 0 0  Altered sleeping - 0 - - -  Tired, decreased energy - 0 - - -  Change in appetite - 1 - - -  Feeling bad or failure about yourself  - 1 - - -  Trouble concentrating - 0 - - -  Moving slowly or fidgety/restless - 0 - - -  Suicidal thoughts - 0 - - -  PHQ-9 Score - 3 - - -    Hearing Screening   125Hz  250Hz  500Hz  1000Hz  2000Hz  3000Hz  4000Hz  6000Hz  8000Hz   Right ear:           Left ear:             Visual Acuity Screening   Right eye Left eye Both eyes  Without correction: 20/15 20/15 20/15   With  correction:     no recent visit with optho- will schedule.   Dental: every 6 months.   Exercise: 3-4 days per week. Had PT for back at [facility] - stretches for back.    History Patient Active Problem List   Diagnosis Date Noted  . Nonspecific abnormal finding in stool contents 02/28/2015  . Family history of cancer 02/28/2015  . OA (osteoarthritis) 02/28/2012  . HTN (hypertension) 11/27/2011  . Lipid disorder 11/27/2011  . Gout attack 11/27/2011   Past Medical History:  Diagnosis Date  . Arthritis   . Colon polyps   . Gout attack   . Hyperlipidemia   . Hypertension    Past Surgical History:  Procedure Laterality Date  . FRACTURE SURGERY  2000   LEFT ankle, ORIF   Allergies  Allergen Reactions  . Gadolinium Derivatives Hives    11/18/17 developed 4-5 hives on neck and face; no other complaints.  Treated  with Benadryl 25mg  PO (pt given 2 more caps to take with him for 3.5 mile drive home).  Needs Benadryl 50mg  PO pre-med prior to next MRI w/ gad, per Dr. Nelson Chimes.  Marland Kitchen Methocarbamol Hives   Prior to Admission medications   Medication Sig Start Date End Date Taking? Authorizing Provider  allopurinol (ZYLOPRIM) 100 MG tablet Take 1 tablet (100 mg total) by mouth daily. 03/26/20  Yes [name], MD  amLODipine (NORVASC) 5 MG tablet Take 1 tablet (5 mg total) by mouth daily. 03/26/20  Yes [name], MD  aspirin 81 MG tablet Take 81 mg by mouth daily.   Yes [provider]  atorvastatin (LIPITOR) 20 MG tablet take 1 tablet by mouth at bedtime 03/26/20  Yes [name], MD  ferrous sulfate 325 (65 FE) MG tablet Take 325 mg by mouth daily with breakfast.   Yes [provider]  indomethacin (INDOCIN) 50 MG capsule Take 1 capsule (50 mg total) by mouth 3 (three) times daily as needed. for MILD PAIN(GOUT) 10/23/19  Yes [name], MD  lisinopril (ZESTRIL) 20 MG tablet Take 1 tablet (20 mg total) by mouth daily. 03/26/20  Yes [name], MD   Social History   Socioeconomic History  . Marital status: Divorced    Spouse name: [name]  . Number of children: 1  . Years of education: 14+  . Highest education level: Not on file  Occupational History  . Occupation: SHIFT SUPERVISOR    Employer: [employer]  Tobacco Use  . Smoking status: Never Smoker  . Smokeless tobacco: Never Used  Vaping Use  . Vaping Use: Never used  Substance and Sexual Activity  . Alcohol use: Yes    Alcohol/week: 3.0 standard drinks    Types: 3 Standard drinks or equivalent per week    Comment: 3-5 drinks  . Drug use: No  . Sexual activity: Yes    Partners: Female  Other Topics Concern  . Not on file  Social History Narrative   Lives with his wife and their daughter.   Exercise: Walking   Education: some [employer]   Financial  Resource Strain: Not on file  Food Insecurity: Not on file  Transportation Needs: Not on file  Physical Activity: Not on file  Stress: Not on file  Social Connections: Not on file  Intimate Partner Violence: Not on file    Review of Systems 13 point review of systems per patient health survey noted.  Negative other  than as indicated above or in HPI.    Objective:   Vitals:   09/12/20 0825 09/12/20 0835  BP: (!) 157/88 140/84  Pulse: 84   Temp: 98.6 F (37 C)   TempSrc: Temporal   SpO2: 97%   Weight: 198 lb (89.8 kg)   Height: 5\' 11"  (1.803 m)      Physical Exam Vitals reviewed.  Constitutional:      Appearance: He is well-developed.  HENT:     Head: Normocephalic and atraumatic.     Right Ear: External ear normal.     Left Ear: External ear normal.  Eyes:     Conjunctiva/sclera: Conjunctivae normal.     Pupils: Pupils are equal, round, and reactive to light.  Neck:     Thyroid: No thyromegaly.  Cardiovascular:     Rate and Rhythm: Normal rate and regular rhythm.     Heart sounds: Normal heart sounds.  Pulmonary:     Effort: Pulmonary effort is normal. No respiratory distress.     Breath sounds: Normal breath sounds. No wheezing.  Abdominal:     General: There is no distension.     Palpations: Abdomen is soft.     Tenderness: There is no abdominal tenderness.     Hernia: There is no hernia in the left inguinal area or right inguinal area.  Musculoskeletal:        General: No tenderness. Normal range of motion.     Cervical back: Normal range of motion and neck supple.  Lymphadenopathy:     Cervical: No cervical adenopathy.  Skin:    General: Skin is warm and dry.  Neurological:     Mental Status: He is alert and oriented to person, place, and time.     Deep Tendon Reflexes: Reflexes are normal and symmetric.  Psychiatric:        Behavior: Behavior normal.     Assessment & Plan:  Daniel Davis. is a 65 y.o. male . Annual physical exam  -  -anticipatory guidance as below in AVS, screening labs above. Health maintenance items as above in HPI discussed/recommended as applicable Advised to call for repeat colonoscopy, continue follow-up with urology, schedule ophthalmology/optometry visit..   Addendum.  Patient spoke with gastroenterology while still in the office and was advised he does not need repeat colonoscopy until August 2023 based on new guidelines.  Gout of multiple sites, unspecified cause, unspecified chronicity - Plan: allopurinol (ZYLOPRIM) 100 MG tablet, indomethacin (INDOCIN) 50 MG capsule  -  Stable, tolerating current regimen. Medications refilled. Labs pending as above.   Essential hypertension - Plan: amLODipine (NORVASC) 5 MG tablet, lisinopril (ZESTRIL) 20 MG tablet, Lipid panel, Comprehensive metabolic panel  -Borderline elevated.  Recommended full dose amlodipine from readings above 130/80.  RTC precautions  Hyperlipidemia, unspecified hyperlipidemia type - Plan: atorvastatin (LIPITOR) 20 MG tablet, Lipid panel, Comprehensive metabolic panel  -Tolerating current regimen, check labs  Screening for diabetes mellitus (DM) - Plan: Hemoglobin A1c   No orders of the defined types were placed in this encounter.  Patient Instructions       If you have lab work done today you will be contacted with your lab results within the next 2 weeks.  If you have not heard from [place] then please contact us. The fastest way to get your results is to register for My Chart.   IF you received an x-ray today, you will receive an invoice from [facility]. Please contact [facility] at [phone]  with questions or concerns regarding your invoice.   IF you received labwork today, you will receive an invoice from [place]. Please contact [facility] at [phone] with questions or concerns regarding your invoice.   Our billing staff will not be able to assist you with questions regarding bills from these  companies.  You will be contacted with the lab results as soon as they are available. The fastest way to get your results is to activate your My Chart account. Instructions are located on the last page of this paperwork. If you have not heard from [place] regarding the results in 2 weeks, please contact this office.         Signed, Merri Ray, MD [facility]

## 2020-09-12 NOTE — Patient Instructions (Addendum)
Keep a record of your blood pressures outside of the office and if over 130/80 - take full pill of amlodipine.  You are due for repeat colonoscopy - call Dr. Kelby Fam office - [phone].   Schedule eye doctor visit.   No other medication changes today.  Follow-up in 6 months but please let me know if there are questions sooner.  Take care.   Keeping you healthy  Get these tests  Blood pressure- Have your blood pressure checked once a year by your healthcare provider.  Normal blood pressure is 120/80  Weight- Have your body mass index (BMI) calculated to screen for obesity.  BMI is a measure of body fat based on height and weight. You can also calculate your own BMI at [employer].si.  Cholesterol- Have your cholesterol checked every year.  Diabetes- Have your blood sugar checked regularly if you have high blood pressure, high cholesterol, have a family history of diabetes or if you are overweight.  Screening for Colon Cancer- Colonoscopy starting at age 61.  Screening may begin sooner depending on your family history and other health conditions. Follow up colonoscopy as directed by your Gastroenterologist.  Screening for Prostate Cancer- Both blood work (PSA) and a rectal exam help screen for Prostate Cancer.  Screening begins at age 56 with African-American men and at age 33 with Caucasian men.  Screening may begin sooner depending on your family history.   Take these medicines  Flu shot- Every fall.  Tetanus- Every 10 years.  Pneumonia shot- Once after the age of 32; if you are younger than 66, ask your healthcare provider if you need a Pneumonia shot.  Take these steps  Don't smoke- If you do smoke, talk to your doctor about quitting.  For tips on how to quit, go to www.smokefree.gov or call 1-800-QUIT-NOW.  Be physically active- Exercise 5 days a week for at least 30 minutes.  If you are not already physically active start slow and gradually work up to 30 minutes  of moderate physical activity.  Examples of moderate activity include walking briskly, mowing the yard, dancing, swimming, bicycling, etc.  Eat a healthy diet- Eat a variety of healthy food such as fruits, vegetables, low fat milk, low fat cheese, yogurt, lean meant, poultry, fish, beans, tofu, etc. For more information go to www.thenutritionsource.org  Drink alcohol in moderation- Limit alcohol intake to less than two drinks a day. Never drink and drive.  Dentist- Brush and floss twice daily; visit your dentist twice a year.  Depression- Your emotional health is as important as your physical health. If you're feeling down, or losing interest in things you would normally enjoy please talk to your healthcare provider.  Eye exam- Visit your eye doctor every year.  Safe sex- If you may be exposed to a sexually transmitted infection, use a condom.  Seat belts- Seat belts can save your life; always wear one.  Smoke/Carbon Monoxide detectors- These detectors need to be installed on the appropriate level of your home.  Replace batteries at least once a year.  Skin cancer- When out in the sun, cover up and use sunscreen 15 SPF or higher.  Violence- If anyone is threatening you, please tell your healthcare provider.  Living Will/ Health care power of attorney- Speak with your healthcare provider and family.  If you have lab work done today you will be contacted with your lab results within the next 2 weeks.  If you have not heard from [place] then please contact us.  The fastest way to get your results is to register for My Chart.   IF you received an x-ray today, you will receive an invoice from [facility]. Please contact [facility] at [phone] with questions or concerns regarding your invoice.   IF you received labwork today, you will receive an invoice from [place]. Please contact [facility] at [phone] with questions or concerns regarding your invoice.   Our billing  staff will not be able to assist you with questions regarding bills from these companies.  You will be contacted with the lab results as soon as they are available. The fastest way to get your results is to activate your My Chart account. Instructions are located on the last page of this paperwork. If you have not heard from [place] regarding the results in 2 weeks, please contact this office.

## 2020-09-13 LAB — LIPID PANEL
Chol/HDL Ratio: 3.1 ratio (ref 0.0–5.0)
Cholesterol, Total: 147 mg/dL (ref 100–199)
HDL: 48 mg/dL (ref >39)
LDL Chol Calc (NIH): 54 mg/dL (ref 0–99)
Triglycerides: 293 mg/dL — ABNORMAL HIGH (ref 0–149)
VLDL Cholesterol Cal: 45 mg/dL — ABNORMAL HIGH (ref 5–40)

## 2020-09-13 LAB — COMPREHENSIVE METABOLIC PANEL
ALT: 18 IU/L (ref 0–44)
AST: 33 IU/L (ref 0–40)
Albumin/Globulin Ratio: 1.8 (ref 1.2–2.2)
Albumin: 4.4 g/dL (ref 3.8–4.8)
Alkaline Phosphatase: 64 IU/L (ref 44–121)
BUN/Creatinine Ratio: 7 — ABNORMAL LOW (ref 10–24)
BUN: 6 mg/dL — ABNORMAL LOW (ref 8–27)
Bilirubin Total: 0.3 mg/dL (ref 0.0–1.2)
CO2: 19 mmol/L — ABNORMAL LOW (ref 20–29)
Calcium: 9.5 mg/dL (ref 8.6–10.2)
Chloride: 102 mmol/L (ref 96–106)
Creatinine, Ser: 0.87 mg/dL (ref 0.76–1.27)
GFR calc Af Amer: 107 mL/min/{1.73_m2} (ref >59)
GFR calc non Af Amer: 92 mL/min/{1.73_m2} (ref >59)
Globulin, Total: 2.5 g/dL (ref 1.5–4.5)
Glucose: 86 mg/dL (ref 65–99)
Potassium: 4.4 mmol/L (ref 3.5–5.2)
Sodium: 137 mmol/L (ref 134–144)
Total Protein: 6.9 g/dL (ref 6.0–8.5)

## 2020-09-13 LAB — HEMOGLOBIN A1C
Est. average glucose Bld gHb Est-mCnc: 123 mg/dL
Hgb A1c MFr Bld: 5.9 % — ABNORMAL HIGH (ref 4.8–5.6)

## 2021-03-11 ENCOUNTER — Ambulatory Visit (INDEPENDENT_AMBULATORY_CARE_PROVIDER_SITE_OTHER): Admitting: Family Medicine

## 2021-03-11 ENCOUNTER — Encounter: Payer: Self-pay | Admitting: Family Medicine

## 2021-03-11 ENCOUNTER — Other Ambulatory Visit: Payer: Self-pay

## 2021-03-11 VITALS — BP 126/74 | HR 78 | Temp 98.0°F | Resp 16 | Ht 71.0 in | Wt 189.2 lb

## 2021-03-11 DIAGNOSIS — R7303 Prediabetes: Secondary | ICD-10-CM

## 2021-03-11 DIAGNOSIS — E785 Hyperlipidemia, unspecified: Secondary | ICD-10-CM

## 2021-03-11 DIAGNOSIS — M109 Gout, unspecified: Secondary | ICD-10-CM

## 2021-03-11 DIAGNOSIS — R3129 Other microscopic hematuria: Secondary | ICD-10-CM

## 2021-03-11 DIAGNOSIS — I10 Essential (primary) hypertension: Secondary | ICD-10-CM

## 2021-03-11 DIAGNOSIS — R972 Elevated prostate specific antigen [PSA]: Secondary | ICD-10-CM

## 2021-03-11 DIAGNOSIS — Z131 Encounter for screening for diabetes mellitus: Secondary | ICD-10-CM

## 2021-03-11 LAB — COMPREHENSIVE METABOLIC PANEL
ALT: 51 U/L (ref 0–53)
AST: 115 U/L — ABNORMAL HIGH (ref 0–37)
Albumin: 4.1 g/dL (ref 3.5–5.2)
Alkaline Phosphatase: 74 U/L (ref 39–117)
BUN: 9 mg/dL (ref 6–23)
CO2: 22 mEq/L (ref 19–32)
Calcium: 9.4 mg/dL (ref 8.4–10.5)
Chloride: 103 mEq/L (ref 96–112)
Creatinine, Ser: 0.8 mg/dL (ref 0.40–1.50)
GFR: 94.34 mL/min (ref >60.00)
Glucose, Bld: 84 mg/dL (ref 70–99)
Potassium: 4.7 mEq/L (ref 3.5–5.1)
Sodium: 135 mEq/L (ref 135–145)
Total Bilirubin: 0.5 mg/dL (ref 0.2–1.2)
Total Protein: 6.4 g/dL (ref 6.0–8.3)

## 2021-03-11 LAB — HEMOGLOBIN A1C: Hgb A1c MFr Bld: 6.2 % (ref 4.6–6.5)

## 2021-03-11 LAB — LIPID PANEL
Cholesterol: 139 mg/dL (ref 0–200)
HDL: 64.4 mg/dL (ref >39.00)
NonHDL: 75.07
Total CHOL/HDL Ratio: 2
Triglycerides: 275 mg/dL — ABNORMAL HIGH (ref 0.0–149.0)
VLDL: 55 mg/dL — ABNORMAL HIGH (ref 0.0–40.0)

## 2021-03-11 LAB — LDL CHOLESTEROL, DIRECT: Direct LDL: 44 mg/dL

## 2021-03-11 MED ORDER — INDOMETHACIN 50 MG PO CAPS: 50.0000 mg | ORAL_CAPSULE | Freq: Three times a day (TID) | ORAL | 1 refills | Status: DC | PRN

## 2021-03-11 MED ORDER — AMLODIPINE BESYLATE 5 MG PO TABS: 5.0000 mg | ORAL_TABLET | Freq: Every day | ORAL | 2 refills | Status: DC

## 2021-03-11 MED ORDER — ATORVASTATIN CALCIUM 20 MG PO TABS: ORAL_TABLET | ORAL | 2 refills | Status: DC

## 2021-03-11 MED ORDER — ALLOPURINOL 100 MG PO TABS: 100.0000 mg | ORAL_TABLET | Freq: Every day | ORAL | 2 refills | Status: DC

## 2021-03-11 MED ORDER — LISINOPRIL 20 MG PO TABS: 20.0000 mg | ORAL_TABLET | Freq: Every day | ORAL | 2 refills | Status: DC

## 2021-03-11 NOTE — Progress Notes (Signed)
Subjective:  Patient ID: Daniel Davis., male    DOB: 23-Jun-1956  Age: 65 y.o. MRN: [id number]  CC:  Chief Complaint  Patient presents with   Gout    Pt here to review medications and ask for refills   Hypertension    Pt denies physical sxs, no concerns from pt, denies taking BP at home other than rare occasion    Hyperlipidemia    Pt is due for lab work today as well as refills     HPI Daniel Davis. presents for   Hypertension: Amlodipine '5mg'$ , lisinopril 20 mg qd.  Home readings:none.  No new med side effects.  BP Readings from Last 3 Encounters:  03/11/21 126/74  09/12/20 140/84  03/26/20 118/76   Lab Results  Component Value Date   CREATININE 0.87 09/12/2020    Hematuria: Seeing urologist for hematuria, elevated PSA. Stable last visit .  Had negative workup with CT and cystoscopy. 6 month follow up planned. No gross hematuria, normal urination now.  Note reviewed from 01/19/21. Continued monitoring of PSA in 6 months.    Hyperlipidemia: Lipitor 20 mg daily. No new myalgias or side effects.  Lab Results  Component Value Date   CHOL 147 09/12/2020   HDL 48 09/12/2020   LDLCALC 54 09/12/2020   TRIG 293 (H) 09/12/2020   CHOLHDL 3.1 09/12/2020   Lab Results  Component Value Date   ALT 18 09/12/2020   AST 33 09/12/2020   ALKPHOS 64 09/12/2020   BILITOT 0.3 09/12/2020   Gout: Last flare:none.  Daily meds: Allopurinol 100 mg daily Prn med: Indomethacin 50 mg 3 times daily as needed  - 1 last week for stiff knee. Resolved.  Lab Results  Component Value Date   LABURIC 5.6 03/26/2020    Prediabetes: Lifestyle modification discussed previously, he has lost 9 pounds.  Increased walking, change in diet. Diet pill otc.  Lab Results  Component Value Date   HGBA1C 5.9 (H) 09/12/2020   Wt Readings from Last 3 Encounters:  03/11/21 189 lb 3.2 oz (85.8 kg)  09/12/20 198 lb (89.8 kg)  03/26/20 205 lb (93 kg)        History Patient Active Problem  List   Diagnosis Date Noted   Nonspecific abnormal finding in stool contents 02/28/2015   Family history of cancer 02/28/2015   OA (osteoarthritis) 02/28/2012   HTN (hypertension) 11/27/2011   Lipid disorder 11/27/2011   Gout attack 11/27/2011   Past Medical History:  Diagnosis Date   Arthritis    Colon polyps    Gout attack    Hyperlipidemia    Hypertension    Past Surgical History:  Procedure Laterality Date   FRACTURE SURGERY  2000   LEFT ankle, ORIF   Allergies  Allergen Reactions   Gadolinium Derivatives Hives    11/18/17 developed 4-5 hives on neck and face; no other complaints.  Treated with Benadryl '25mg'$  PO (pt given 2 more caps to take with him for 3.5 mile drive home).  Needs Benadryl '50mg'$  PO pre-med prior to next MRI w/ gad, per Dr. Nelson Chimes.   Methocarbamol Hives   Prior to Admission medications   Medication Sig Start Date End Date Taking? Authorizing Provider  allopurinol (ZYLOPRIM) 100 MG tablet Take 1 tablet (100 mg total) by mouth daily. 09/12/20  Yes [name], MD  amLODipine (NORVASC) 5 MG tablet Take 1 tablet (5 mg total) by mouth daily. 09/12/20  Yes [name], MD  aspirin 81 MG tablet Take 81 mg by mouth daily.   Yes [provider]  atorvastatin (LIPITOR) 20 MG tablet take 1 tablet by mouth at bedtime 09/12/20  Yes [name], MD  ferrous sulfate 325 (65 FE) MG tablet Take 325 mg by mouth daily with breakfast.   Yes [provider]  indomethacin (INDOCIN) 50 MG capsule Take 1 capsule (50 mg total) by mouth 3 (three) times daily as needed. for MILD PAIN(GOUT) 09/12/20  Yes [name], MD  lisinopril (ZESTRIL) 20 MG tablet Take 1 tablet (20 mg total) by mouth daily. 09/12/20  Yes [name], MD   Social History   Socioeconomic History   Marital status: Divorced    Spouse name: [name]   Number of children: 1   Years of education: 14+   Highest education level: Not on file  Occupational History    Occupation: SHIFT SUPERVISOR    Employer: [employer]  Tobacco Use   Smoking status: Never   Smokeless tobacco: Never  Vaping Use   Vaping Use: Never used  Substance and Sexual Activity   Alcohol use: Yes    Alcohol/week: 3.0 standard drinks    Types: 3 Standard drinks or equivalent per week    Comment: 3-5 drinks   Drug use: No   Sexual activity: Yes    Partners: Female  Other Topics Concern   Not on file  Social History Narrative   Lives with his wife and their daughter.   Exercise: Walking   Education: some [employer]   Financial Resource Strain: Not on file  Food Insecurity: Not on file  Transportation Needs: Not on file  Physical Activity: Not on file  Stress: Not on file  Social Connections: Not on file  Intimate Partner Violence: Not on file    Review of Systems  Constitutional:  Negative for fatigue and unexpected weight change.  Eyes:  Negative for visual disturbance.  Respiratory:  Negative for cough, chest tightness and shortness of breath.   Cardiovascular:  Negative for chest pain, palpitations and leg swelling.  Gastrointestinal:  Negative for abdominal pain and blood in stool.  Neurological:  Negative for dizziness, light-headedness and headaches.    Objective:   Vitals:   03/11/21 0746  BP: 126/74  Pulse: 78  Resp: 16  Temp: 98 F (36.7 C)  TempSrc: Temporal  SpO2: 97%  Weight: 189 lb 3.2 oz (85.8 kg)  Height: '5\' 11"'$  (1.803 m)     Physical Exam Vitals reviewed.  Constitutional:      Appearance: He is well-developed.  HENT:     Head: Normocephalic and atraumatic.  Neck:     Vascular: No carotid bruit or JVD.  Cardiovascular:     Rate and Rhythm: Normal rate and regular rhythm.     Heart sounds: Normal heart sounds. No murmur heard. Pulmonary:     Effort: Pulmonary effort is normal.     Breath sounds: Normal breath sounds. No rales.  Musculoskeletal:     Right lower leg: No edema.     Left lower  leg: No edema.  Skin:    General: Skin is warm and dry.  Neurological:     Mental Status: He is alert and oriented to person, place, and time.  Psychiatric:        Mood and Affect: Mood normal.       Assessment & Plan:  Daniel Davis. is a 65 y.o.  male . Prediabetes - Plan: Hemoglobin A1c  -Commended on diet changes and weight loss.  Continue lifestyle modification.  Anticipate improved A1c.  Has used a diet pill.  We discussed that I am unsure of the safety of that medication and would ideally recommend diet/exercise approach given his history of hypertension, hyperlipidemia and unknown ingredients in the diet pill.  Gout of multiple sites, unspecified cause, unspecified chronicity - Plan: allopurinol (ZYLOPRIM) 100 MG tablet, indomethacin (INDOCIN) 50 MG capsule  -Well-controlled on current regimen, continue same  Essential hypertension - Plan: amLODipine (NORVASC) 5 MG tablet, lisinopril (ZESTRIL) 20 MG tablet, Comprehensive metabolic panel  -Improved, continue same regimen, check labs  Hyperlipidemia, unspecified hyperlipidemia type - Plan: atorvastatin (LIPITOR) 20 MG tablet, Lipid panel, Comprehensive metabolic panel  -Tolerating current regimen, check labs  Other microscopic hematuria  -New problem in May.  Reports undergoing cystoscopy, CT without concerns.  Plan for 79-monthfollow-up for recheck with urology.  Denies recurrence of symptoms at this time.  Elevated PSA  -Marginal elevation, followed by urology, 61-monthollow-up planned with most recent testing noted in May stable.  Meds ordered this encounter  Medications   allopurinol (ZYLOPRIM) 100 MG tablet    Sig: Take 1 tablet (100 mg total) by mouth daily.    Dispense:  90 tablet    Refill:  2   amLODipine (NORVASC) 5 MG tablet    Sig: Take 1 tablet (5 mg total) by mouth daily.    Dispense:  90 tablet    Refill:  2   atorvastatin (LIPITOR) 20 MG tablet    Sig: take 1 tablet by mouth at bedtime    Dispense:   90 tablet    Refill:  2   indomethacin (INDOCIN) 50 MG capsule    Sig: Take 1 capsule (50 mg total) by mouth 3 (three) times daily as needed. for MILD PAIN(GOUT)    Dispense:  60 capsule    Refill:  1   lisinopril (ZESTRIL) 20 MG tablet    Sig: Take 1 tablet (20 mg total) by mouth daily.    Dispense:  90 tablet    Refill:  2   Patient Instructions  Keep up the good work with diet and exercise.  I suspect your labs will be much better including a prediabetes test.  No medication changes today.  Thanks for coming in today.  Recheck in 6 months but let me know if there are questions sooner.     Signed,   JeMerri RayMD [facility] 03/11/21 8:28 AM

## 2021-03-11 NOTE — Patient Instructions (Signed)
Keep up the good work with diet and exercise.  I suspect your labs will be much better including a prediabetes test.  No medication changes today.  Thanks for coming in today.  Recheck in 6 months but let me know if there are questions sooner.

## 2021-03-24 ENCOUNTER — Other Ambulatory Visit: Payer: Self-pay | Admitting: Family Medicine

## 2021-03-24 DIAGNOSIS — R7401 Elevation of levels of liver transaminase levels: Secondary | ICD-10-CM

## 2021-03-24 NOTE — Progress Notes (Signed)
See lab notes, elevated AST.

## 2021-09-14 ENCOUNTER — Ambulatory Visit (INDEPENDENT_AMBULATORY_CARE_PROVIDER_SITE_OTHER): Admitting: Family Medicine

## 2021-09-14 ENCOUNTER — Encounter: Payer: Self-pay | Admitting: Family Medicine

## 2021-09-14 VITALS — BP 128/74 | HR 84 | Temp 97.9°F | Resp 16 | Ht 71.0 in | Wt 193.4 lb

## 2021-09-14 DIAGNOSIS — M109 Gout, unspecified: Secondary | ICD-10-CM

## 2021-09-14 DIAGNOSIS — I10 Essential (primary) hypertension: Secondary | ICD-10-CM

## 2021-09-14 DIAGNOSIS — R7303 Prediabetes: Secondary | ICD-10-CM

## 2021-09-14 DIAGNOSIS — E785 Hyperlipidemia, unspecified: Secondary | ICD-10-CM

## 2021-09-14 DIAGNOSIS — Z0000 Encounter for general adult medical examination without abnormal findings: Secondary | ICD-10-CM

## 2021-09-14 LAB — HEMOGLOBIN A1C: Hgb A1c MFr Bld: 6.2 % (ref 4.6–6.5)

## 2021-09-14 MED ORDER — AMLODIPINE BESYLATE 5 MG PO TABS: 5.0000 mg | ORAL_TABLET | Freq: Every day | ORAL | 2 refills | Status: DC

## 2021-09-14 MED ORDER — ALLOPURINOL 100 MG PO TABS: 100.0000 mg | ORAL_TABLET | Freq: Every day | ORAL | 2 refills | Status: DC

## 2021-09-14 MED ORDER — LISINOPRIL 20 MG PO TABS: 20.0000 mg | ORAL_TABLET | Freq: Every day | ORAL | 2 refills | Status: DC

## 2021-09-14 MED ORDER — ATORVASTATIN CALCIUM 20 MG PO TABS: ORAL_TABLET | ORAL | 2 refills | Status: DC

## 2021-09-14 NOTE — Progress Notes (Signed)
Subjective:  Patient ID: Daniel Loges., male    DOB: 1955/11/15  Age: 66 y.o. MRN: [id number]  CC:  Chief Complaint  Patient presents with   Annual Exam    Pt here for annual exam, notes was told his colon was enlarged and he will be having biopsy done coming up FYI Dr Selmer Dominion     HPI Daniel Loges. presents for   Annual physical exam.  History of elevated PSA,  Followed by urology, Dr. Milford Cage.  Plan for biopsy soon. CT abdomen pelvis for hematuria in July 2022.  Colonic diverticulosis without diverticulitis.  Aortic atherosclerosis.  No CT findings to account for hematuria. Biopsy March 1st.   Appears he was seen by his primary care through the [place] on 03/31/2021.  1 year follow-up.  Hypertension: Amlodipine 5 mg daily, lisinopril 20 mg daily. No new side effects.  Home readings: rare - controlled.  BP Readings from Last 3 Encounters:  09/14/21 128/74  03/11/21 126/74  09/12/20 140/84   Lab Results  Component Value Date   CREATININE 0.80 03/11/2021   Prediabetes: Diet/exercise approach discussed previously.  Weight is up a few pounds today.  Also plan to cut back alcohol at his last [place] primary care visit. Walking for activity, less past few weeks.  Fast food.  Cranberry juice - 8 oz, no sugar. No soda.  No soda/sweet tea. Few sweets at times.  Lab Results  Component Value Date   HGBA1C 6.2 03/11/2021   Wt Readings from Last 3 Encounters:  09/14/21 193 lb 6.4 oz (87.7 kg)  03/11/21 189 lb 3.2 oz (85.8 kg)  09/12/20 198 lb (89.8 kg)   Hyperlipidemia: Lipitor 20 mg daily, no new myalgias.  Lab Results  Component Value Date   CHOL 139 03/11/2021   HDL 64.40 03/11/2021   LDLCALC 54 09/12/2020   LDLDIRECT 44.0 03/11/2021   TRIG 275.0 (H) 03/11/2021   CHOLHDL 2 03/11/2021   Lab Results  Component Value Date   ALT 51 03/11/2021   AST 115 (H) 03/11/2021   ALKPHOS 74 03/11/2021   BILITOT 0.5 03/11/2021   Gout: Last flare: none recent.  Daily meds:  Allopurinol 100 mg daily Prn med: Indomethacin if needed 3 times daily Lab Results  Component Value Date   LABURIC 5.6 03/26/2020   Cancer screening Colonoscopy 03/12/2015, 5-year repeat recommended, prior tubular adenoma. Advised 7 years as change - plans this year. Has not scheduled.  Followed by urology as above for abnormal PSA/hematuria.   Immunization History  Administered Date(s) Administered   Influenza Nasal 05/16/2014, 05/18/2016, 05/05/2017   Influenza,inj,Quad PF,6+ Mos 06/16/2015, 05/03/2019   Influenza-Unspecified 05/05/2020, 05/20/2021   Moderna Sars-Covid-2 Vaccination 10/18/2019, 11/20/2019, 06/19/2020, 12/23/2020   Tdap 12/27/2007, 12/22/2018   Zoster Recombinat (Shingrix) 12/20/2017, 04/16/2019  Bivalent COVID booster discussed as option.   Depression screen [facility] 2/9 09/14/2021 03/11/2021 09/12/2020 03/26/2020 10/22/2019  Decreased Interest 1 0 0 0 0  Down, Depressed, Hopeless 0 0 0 1 0  PHQ - 2 Score 1 0 0 1 0  Altered sleeping - - - 0 -  Tired, decreased energy - - - 0 -  Change in appetite - - - 1 -  Feeling bad or failure about yourself  - - - 1 -  Trouble concentrating - - - 0 -  Moving slowly or fidgety/restless - - - 0 -  Suicidal thoughts - - - 0 -  PHQ-9 Score - - - 3 -  Vision Screening   Right eye Left eye Both eyes  Without correction 20/20 20/15-1 20/15  With correction     Plans to schedule optho visit, wears glasses.   Dental: Few months ago, every 6 months.   Alcohol: 2 mini bottles of liquor per day.   Tobacco: None.   Exercise/diet: Walking daily. Usually 17min.    History Patient Active Problem List   Diagnosis Date Noted   Nonspecific abnormal finding in stool contents 02/28/2015   Family history of cancer 02/28/2015   OA (osteoarthritis) 02/28/2012   HTN (hypertension) 11/27/2011   Lipid disorder 11/27/2011   Gout attack 11/27/2011   Past Medical History:  Diagnosis Date   Arthritis    Colon polyps    Gout attack     Hyperlipidemia    Hypertension    Past Surgical History:  Procedure Laterality Date   FRACTURE SURGERY  2000   LEFT ankle, ORIF   Allergies  Allergen Reactions   Gadolinium Derivatives Hives    11/18/17 developed 4-5 hives on neck and face; no other complaints.  Treated with Benadryl 25mg  PO (pt given 2 more caps to take with him for 3.5 mile drive home).  Needs Benadryl 50mg  PO pre-med prior to next MRI w/ gad, per Dr. Nelson Chimes.   Methocarbamol Hives   Prior to Admission medications   Medication Sig Start Date End Date Taking? Authorizing Provider  allopurinol (ZYLOPRIM) 100 MG tablet Take 1 tablet (100 mg total) by mouth daily. 03/11/21  Yes [name], MD  amLODipine (NORVASC) 5 MG tablet Take 1 tablet (5 mg total) by mouth daily. 03/11/21  Yes [name], MD  aspirin 81 MG tablet Take 81 mg by mouth daily.   Yes [provider]  atorvastatin (LIPITOR) 20 MG tablet take 1 tablet by mouth at bedtime 03/11/21  Yes [name], MD  ferrous sulfate 325 (65 FE) MG tablet Take 325 mg by mouth daily with breakfast.   Yes [provider]  indomethacin (INDOCIN) 50 MG capsule Take 1 capsule (50 mg total) by mouth 3 (three) times daily as needed. for MILD PAIN(GOUT) 03/11/21  Yes [name], MD  lisinopril (ZESTRIL) 20 MG tablet Take 1 tablet (20 mg total) by mouth daily. 03/11/21  Yes [name], MD   Social History   Socioeconomic History   Marital status: Divorced    Spouse name: [name]   Number of children: 1   Years of education: 14+   Highest education level: Not on file  Occupational History   Occupation: SHIFT SUPERVISOR    Employer: [employer]  Tobacco Use   Smoking status: Never   Smokeless tobacco: Never  Vaping Use   Vaping Use: Never used  Substance and Sexual Activity   Alcohol use: Yes    Alcohol/week: 3.0 standard drinks    Types: 3 Standard drinks or equivalent per week    Comment: 3-5 drinks   Drug use:  No   Sexual activity: Yes    Partners: Female  Other Topics Concern   Not on file  Social History Narrative   Lives with his wife and their daughter.   Exercise: Walking   Education: some [employer]   Financial Resource Strain: Not on file  Food Insecurity: Not on file  Transportation Needs: Not on file  Physical Activity: Not on file  Stress: Not on file  Social Connections: Not on file  Intimate Partner  Violence: Not on file    Review of Systems 13 point review of systems per patient health survey noted.  Negative other than as indicated above or in HPI.   Objective:   Vitals:   09/14/21 0804  BP: 128/74  Pulse: 84  Resp: 16  Temp: 97.9 F (36.6 C)  TempSrc: Temporal  SpO2: 95%  Weight: 193 lb 6.4 oz (87.7 kg)  Height: 5\' 11"  (1.803 m)    Physical Exam Vitals reviewed.  Constitutional:      Appearance: He is well-developed.  HENT:     Head: Normocephalic and atraumatic.     Right Ear: External ear normal.     Left Ear: External ear normal.  Eyes:     Conjunctiva/sclera: Conjunctivae normal.     Pupils: Pupils are equal, round, and reactive to light.  Neck:     Thyroid: No thyromegaly.  Cardiovascular:     Rate and Rhythm: Normal rate and regular rhythm.     Heart sounds: Normal heart sounds.  Pulmonary:     Effort: Pulmonary effort is normal. No respiratory distress.     Breath sounds: Normal breath sounds. No wheezing.  Abdominal:     General: There is no distension.     Palpations: Abdomen is soft.     Tenderness: There is no abdominal tenderness.  Musculoskeletal:        General: No tenderness. Normal range of motion.     Cervical back: Normal range of motion and neck supple.  Lymphadenopathy:     Cervical: No cervical adenopathy.  Skin:    General: Skin is warm and dry.  Neurological:     Mental Status: He is alert and oriented to person, place, and time.     Deep Tendon Reflexes: Reflexes are normal and  symmetric.  Psychiatric:        Mood and Affect: Mood normal.        Behavior: Behavior normal.    Assessment & Plan:  Saige Busby. is a 66 y.o. male . Annual physical exam  - -anticipatory guidance as below in AVS, screening labs above. Health maintenance items as above in HPI discussed/recommended as applicable.   -Number provided for scheduling colonoscopy, follow-up with urology as planned for biopsy as above.  Hyperlipidemia, unspecified hyperlipidemia type - Plan: atorvastatin (LIPITOR) 20 MG tablet, Comprehensive metabolic panel, Lipid panel  -  Stable, tolerating current regimen. Medications refilled. Labs pending as above.   Gout of multiple sites, unspecified cause, unspecified chronicity - Plan: allopurinol (ZYLOPRIM) 100 MG tablet  -  Stable, tolerating current regimen. Medications refilled. Labs pending as above.   Essential hypertension - Plan: lisinopril (ZESTRIL) 20 MG tablet, amLODipine (NORVASC) 5 MG tablet, Comprehensive metabolic panel  -  Stable, tolerating current regimen. Medications refilled. Labs pending as above.   Prediabetes - Plan: Hemoglobin A1c  -  Stable, tolerating current regimen.. Labs pending as above.    Meds ordered this encounter  Medications   atorvastatin (LIPITOR) 20 MG tablet    Sig: take 1 tablet by mouth at bedtime    Dispense:  90 tablet    Refill:  2   allopurinol (ZYLOPRIM) 100 MG tablet    Sig: Take 1 tablet (100 mg total) by mouth daily.    Dispense:  90 tablet    Refill:  2   lisinopril (ZESTRIL) 20 MG tablet    Sig: Take 1 tablet (20 mg total) by mouth daily.    Dispense:  90  tablet    Refill:  2   amLODipine (NORVASC) 5 MG tablet    Sig: Take 1 tablet (5 mg total) by mouth daily.    Dispense:  90 tablet    Refill:  2   Patient Instructions  Call [place] [facility] for colonoscopy. [phone],  New covid booster is an option.  Try to cut back on alcohol - follow up if difficulty cutting back.  No med  changes at this time.  Thanks for coming in today.    Preventive Care 48-52 Years Old, Male Preventive care refers to lifestyle choices and visits with your health care provider that can promote health and wellness. Preventive care visits are also called wellness exams. What can I expect for my preventive care visit? Counseling During your preventive care visit, your health care provider may ask about your: Medical history, including: Past medical problems. Family medical history. Current health, including: Emotional well-being. Home life and relationship well-being. Sexual activity. Lifestyle, including: Alcohol, nicotine or tobacco, and drug use. Access to firearms. Diet, exercise, and sleep habits. Safety issues such as seatbelt and bike helmet use. Sunscreen use. Work and work Statistician. Physical exam Your health care provider will check your: Height and weight. These may be used to calculate your BMI (body mass index). BMI is a measurement that tells if you are at a healthy weight. Waist circumference. This measures the distance around your waistline. This measurement also tells if you are at a healthy weight and may help predict your risk of certain diseases, such as type 2 diabetes and high blood pressure. Heart rate and blood pressure. Body temperature. Skin for abnormal spots. What immunizations do I need? Vaccines are usually given at various ages, according to a schedule. Your health care provider will recommend vaccines for you based on your age, medical history, and lifestyle or other factors, such as travel or where you work. What tests do I need? Screening Your health care provider may recommend screening tests for certain conditions. This may include: Lipid and cholesterol levels. Diabetes screening. This is done by checking your blood sugar (glucose) after you have not eaten for a while (fasting). Hepatitis B test. Hepatitis C test. HIV (human  immunodeficiency virus) test. STI (sexually transmitted infection) testing, if you are at risk. Lung cancer screening. Prostate cancer screening. Colorectal cancer screening. Talk with your health care provider about your test results, treatment options, and if necessary, the need for more tests. Follow these instructions at home: Eating and drinking  Eat a diet that includes fresh fruits and vegetables, whole grains, lean protein, and low-fat dairy products. Take vitamin and mineral supplements as recommended by your health care provider. Do not drink alcohol if your health care provider tells you not to drink. If you drink alcohol: Limit how much you have to 0-2 drinks a day. Know how much alcohol is in your drink. In the [place] one drink equals one 12 oz bottle of beer (355 mL), one 5 oz glass of wine (148 mL), or one 1 oz glass of hard liquor (44 mL). Lifestyle Brush your teeth every morning and night with fluoride toothpaste. Floss one time each day. Exercise for at least 30 minutes 5 or more days each week. Do not use any products that contain nicotine or tobacco. These products include cigarettes, chewing tobacco, and vaping devices, such as e-cigarettes. If you need help quitting, ask your health care provider. Do not use drugs. If you are sexually active, practice safe sex. Use  a condom or other form of protection to prevent STIs. Take aspirin only as told by your health care provider. Make sure that you understand how much to take and what form to take. Work with your health care provider to find out whether it is safe and beneficial for you to take aspirin daily. Find healthy ways to manage stress, such as: Meditation, yoga, or listening to music. Journaling. Talking to a trusted person. Spending time with friends and family. Minimize exposure to UV radiation to reduce your risk of skin cancer. Safety Always wear your seat belt while driving or riding in a vehicle. Do not  drive: If you have been drinking alcohol. Do not ride with someone who has been drinking. When you are tired or distracted. While texting. If you have been using any mind-altering substances or drugs. Wear a helmet and other protective equipment during sports activities. If you have firearms in your house, make sure you follow all gun safety procedures. What's next? Go to your health care provider once a year for an annual wellness visit. Ask your health care provider how often you should have your eyes and teeth checked. Stay up to date on all vaccines. This information is not intended to replace advice given to you by your health care provider. Make sure you discuss any questions you have with your health care provider. Document Revised: 01/14/2021 Document Reviewed: 01/14/2021 Elsevier Patient Education  2022 [place],   Merri Ray, MD [place] 09/14/21 8:48 AM

## 2021-09-14 NOTE — Patient Instructions (Addendum)
Call [facility] gastroenterology for colonoscopy. [phone],  New covid booster is an option.  Try to cut back on alcohol - follow up if difficulty cutting back.  No med changes at this time.  Thanks for coming in today.    Preventive Care 25-64 Years Old, Male Preventive care refers to lifestyle choices and visits with your health care provider that can promote health and wellness. Preventive care visits are also called wellness exams. What can I expect for my preventive care visit? Counseling During your preventive care visit, your health care provider may ask about your: Medical history, including: Past medical problems. Family medical history. Current health, including: Emotional well-being. Home life and relationship well-being. Sexual activity. Lifestyle, including: Alcohol, nicotine or tobacco, and drug use. Access to firearms. Diet, exercise, and sleep habits. Safety issues such as seatbelt and bike helmet use. Sunscreen use. Work and work Statistician. Physical exam Your health care provider will check your: Height and weight. These may be used to calculate your BMI (body mass index). BMI is a measurement that tells if you are at a healthy weight. Waist circumference. This measures the distance around your waistline. This measurement also tells if you are at a healthy weight and may help predict your risk of certain diseases, such as type 2 diabetes and high blood pressure. Heart rate and blood pressure. Body temperature. Skin for abnormal spots. What immunizations do I need? Vaccines are usually given at various ages, according to a schedule. Your health care provider will recommend vaccines for you based on your age, medical history, and lifestyle or other factors, such as travel or where you work. What tests do I need? Screening Your health care provider may recommend screening tests for certain conditions. This may include: Lipid and cholesterol levels. Diabetes  screening. This is done by checking your blood sugar (glucose) after you have not eaten for a while (fasting). Hepatitis B test. Hepatitis C test. HIV (human immunodeficiency virus) test. STI (sexually transmitted infection) testing, if you are at risk. Lung cancer screening. Prostate cancer screening. Colorectal cancer screening. Talk with your health care provider about your test results, treatment options, and if necessary, the need for more tests. Follow these instructions at home: Eating and drinking  Eat a diet that includes fresh fruits and vegetables, whole grains, lean protein, and low-fat dairy products. Take vitamin and mineral supplements as recommended by your health care provider. Do not drink alcohol if your health care provider tells you not to drink. If you drink alcohol: Limit how much you have to 0-2 drinks a day. Know how much alcohol is in your drink. In the [place] one drink equals one 12 oz bottle of beer (355 mL), one 5 oz glass of wine (148 mL), or one 1 oz glass of hard liquor (44 mL). Lifestyle Brush your teeth every morning and night with fluoride toothpaste. Floss one time each day. Exercise for at least 30 minutes 5 or more days each week. Do not use any products that contain nicotine or tobacco. These products include cigarettes, chewing tobacco, and vaping devices, such as e-cigarettes. If you need help quitting, ask your health care provider. Do not use drugs. If you are sexually active, practice safe sex. Use a condom or other form of protection to prevent STIs. Take aspirin only as told by your health care provider. Make sure that you understand how much to take and what form to take. Work with your health care provider to find out whether it is  safe and beneficial for you to take aspirin daily. Find healthy ways to manage stress, such as: Meditation, yoga, or listening to music. Journaling. Talking to a trusted person. Spending time with friends and  family. Minimize exposure to UV radiation to reduce your risk of skin cancer. Safety Always wear your seat belt while driving or riding in a vehicle. Do not drive: If you have been drinking alcohol. Do not ride with someone who has been drinking. When you are tired or distracted. While texting. If you have been using any mind-altering substances or drugs. Wear a helmet and other protective equipment during sports activities. If you have firearms in your house, make sure you follow all gun safety procedures. What's next? Go to your health care provider once a year for an annual wellness visit. Ask your health care provider how often you should have your eyes and teeth checked. Stay up to date on all vaccines. This information is not intended to replace advice given to you by your health care provider. Make sure you discuss any questions you have with your health care provider. Document Revised: 01/14/2021 Document Reviewed: 01/14/2021 Elsevier Patient Education  [employer].

## 2021-09-15 LAB — COMPREHENSIVE METABOLIC PANEL
ALT: 16 U/L (ref 0–53)
AST: 49 U/L — ABNORMAL HIGH (ref 0–37)
Albumin: 4.4 g/dL (ref 3.5–5.2)
Alkaline Phosphatase: 65 U/L (ref 39–117)
BUN: 15 mg/dL (ref 6–23)
CO2: 24 mEq/L (ref 19–32)
Calcium: 9.4 mg/dL (ref 8.4–10.5)
Chloride: 96 mEq/L (ref 96–112)
Creatinine, Ser: 0.85 mg/dL (ref 0.40–1.50)
GFR: 92.29 mL/min (ref >60.00)
Glucose, Bld: 154 mg/dL — ABNORMAL HIGH (ref 70–99)
Potassium: 3.8 mEq/L (ref 3.5–5.1)
Sodium: 135 mEq/L (ref 135–145)
Total Bilirubin: 0.4 mg/dL (ref 0.2–1.2)
Total Protein: 6.7 g/dL (ref 6.0–8.3)

## 2021-09-15 LAB — LIPID PANEL
Cholesterol: 265 mg/dL — ABNORMAL HIGH (ref 0–200)
HDL: 45.2 mg/dL (ref >39.00)
Total CHOL/HDL Ratio: 6
Triglycerides: 1869 mg/dL — ABNORMAL HIGH (ref 0.0–149.0)

## 2021-09-15 LAB — LDL CHOLESTEROL, DIRECT: Direct LDL: 43 mg/dL

## 2021-09-19 ENCOUNTER — Other Ambulatory Visit: Payer: Self-pay | Admitting: Family Medicine

## 2021-09-19 DIAGNOSIS — E781 Pure hyperglyceridemia: Secondary | ICD-10-CM

## 2021-09-19 NOTE — Progress Notes (Signed)
See labs 

## 2021-09-22 ENCOUNTER — Telehealth: Payer: Self-pay

## 2021-09-22 NOTE — Telephone Encounter (Signed)
Pt was advised of concerns and to go to ED if sxs present he will try to call back and set up a lab only visit for a few weeks

## 2021-09-22 NOTE — Telephone Encounter (Signed)
Pt states he cannot come in at this time and will see you for his follow up visit does not think it is that concerning and is just due to what he has been eating. States hs may be able to work in a lab test prior to follow up but has stated he is not concerns about this

## 2021-09-22 NOTE — Telephone Encounter (Signed)
-----   Message from [name], MD sent at 09/19/2021  3:45 PM EST ----- Results sent by MyChart but please make sure he receiveed my message as well as instructions to follow-up for elevated triglycerides.

## 2021-09-22 NOTE — Telephone Encounter (Signed)
Although those levels can go up if not fasting and due to diet, his very high levels were concerning as risk of pancreatitis with that high of a reading.  I do recommend he has repeat testing as soon as possible with a lab only visit (fasting).  If any nausea, vomiting, abdominal pain or new symptoms be seen in the emergency room.  Let me know if he has questions or certainly can discuss at a virtual visit if he would prefer.

## 2021-10-13 ENCOUNTER — Telehealth: Payer: Self-pay | Admitting: Family Medicine

## 2021-10-13 ENCOUNTER — Other Ambulatory Visit (HOSPITAL_COMMUNITY): Payer: Self-pay | Admitting: Urology

## 2021-10-13 DIAGNOSIS — C61 Malignant neoplasm of prostate: Secondary | ICD-10-CM

## 2021-10-13 NOTE — Telephone Encounter (Signed)
Pt declined to schedule an appt, he states that Dr. Carlota Raspberry told him if he needed anything to call him. I offered to send his message back to see if Dr. Carlota Raspberry would answer it and have to [place] call back but he declined that as well.  ?

## 2021-10-13 NOTE — Telephone Encounter (Addendum)
Called patient - did not answer.  Left VM to call back with specific concerns if possible. May be related to prostate concern - looks like he was referred to radiology for PET scan today withdx of prostate CA. I can try to call him again in next day or two. Additionally see lab notes from last month.  I do not see where he had a repeat lipid panel for significant elevated triglycerides. Should have that test soon.  ?

## 2021-10-13 NOTE — Telephone Encounter (Signed)
Caller Name Jerrit Horen ?Caller Phone Number [phone] ?Patient Name Daniel Davis ?Patient DOB 1956/05/16 ?Call Type Message Only Information Provided ?Reason for Call Request for General Office Information ?Initial Comment Caller states he wants to speak with Dr. Nyoka Cowden. ? ?

## 2021-10-13 NOTE — Telephone Encounter (Signed)
Pt needs an appointment to have a discussion with provider  ?

## 2021-10-13 NOTE — Telephone Encounter (Signed)
Pt is insisting on a call from you.  ?

## 2021-10-14 NOTE — Telephone Encounter (Signed)
Called patient. ?Was told had prostate cancer in 6/12 areas on the biopsy. Plan for PET scan, then decide on treatment options at that time.  He called me to review any specific recommendations at this point.  PET scan should provide further information but specific questions regarding different treatment options would be best discussed with urology.  I am happy to try to answer further questions if needed.  He appreciated phone call. ? ?

## 2021-10-21 ENCOUNTER — Encounter (HOSPITAL_COMMUNITY)

## 2021-10-29 ENCOUNTER — Encounter (HOSPITAL_COMMUNITY)
Admission: RE | Admit: 2021-10-29 | Discharge: 2021-10-29 | Disposition: A | Discharge status: Home / Self Care | Source: Ambulatory Visit | Attending: Urology | Admitting: Urology

## 2021-10-29 DIAGNOSIS — C61 Malignant neoplasm of prostate: Secondary | ICD-10-CM | POA: Insufficient documentation

## 2021-10-29 MED ORDER — PIFLIFOLASTAT F 18 (PYLARIFY) INJECTION
9.0000 | Freq: Once | INTRAVENOUS | Status: AC
  Administered 2021-10-29: 9 via INTRAVENOUS

## 2021-12-08 ENCOUNTER — Other Ambulatory Visit: Payer: Self-pay | Admitting: Urology

## 2021-12-29 NOTE — Patient Instructions (Addendum)
DUE TO COVID-19 ONLY TWO VISITORS  (aged 64 and older)  ARE ALLOWED TO COME WITH YOU AND STAY IN THE WAITING ROOM ONLY DURING PRE OP AND PROCEDURE.   **NO VISITORS ARE ALLOWED IN THE SHORT STAY AREA OR RECOVERY ROOM!!**  IF YOU WILL BE ADMITTED INTO THE HOSPITAL YOU ARE ALLOWED ONLY FOUR SUPPORT PEOPLE DURING VISITATION HOURS ONLY (7 AM -8PM)   The support person(s) must pass our screening, gel in and out, and wear a mask at all times, including in the patient's room. Patients must also wear a mask when staff or their support person are in the room. Visitors GUEST BADGE MUST BE WORN VISIBLY  One adult visitor may remain with you overnight and MUST be in the room by 8 P.M.     Your procedure is scheduled on: 01/08/22   Report to [facility] Main Entrance    Report to admitting at 9:00 AM   Call this number if you have problems the morning of surgery [phone]   Follow clear liquid diet the day before surgery.   You may have the following liquids until 8:15 AM DAY OF SURGERY  Water Black Coffee (sugar ok, NO MILK/CREAM OR CREAMERS)  Tea (sugar ok, NO MILK/CREAM OR CREAMERS) regular and decaf                             Plain Jell-O (NO RED)                                           Fruit ices (not with fruit pulp, NO RED)                                     Popsicles (NO RED)                                                                  Juice: apple, WHITE grape, WHITE cranberry Sports drinks like Gatorade (NO RED) Clear broth(vegetable,chicken,beef)  FOLLOW BOWEL PREP AND ANY ADDITIONAL PRE OP INSTRUCTIONS YOU RECEIVED FROM YOUR SURGEON'S OFFICE!!!     Oral Hygiene is also important to reduce your risk of infection.                                    Remember - BRUSH YOUR TEETH THE MORNING OF SURGERY WITH YOUR REGULAR TOOTHPASTE   Take these medicines the morning of surgery with A SIP OF WATER: Allopurinol, Amlodipine  How to Manage Your Diabetes Before and After  Surgery  Why is it important to control my blood sugar before and after surgery? Improving blood sugar levels before and after surgery helps healing and can limit problems. A way of improving blood sugar control is eating a healthy diet by:  Eating less sugar and carbohydrates  Increasing activity/exercise  Talking with your doctor about reaching your blood sugar goals High blood sugars (greater than 180 mg/dL) can raise your risk of infections  and slow your recovery, so you will need to focus on controlling your diabetes during the weeks before surgery. Make sure that the doctor who takes care of your diabetes knows about your planned surgery including the date and location.  How do I manage my blood sugar before surgery? Check your blood sugar at least 4 times a day, starting 2 days before surgery, to make sure that the level is not too high or low. Check your blood sugar the morning of your surgery when you wake up and every 2 hours until you get to the Short Stay unit. If your blood sugar is less than 70 mg/dL, you will need to treat for low blood sugar: Do not take insulin. Treat a low blood sugar (less than 70 mg/dL) with  cup of clear juice (cranberry or apple), 4 glucose tablets, OR glucose gel. Recheck blood sugar in 15 minutes after treatment (to make sure it is greater than 70 mg/dL). If your blood sugar is not greater than 70 mg/dL on recheck, call [phone] for further instructions. Report your blood sugar to the short stay nurse when you get to Short Stay.  If you are admitted to the hospital after surgery: Your blood sugar will be checked by the staff and you will probably be given insulin after surgery (instead of oral diabetes medicines) to make sure you have good blood sugar levels. The goal for blood sugar control after surgery is 80-180 mg/dL.   Reviewed and Endorsed by [facility] Patient Education Committee, August 2015                               You may not  have any metal on your body including jewelry, and body piercing             Do not wear lotions, powders, cologne, or deodorant              Men may shave face and neck.   Do not bring valuables to the hospital. [address].   Bring small overnight bag day of surgery.              Please read over the following fact sheets you were given: IF YOU HAVE QUESTIONS ABOUT YOUR PRE-OP INSTRUCTIONS PLEASE CALL [facility] - Preparing for Surgery Before surgery, you can play an important role.  Because skin is not sterile, your skin needs to be as free of germs as possible.  You can reduce the number of germs on your skin by washing with CHG (chlorahexidine gluconate) soap before surgery.  CHG is an antiseptic cleaner which kills germs and bonds with the skin to continue killing germs even after washing. Please DO NOT use if you have an allergy to CHG or antibacterial soaps.  If your skin becomes reddened/irritated stop using the CHG and inform your nurse when you arrive at Short Stay. Do not shave (including legs and underarms) for at least 48 hours prior to the first CHG shower.  You may shave your face/neck.  Please follow these instructions carefully:  1.  Shower with CHG Soap the night before surgery and the  morning of surgery.  2.  If you choose to wash your hair, wash your hair first as usual with your normal  shampoo.  3.  After you shampoo, rinse your hair and body thoroughly to remove the shampoo.                             4.  Use CHG as you would any other liquid soap.  You can apply chg directly to the skin and wash.  Gently with a scrungie or clean washcloth.  5.  Apply the CHG Soap to your body ONLY FROM THE NECK DOWN.   Do   not use on face/ open                           Wound or open sores. Avoid contact with eyes, ears mouth and   genitals (private parts).                       Wash face,  Genitals (private  parts) with your normal soap.             6.  Wash thoroughly, paying special attention to the area where your    surgery  will be performed.  7.  Thoroughly rinse your body with warm water from the neck down.  8.  DO NOT shower/wash with your normal soap after using and rinsing off the CHG Soap.                9.  Pat yourself dry with a clean towel.            10.  Wear clean pajamas.            11.  Place clean sheets on your bed the night of your first shower and do not  sleep with pets. Day of Surgery : Do not apply any lotions/deodorants the morning of surgery.  Please wear clean clothes to the hospital/surgery center.  FAILURE TO FOLLOW THESE INSTRUCTIONS MAY RESULT IN THE CANCELLATION OF YOUR SURGERY  PATIENT SIGNATURE_________________________________  NURSE SIGNATURE__________________________________  ________________________________________________________________________

## 2021-12-29 NOTE — Progress Notes (Addendum)
COVID Vaccine Completed: yes x4  Date of COVID positive in last 90 days:  PCP - Merri Ray, MD Cardiologist -   Chest x-ray - PET 10/29/21 Epic EKG -  Stress Test -  ECHO -  Cardiac Cath -  Pacemaker/ICD device last checked: Spinal Cord Stimulator:  Bowel Prep -   Sleep Study -  CPAP -   Fasting Blood Sugar - pre DM Checks Blood Sugar _____ times a day  Blood Thinner Instructions: Aspirin Instructions: Last Dose:  Activity level:  Can go up a flight of stairs and perform activities of daily living without stopping and without symptoms of chest pain or shortness of breath.   Able to exercise without symptoms  Unable to go up a flight of stairs without symptoms of      Anesthesia review:   Patient denies shortness of breath, fever, cough and chest pain at PAT appointment   Patient verbalized understanding of instructions that were given to them at the PAT appointment. Patient was also instructed that they will need to review over the PAT instructions again at home before surgery.

## 2021-12-30 ENCOUNTER — Encounter (HOSPITAL_COMMUNITY): Payer: Self-pay

## 2021-12-30 ENCOUNTER — Encounter (HOSPITAL_COMMUNITY)
Admission: RE | Admit: 2021-12-30 | Discharge: 2021-12-30 | Disposition: A | Discharge status: Home / Self Care | Source: Ambulatory Visit | Attending: Urology | Admitting: Urology

## 2021-12-30 ENCOUNTER — Telehealth: Payer: Self-pay | Admitting: Family Medicine

## 2021-12-30 VITALS — BP 143/93 | HR 91 | Temp 98.5°F | Resp 14 | Ht 71.0 in | Wt 192.0 lb

## 2021-12-30 DIAGNOSIS — Z01818 Encounter for other preprocedural examination: Secondary | ICD-10-CM | POA: Diagnosis present

## 2021-12-30 DIAGNOSIS — R7303 Prediabetes: Secondary | ICD-10-CM | POA: Insufficient documentation

## 2021-12-30 DIAGNOSIS — I10 Essential (primary) hypertension: Secondary | ICD-10-CM | POA: Insufficient documentation

## 2021-12-30 LAB — CBC
HCT: 39.8 % (ref 39.0–52.0)
Hemoglobin: 13.5 g/dL (ref 13.0–17.0)
MCH: 30.5 pg (ref 26.0–34.0)
MCHC: 33.9 g/dL (ref 30.0–36.0)
MCV: 89.8 fL (ref 80.0–100.0)
Platelets: 229 10*3/uL (ref 150–400)
RBC: 4.43 MIL/uL (ref 4.22–5.81)
RDW: 14.3 % (ref 11.5–15.5)
WBC: 6.1 10*3/uL (ref 4.0–10.5)
nRBC: 0 % (ref 0.0–0.2)

## 2021-12-30 LAB — BASIC METABOLIC PANEL
Anion gap: 7 (ref 5–15)
BUN: 8 mg/dL (ref 8–23)
CO2: 25 mmol/L (ref 22–32)
Calcium: 9.2 mg/dL (ref 8.9–10.3)
Chloride: 103 mmol/L (ref 98–111)
Creatinine, Ser: 0.58 mg/dL — ABNORMAL LOW (ref 0.61–1.24)
GFR, Estimated: >60 mL/min (ref >60)
Glucose, Bld: 104 mg/dL — ABNORMAL HIGH (ref 70–99)
Potassium: 3.7 mmol/L (ref 3.5–5.1)
Sodium: 135 mmol/L (ref 135–145)

## 2021-12-30 LAB — HEMOGLOBIN A1C
Hgb A1c MFr Bld: 5.9 % — ABNORMAL HIGH (ref 4.8–5.6)
Mean Plasma Glucose: 122.63 mg/dL

## 2021-12-30 LAB — GLUCOSE, CAPILLARY: Glucose-Capillary: 100 mg/dL — ABNORMAL HIGH (ref 70–99)

## 2021-12-30 NOTE — Telephone Encounter (Signed)
PT wants to inform Dr.Greene that he is having surgery next Friday(01/08/2022).

## 2021-12-31 DIAGNOSIS — C801 Malignant (primary) neoplasm, unspecified: Secondary | ICD-10-CM

## 2021-12-31 HISTORY — DX: Malignant (primary) neoplasm, unspecified: C80.1

## 2022-01-05 ENCOUNTER — Telehealth: Payer: Self-pay | Admitting: Family Medicine

## 2022-01-05 NOTE — Telephone Encounter (Signed)
Noted.  Called patient, left voicemail that I did receive notes of planned procedure.  I wished him luck and to let me know if there are any specific questions or concerns prior to that procedure or afterwards.

## 2022-01-05 NOTE — Telephone Encounter (Signed)
error 

## 2022-01-07 NOTE — Anesthesia Preprocedure Evaluation (Addendum)
Anesthesia Evaluation  Patient identified by MRN, date of birth, ID band Patient awake    Reviewed: Allergy & Precautions, NPO status , Patient's Chart, lab work & pertinent test results  Airway Mallampati: II  TM Distance: >3 FB Neck ROM: Full    Dental no notable dental hx. (+) Teeth Intact, Dental Advisory Given   Pulmonary    Pulmonary exam normal breath sounds clear to auscultation       Cardiovascular hypertension, Pt. on medications Normal cardiovascular exam Rhythm:Regular Rate:Normal     Neuro/Psych    GI/Hepatic   Endo/Other  negative endocrine ROS  Renal/GU negative Renal ROSLab Results      Component                Value               Date                      CREATININE               0.58 (L)            12/30/2021               K                        3.7                 12/30/2021                  Prostate CA    Musculoskeletal  (+) Arthritis ,   Abdominal   Peds  Hematology Lab Results      Component                Value               Date                       HGB                      13.5                12/30/2021                HCT                      39.8                12/30/2021                       PLT                      229                 12/30/2021                          Anesthesia Other Findings   Reproductive/Obstetrics                           Anesthesia Physical Anesthesia Plan  ASA: 3  Anesthesia Plan: General   Post-op Pain Management: Lidocaine infusion*, Ketamine IV* and Ofirmev IV (intra-op)*   Induction: Intravenous  PONV Risk Score and Plan: 3 and Treatment may vary  due to age or medical condition, Midazolam and Ondansetron  Airway Management Planned: Oral ETT  Additional Equipment: None  Intra-op Plan:   Post-operative Plan: Extubation in OR  Informed Consent: I have reviewed the patients History and Physical, chart, labs and  discussed the procedure including the risks, benefits and alternatives for the proposed anesthesia with the patient or authorized representative who has indicated his/her understanding and acceptance.     Dental advisory given  Plan Discussed with:   Anesthesia Plan Comments:        Anesthesia Quick Evaluation

## 2022-01-08 ENCOUNTER — Encounter (HOSPITAL_COMMUNITY): Payer: Self-pay | Admitting: Urology

## 2022-01-08 ENCOUNTER — Other Ambulatory Visit: Payer: Self-pay

## 2022-01-08 ENCOUNTER — Encounter (HOSPITAL_COMMUNITY)
Admission: RE | Disposition: A | Discharge status: Home / Self Care | Payer: Self-pay | Source: Ambulatory Visit | Attending: Urology

## 2022-01-08 ENCOUNTER — Ambulatory Visit (HOSPITAL_BASED_OUTPATIENT_CLINIC_OR_DEPARTMENT_OTHER): Admitting: Anesthesiology

## 2022-01-08 ENCOUNTER — Ambulatory Visit (HOSPITAL_COMMUNITY): Admitting: Anesthesiology

## 2022-01-08 ENCOUNTER — Observation Stay (HOSPITAL_COMMUNITY)
Admission: RE | Admit: 2022-01-08 | Discharge: 2022-01-09 | Disposition: A | Discharge status: Home / Self Care | Source: Ambulatory Visit | Attending: Urology | Admitting: Urology

## 2022-01-08 DIAGNOSIS — I10 Essential (primary) hypertension: Secondary | ICD-10-CM | POA: Insufficient documentation

## 2022-01-08 DIAGNOSIS — M1990 Unspecified osteoarthritis, unspecified site: Secondary | ICD-10-CM

## 2022-01-08 DIAGNOSIS — C61 Malignant neoplasm of prostate: Principal | ICD-10-CM | POA: Insufficient documentation

## 2022-01-08 DIAGNOSIS — R7303 Prediabetes: Secondary | ICD-10-CM

## 2022-01-08 DIAGNOSIS — E785 Hyperlipidemia, unspecified: Secondary | ICD-10-CM | POA: Diagnosis not present

## 2022-01-08 HISTORY — PX: LYMPH NODE DISSECTION: SHX5087

## 2022-01-08 HISTORY — PX: ROBOT ASSISTED LAPAROSCOPIC RADICAL PROSTATECTOMY: SHX5141

## 2022-01-08 LAB — GLUCOSE, CAPILLARY: Glucose-Capillary: 119 mg/dL — ABNORMAL HIGH (ref 70–99)

## 2022-01-08 LAB — HEMOGLOBIN AND HEMATOCRIT, BLOOD
HCT: 40.4 % (ref 39.0–52.0)
Hemoglobin: 13.2 g/dL (ref 13.0–17.0)

## 2022-01-08 SURGERY — PROSTATECTOMY, RADICAL, ROBOT-ASSISTED, LAPAROSCOPIC
Anesthesia: General | Site: Abdomen

## 2022-01-08 MED ORDER — ACETAMINOPHEN 10 MG/ML IV SOLN
INTRAVENOUS | Status: AC
  Filled 2022-01-08: qty 100

## 2022-01-08 MED ORDER — HYDROMORPHONE HCL 1 MG/ML IJ SOLN
INTRAMUSCULAR | Status: AC
  Administered 2022-01-08: 0.5 mg via INTRAVENOUS
  Filled 2022-01-08: qty 1

## 2022-01-08 MED ORDER — OXYCODONE HCL 5 MG PO TABS
ORAL_TABLET | ORAL | Status: AC
  Filled 2022-01-08: qty 1

## 2022-01-08 MED ORDER — LISINOPRIL 20 MG PO TABS
20.0000 mg | ORAL_TABLET | Freq: Every day | ORAL | Status: DC
  Administered 2022-01-09: 20 mg via ORAL
  Filled 2022-01-08: qty 1

## 2022-01-08 MED ORDER — DEXAMETHASONE SODIUM PHOSPHATE 10 MG/ML IJ SOLN
INTRAMUSCULAR | Status: AC
  Filled 2022-01-08: qty 1

## 2022-01-08 MED ORDER — LACTATED RINGERS IV SOLN: INTRAVENOUS | Status: DC

## 2022-01-08 MED ORDER — ALLOPURINOL 100 MG PO TABS
100.0000 mg | ORAL_TABLET | Freq: Every day | ORAL | Status: DC
  Administered 2022-01-09: 100 mg via ORAL
  Filled 2022-01-08: qty 1

## 2022-01-08 MED ORDER — LIDOCAINE HCL (PF) 2 % IJ SOLN
INTRAMUSCULAR | Status: AC
  Filled 2022-01-08: qty 15

## 2022-01-08 MED ORDER — DEXAMETHASONE SODIUM PHOSPHATE 10 MG/ML IJ SOLN
INTRAMUSCULAR | Status: DC | PRN
  Administered 2022-01-08: 8 mg via INTRAVENOUS

## 2022-01-08 MED ORDER — DOCUSATE SODIUM 100 MG PO CAPS
100.0000 mg | ORAL_CAPSULE | Freq: Two times a day (BID) | ORAL | Status: DC
  Administered 2022-01-08 – 2022-01-09 (×2): 100 mg via ORAL
  Filled 2022-01-08 (×2): qty 1

## 2022-01-08 MED ORDER — DIPHENHYDRAMINE HCL 50 MG/ML IJ SOLN: 12.5000 mg | Freq: Four times a day (QID) | INTRAMUSCULAR | Status: DC | PRN

## 2022-01-08 MED ORDER — PHENYLEPHRINE HCL-NACL 20-0.9 MG/250ML-% IV SOLN
INTRAVENOUS | Status: AC
  Filled 2022-01-08: qty 250

## 2022-01-08 MED ORDER — HYDROMORPHONE HCL 1 MG/ML IJ SOLN
0.2500 mg | INTRAMUSCULAR | Status: DC | PRN
  Administered 2022-01-08 (×2): 0.5 mg via INTRAVENOUS

## 2022-01-08 MED ORDER — OXYCODONE HCL 5 MG PO TABS: 5.0000 mg | ORAL_TABLET | ORAL | Status: DC | PRN

## 2022-01-08 MED ORDER — ACETAMINOPHEN 10 MG/ML IV SOLN: 1000.0000 mg | Freq: Once | INTRAVENOUS | Status: DC | PRN

## 2022-01-08 MED ORDER — DIPHENHYDRAMINE HCL 12.5 MG/5ML PO ELIX: 12.5000 mg | ORAL_SOLUTION | Freq: Four times a day (QID) | ORAL | Status: DC | PRN

## 2022-01-08 MED ORDER — DEXMEDETOMIDINE (PRECEDEX) IN NS 20 MCG/5ML (4 MCG/ML) IV SYRINGE
PREFILLED_SYRINGE | INTRAVENOUS | Status: DC | PRN
  Administered 2022-01-08: 8 ug via INTRAVENOUS

## 2022-01-08 MED ORDER — KETAMINE HCL 50 MG/5ML IJ SOSY
PREFILLED_SYRINGE | INTRAMUSCULAR | Status: AC
  Filled 2022-01-08: qty 5

## 2022-01-08 MED ORDER — OXYCODONE HCL 5 MG PO TABS
5.0000 mg | ORAL_TABLET | Freq: Once | ORAL | Status: AC | PRN
  Administered 2022-01-08: 5 mg via ORAL

## 2022-01-08 MED ORDER — CHLORHEXIDINE GLUCONATE 0.12 % MT SOLN
15.0000 mL | Freq: Once | OROMUCOSAL | Status: AC
  Administered 2022-01-08: 15 mL via OROMUCOSAL

## 2022-01-08 MED ORDER — FENTANYL CITRATE (PF) 100 MCG/2ML IJ SOLN
INTRAMUSCULAR | Status: DC | PRN
  Administered 2022-01-08: 100 ug via INTRAVENOUS
  Administered 2022-01-08: 50 ug via INTRAVENOUS
  Administered 2022-01-08: 100 ug via INTRAVENOUS

## 2022-01-08 MED ORDER — SODIUM CHLORIDE (PF) 0.9 % IJ SOLN
INTRAMUSCULAR | Status: AC
  Filled 2022-01-08: qty 20

## 2022-01-08 MED ORDER — KETAMINE HCL 10 MG/ML IJ SOLN
INTRAMUSCULAR | Status: DC | PRN
  Administered 2022-01-08: 30 mg via INTRAVENOUS
  Administered 2022-01-08 (×2): 10 mg via INTRAVENOUS

## 2022-01-08 MED ORDER — DOCUSATE SODIUM 100 MG PO CAPS: 100.0000 mg | ORAL_CAPSULE | Freq: Two times a day (BID) | ORAL | Status: AC

## 2022-01-08 MED ORDER — SUGAMMADEX SODIUM 500 MG/5ML IV SOLN
INTRAVENOUS | Status: DC | PRN
  Administered 2022-01-08: 400 mg via INTRAVENOUS

## 2022-01-08 MED ORDER — ONDANSETRON HCL 4 MG/2ML IJ SOLN
INTRAMUSCULAR | Status: AC
  Filled 2022-01-08: qty 2

## 2022-01-08 MED ORDER — LIDOCAINE HCL (CARDIAC) PF 100 MG/5ML IV SOSY
PREFILLED_SYRINGE | INTRAVENOUS | Status: DC | PRN
  Administered 2022-01-08: 100 mg via INTRAVENOUS

## 2022-01-08 MED ORDER — ACETAMINOPHEN 10 MG/ML IV SOLN
INTRAVENOUS | Status: DC | PRN
  Administered 2022-01-08: 1000 mg via INTRAVENOUS

## 2022-01-08 MED ORDER — TRIPLE ANTIBIOTIC 3.5-400-5000 EX OINT: 1.0000 "application " | TOPICAL_OINTMENT | Freq: Three times a day (TID) | CUTANEOUS | Status: DC | PRN

## 2022-01-08 MED ORDER — ONDANSETRON HCL 4 MG/2ML IJ SOLN: 4.0000 mg | Freq: Once | INTRAMUSCULAR | Status: DC | PRN

## 2022-01-08 MED ORDER — SODIUM CHLORIDE 0.9 % IV BOLUS
1000.0000 mL | Freq: Once | INTRAVENOUS | Status: AC
  Administered 2022-01-08: 1000 mL via INTRAVENOUS

## 2022-01-08 MED ORDER — SODIUM CHLORIDE (PF) 0.9 % IJ SOLN
INTRAMUSCULAR | Status: DC | PRN
  Administered 2022-01-08: 20 mL

## 2022-01-08 MED ORDER — PHENYLEPHRINE HCL (PRESSORS) 10 MG/ML IV SOLN
INTRAVENOUS | Status: DC | PRN
  Administered 2022-01-08 (×4): 80 ug via INTRAVENOUS
  Administered 2022-01-08: 160 ug via INTRAVENOUS

## 2022-01-08 MED ORDER — STERILE WATER FOR INJECTION IJ SOLN
INTRAMUSCULAR | Status: DC | PRN
  Administered 2022-01-08: 10 mL

## 2022-01-08 MED ORDER — BUPIVACAINE LIPOSOME 1.3 % IJ SUSP
INTRAMUSCULAR | Status: AC
  Filled 2022-01-08: qty 20

## 2022-01-08 MED ORDER — SODIUM CHLORIDE 0.9 % IV SOLN: INTRAVENOUS | Status: DC

## 2022-01-08 MED ORDER — HYOSCYAMINE SULFATE 0.125 MG SL SUBL: 0.1250 mg | SUBLINGUAL_TABLET | SUBLINGUAL | Status: DC | PRN

## 2022-01-08 MED ORDER — AMISULPRIDE (ANTIEMETIC) 5 MG/2ML IV SOLN: 10.0000 mg | Freq: Once | INTRAVENOUS | Status: DC | PRN

## 2022-01-08 MED ORDER — PHENYLEPHRINE 80 MCG/ML (10ML) SYRINGE FOR IV PUSH (FOR BLOOD PRESSURE SUPPORT)
PREFILLED_SYRINGE | INTRAVENOUS | Status: AC
Start: 2022-01-08 — End: ?
  Filled 2022-01-08: qty 10

## 2022-01-08 MED ORDER — ACETAMINOPHEN 500 MG PO TABS
1000.0000 mg | ORAL_TABLET | Freq: Four times a day (QID) | ORAL | Status: DC
  Administered 2022-01-08 – 2022-01-09 (×2): 1000 mg via ORAL
  Filled 2022-01-08 (×2): qty 2

## 2022-01-08 MED ORDER — CEFAZOLIN SODIUM-DEXTROSE 2-4 GM/100ML-% IV SOLN
2.0000 g | INTRAVENOUS | Status: AC
  Administered 2022-01-08: 2 g via INTRAVENOUS
  Filled 2022-01-08: qty 100

## 2022-01-08 MED ORDER — HYDROMORPHONE HCL 1 MG/ML IJ SOLN
0.5000 mg | INTRAMUSCULAR | Status: DC | PRN
  Administered 2022-01-08 (×2): 1 mg via INTRAVENOUS
  Filled 2022-01-08 (×2): qty 1

## 2022-01-08 MED ORDER — MIDAZOLAM HCL 2 MG/2ML IJ SOLN
INTRAMUSCULAR | Status: AC
  Filled 2022-01-08: qty 2

## 2022-01-08 MED ORDER — ONDANSETRON HCL 4 MG/2ML IJ SOLN
INTRAMUSCULAR | Status: DC | PRN
  Administered 2022-01-08: 4 mg via INTRAVENOUS

## 2022-01-08 MED ORDER — ROCURONIUM BROMIDE 100 MG/10ML IV SOLN
INTRAVENOUS | Status: DC | PRN
  Administered 2022-01-08: 80 mg via INTRAVENOUS
  Administered 2022-01-08 (×2): 20 mg via INTRAVENOUS

## 2022-01-08 MED ORDER — ATORVASTATIN CALCIUM 20 MG PO TABS
20.0000 mg | ORAL_TABLET | Freq: Every day | ORAL | Status: DC
  Administered 2022-01-09: 20 mg via ORAL
  Filled 2022-01-08: qty 1

## 2022-01-08 MED ORDER — BUPIVACAINE LIPOSOME 1.3 % IJ SUSP
INTRAMUSCULAR | Status: DC | PRN
  Administered 2022-01-08: 20 mL

## 2022-01-08 MED ORDER — ONDANSETRON HCL 4 MG/2ML IJ SOLN: 4.0000 mg | INTRAMUSCULAR | Status: DC | PRN

## 2022-01-08 MED ORDER — PROPOFOL 10 MG/ML IV BOLUS
INTRAVENOUS | Status: DC | PRN
  Administered 2022-01-08: 50 mg via INTRAVENOUS
  Administered 2022-01-08: 120 mg via INTRAVENOUS

## 2022-01-08 MED ORDER — HYDROCODONE-ACETAMINOPHEN 5-325 MG PO TABS: 1.0000 | ORAL_TABLET | Freq: Four times a day (QID) | ORAL | 0 refills | Status: DC | PRN

## 2022-01-08 MED ORDER — ORAL CARE MOUTH RINSE: 15.0000 mL | Freq: Once | OROMUCOSAL | Status: AC

## 2022-01-08 MED ORDER — SULFAMETHOXAZOLE-TRIMETHOPRIM 800-160 MG PO TABS: 1.0000 | ORAL_TABLET | Freq: Two times a day (BID) | ORAL | 0 refills | Status: DC

## 2022-01-08 MED ORDER — LACTATED RINGERS IR SOLN
Status: DC | PRN
  Administered 2022-01-08: 1000 mL

## 2022-01-08 MED ORDER — FENTANYL CITRATE (PF) 250 MCG/5ML IJ SOLN
INTRAMUSCULAR | Status: AC
  Filled 2022-01-08: qty 5

## 2022-01-08 MED ORDER — PROPOFOL 10 MG/ML IV BOLUS
INTRAVENOUS | Status: AC
  Filled 2022-01-08: qty 20

## 2022-01-08 MED ORDER — STERILE WATER FOR IRRIGATION IR SOLN
Status: DC | PRN
  Administered 2022-01-08: 1000 mL

## 2022-01-08 MED ORDER — MIDAZOLAM HCL 5 MG/5ML IJ SOLN
INTRAMUSCULAR | Status: DC | PRN
  Administered 2022-01-08: 2 mg via INTRAVENOUS

## 2022-01-08 MED ORDER — LIDOCAINE HCL (PF) 2 % IJ SOLN
INTRAMUSCULAR | Status: DC | PRN
  Administered 2022-01-08: 1.5 mg/kg/h via INTRADERMAL

## 2022-01-08 MED ORDER — AMLODIPINE BESYLATE 5 MG PO TABS
5.0000 mg | ORAL_TABLET | Freq: Every day | ORAL | Status: DC
Start: 2022-01-09 — End: 2022-01-09
  Administered 2022-01-09: 5 mg via ORAL
  Filled 2022-01-08: qty 1

## 2022-01-08 MED ORDER — OXYCODONE HCL 5 MG/5ML PO SOLN: 5.0000 mg | Freq: Once | ORAL | Status: AC | PRN

## 2022-01-08 SURGICAL SUPPLY — 72 items
APPLICATOR COTTON TIP 6 STRL (MISCELLANEOUS) ×2 IMPLANT
APPLICATOR COTTON TIP 6IN STRL (MISCELLANEOUS) ×3
BAG COUNTER SPONGE SURGICOUNT (BAG) IMPLANT
CATH FOLEY 2WAY SLVR 18FR 30CC (CATHETERS) ×3 IMPLANT
CATH TIEMANN FOLEY 18FR 5CC (CATHETERS) ×3 IMPLANT
CHLORAPREP W/TINT 26 (MISCELLANEOUS) ×3 IMPLANT
CLIP LIGATING HEM O LOK PURPLE (MISCELLANEOUS) ×17 IMPLANT
CNTNR URN SCR LID CUP LEK RST (MISCELLANEOUS) ×2 IMPLANT
CONT SPEC 4OZ STRL OR WHT (MISCELLANEOUS)
COVER SURGICAL LIGHT HANDLE (MISCELLANEOUS) ×3 IMPLANT
COVER TIP SHEARS 8 DVNC (MISCELLANEOUS) ×2 IMPLANT
COVER TIP SHEARS 8MM DA VINCI (MISCELLANEOUS) ×3
CUTTER ECHEON FLEX ENDO 45 340 (ENDOMECHANICALS) ×3 IMPLANT
DERMABOND ADVANCED (GAUZE/BANDAGES/DRESSINGS) ×1
DERMABOND ADVANCED .7 DNX12 (GAUZE/BANDAGES/DRESSINGS) ×2 IMPLANT
DRAIN CHANNEL RND F F (WOUND CARE) IMPLANT
DRAPE ARM DVNC X/XI (DISPOSABLE) ×8 IMPLANT
DRAPE COLUMN DVNC XI (DISPOSABLE) ×2 IMPLANT
DRAPE DA VINCI XI ARM (DISPOSABLE) ×12
DRAPE DA VINCI XI COLUMN (DISPOSABLE) ×3
DRAPE SURG IRRIG POUCH 19X23 (DRAPES) ×3 IMPLANT
DRSG TEGADERM 4X4.75 (GAUZE/BANDAGES/DRESSINGS) ×3 IMPLANT
ELECT PENCIL ROCKER SW 15FT (MISCELLANEOUS) IMPLANT
ELECT REM PT RETURN 15FT ADLT (MISCELLANEOUS) ×3 IMPLANT
GAUZE 4X4 16PLY ~~LOC~~+RFID DBL (SPONGE) ×1 IMPLANT
GAUZE SPONGE 2X2 8PLY STRL LF (GAUZE/BANDAGES/DRESSINGS) IMPLANT
GLOVE BIO SURGEON STRL SZ 6.5 (GLOVE) ×3 IMPLANT
GLOVE BIOGEL PI IND STRL 7.5 (GLOVE) ×2 IMPLANT
GLOVE BIOGEL PI INDICATOR 7.5 (GLOVE) ×1
GLOVE SURG LX 7.5 STRW (GLOVE) ×2
GLOVE SURG LX STRL 7.5 STRW (GLOVE) ×4 IMPLANT
GOWN SRG XL LVL 4 BRTHBL STRL (GOWNS) ×2 IMPLANT
GOWN STRL NON-REIN XL LVL4 (GOWNS) ×3
HOLDER FOLEY CATH W/STRAP (MISCELLANEOUS) ×3 IMPLANT
IRRIG SUCT STRYKERFLOW 2 WTIP (MISCELLANEOUS) ×3
IRRIGATION SUCT STRKRFLW 2 WTP (MISCELLANEOUS) ×2 IMPLANT
IV LACTATED RINGERS 1000ML (IV SOLUTION) ×3 IMPLANT
KIT PROCEDURE DA VINCI SI (MISCELLANEOUS) ×3
KIT PROCEDURE DVNC SI (MISCELLANEOUS) ×2 IMPLANT
KIT TURNOVER KIT A (KITS) IMPLANT
NDL INSUFFLATION 14GA 120MM (NEEDLE) ×2 IMPLANT
NDL SPNL 22GX7 QUINCKE BK (NEEDLE) ×2 IMPLANT
NEEDLE INSUFFLATION 14GA 120MM (NEEDLE) ×3 IMPLANT
NEEDLE SPNL 22GX7 QUINCKE BK (NEEDLE) ×3 IMPLANT
PACK ROBOT UROLOGY CUSTOM (CUSTOM PROCEDURE TRAY) ×3 IMPLANT
PAD POSITIONING PINK XL (MISCELLANEOUS) ×3 IMPLANT
PORT ACCESS TROCAR AIRSEAL 12 (TROCAR) ×2 IMPLANT
PORT ACCESS TROCAR AIRSEAL 5M (TROCAR) ×1
RELOAD STAPLE 45 4.1 GRN THCK (STAPLE) ×2 IMPLANT
SEAL CANN UNIV 5-8 DVNC XI (MISCELLANEOUS) ×8 IMPLANT
SEAL XI 5MM-8MM UNIVERSAL (MISCELLANEOUS) ×12
SET TRI-LUMEN FLTR TB AIRSEAL (TUBING) ×3 IMPLANT
SOLUTION ELECTROLUBE (MISCELLANEOUS) ×3 IMPLANT
SPIKE FLUID TRANSFER (MISCELLANEOUS) ×3 IMPLANT
SPONGE GAUZE 2X2 STER 10/PKG (GAUZE/BANDAGES/DRESSINGS)
SPONGE T-LAP 4X18 ~~LOC~~+RFID (SPONGE) ×3 IMPLANT
STAPLE RELOAD 45 GRN (STAPLE) ×2 IMPLANT
STAPLE RELOAD 45MM GREEN (STAPLE) ×3
SUT ETHILON 3 0 PS 1 (SUTURE) ×3 IMPLANT
SUT MNCRL AB 4-0 PS2 18 (SUTURE) ×6 IMPLANT
SUT PDS AB 1 CT1 27 (SUTURE) ×6 IMPLANT
SUT VIC AB 2-0 SH 27 (SUTURE) ×3
SUT VIC AB 2-0 SH 27X BRD (SUTURE) ×2 IMPLANT
SUT VIC AB 3-0 SH 27 (SUTURE) ×3
SUT VIC AB 3-0 SH 27XBRD (SUTURE) IMPLANT
SUT VICRYL 0 UR6 27IN ABS (SUTURE) ×3 IMPLANT
SUT VLOC BARB 180 ABS3/0GR12 (SUTURE) ×9
SUTURE VLOC BRB 180 ABS3/0GR12 (SUTURE) ×6 IMPLANT
SYR 27GX1/2 1ML LL SAFETY (SYRINGE) ×3 IMPLANT
TOWEL OR NON WOVEN STRL DISP B (DISPOSABLE) ×3 IMPLANT
TROCAR Z-THREAD FIOS 5X100MM (TROCAR) IMPLANT
WATER STERILE IRR 1000ML POUR (IV SOLUTION) ×3 IMPLANT

## 2022-01-08 NOTE — Anesthesia Procedure Notes (Signed)
Procedure Name: Intubation Date/Time: 01/08/2022 11:22 AM  Performed by: Jonna Munro, CRNAPre-anesthesia Checklist: Patient identified, Emergency Drugs available, Suction available, Patient being monitored and Timeout performed Patient Re-evaluated:Patient Re-evaluated prior to induction Oxygen Delivery Method: Circle system utilized Preoxygenation: Pre-oxygenation with 100% oxygen Induction Type: IV induction Ventilation: Mask ventilation without difficulty Laryngoscope Size: Mac and 3 Grade View: Grade II Tube type: Oral Tube size: 7.5 mm Number of attempts: 1 Airway Equipment and Method: Stylet Placement Confirmation: ETT inserted through vocal cords under direct vision, positive ETCO2 and breath sounds checked- equal and bilateral Secured at: 23 cm Tube secured with: Tape Dental Injury: Teeth and Oropharynx as per pre-operative assessment

## 2022-01-08 NOTE — Brief Op Note (Signed)
01/08/2022  2:11 PM  PATIENT:  Jenetta Loges  66 y.o. male  PRE-OPERATIVE DIAGNOSIS:  PROSTATE CANCER  POST-OPERATIVE DIAGNOSIS:  PROSTATE CANCER  PROCEDURE:  Procedure(s): XI ROBOTIC ASSISTED LAPAROSCOPIC RADICAL PROSTATECTOMY AND INDOCYANINE GREEN DYE (N/A) LYMPH NODE DISSECTION (Bilateral)  SURGEON:  Surgeon(s) and Role:    * Alexis Frock, MD - Primary  PHYSICIAN ASSISTANT:   ASSISTANTS: Debbrah Alar PA  ANESTHESIA:   local and general  EBL:  130 mL   BLOOD ADMINISTERED:none  DRAINS:  1 - JP; 2 - Foley to gravity    LOCAL MEDICATIONS USED:  MARCAINE     SPECIMEN:  Source of Specimen:  1 - periprostatic fat; 2 - pelvic lymph nodes; 3 - prostatectomy  DISPOSITION OF SPECIMEN:  PATHOLOGY  COUNTS:  YES  TOURNIQUET:  * No tourniquets in log *  DICTATION: .Other Dictation: Dictation Number   [id number]  PLAN OF CARE: Admit for overnight observation  PATIENT DISPOSITION:  PACU - hemodynamically stable.   Delay start of Pharmacological VTE agent (>24hrs) due to surgical blood loss or risk of bleeding: yes

## 2022-01-08 NOTE — Transfer of Care (Signed)
Immediate Anesthesia Transfer of Care Note  Patient: Daniel Davis.  Procedure(s) Performed: XI ROBOTIC ASSISTED LAPAROSCOPIC RADICAL PROSTATECTOMY AND INDOCYANINE GREEN DYE (Abdomen) LYMPH NODE DISSECTION (Bilateral: Abdomen)  Patient Location: PACU  Anesthesia Type:General  Level of Consciousness: awake, alert , oriented and patient cooperative  Airway & Oxygen Therapy: Patient Spontanous Breathing and Patient connected to face mask oxygen  Post-op Assessment: Report given to RN, Post -op Vital signs reviewed and stable and Patient moving all extremities X 4  Post vital signs: Reviewed and stable  Last Vitals:  Vitals Value Taken Time  BP 119/80 01/08/22 1422  Temp    Pulse 84 01/08/22 1426  Resp 13 01/08/22 1426  SpO2 100 % 01/08/22 1426  Vitals shown include unvalidated device data.  Last Pain:  Vitals:   01/08/22 0950  TempSrc:   PainSc: 0-No pain      Patients Stated Pain Goal: 3 (35/57/32 2025)  Complications: No notable events documented.

## 2022-01-08 NOTE — Progress Notes (Signed)
   01/08/22 1855  Mobility  Activity Ambulated with assistance in hallway  Range of Motion/Exercises Active;All extremities  Level of Assistance Contact guard assist, steadying assist  Assistive Device None  Distance Ambulated (ft) 120 ft  Activity Response Tolerated fair   Patient tolerated fair ambulating in the hall for the first time post-op. No gas passed at this time.  Layla Maw, RN

## 2022-01-08 NOTE — Discharge Instructions (Signed)

## 2022-01-08 NOTE — H&P (Signed)
Daniel Davis Class. is an 66 y.o. male.    Chief Complaint: Pre-OP Prostatectomy  HPI:   1 - Moderate Risk Prostate Cancer - 6/12 core up to 60% grade 3 disease on BX 2023 on eval rising PSA to 6.4. TRUS 37 mL, no median. PET CT 10/2021 without distant disease. All left sided cores positive and left hemi-prostate very PET-avid.   PMH sig for ankel surgery. NO CV disease / blood thinnes. 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 His PCP is Janeann Forehand MD.   Today " Daniel Davis " is seen to proceed with prostatectomy / node dissection. No interval fevers.    Past Medical History:  Diagnosis Date   Arthritis    Colon polyps    Gout attack    Hyperlipidemia    Hypertension     Past Surgical History:  Procedure Laterality Date   COLONOSCOPY     FRACTURE SURGERY  08/02/1998   LEFT ankle, ORIF   WISDOM TOOTH EXTRACTION      Family History  Problem Relation Age of Onset   Diabetes Mother    Colon cancer Mother 31   Hypertension Mother    Hypertension Father    Heart disease Father    Stroke Father    Social History:  reports that he has never smoked. He has never used smokeless tobacco. He reports current alcohol use of about 3.0 standard drinks of alcohol per week. He reports that he does not use drugs.  Allergies:  Allergies  Allergen Reactions   Gadolinium Derivatives Hives    11/18/17 developed 4-5 hives on neck and face; no other complaints.  Treated with Benadryl '25mg'$  PO (pt given 2 more caps to take with him for 3.5 mile drive home).  Needs Benadryl '50mg'$  PO pre-med prior to next MRI w/ gad, per Dr. Nelson Chimes.   Methocarbamol Hives    No medications prior to admission.    No results found for this or any previous visit (from the past 48 hour(s)). No results found.  Review of Systems  Constitutional:  Negative for chills and fever.  All other systems reviewed and are negative.   There were no vitals taken for this visit. Physical Exam Vitals  reviewed.  HENT:     Head: Normocephalic.     Mouth/Throat:     Mouth: Mucous membranes are moist.  Eyes:     Pupils: Pupils are equal, round, and reactive to light.  Cardiovascular:     Rate and Rhythm: Normal rate.  Pulmonary:     Effort: Pulmonary effort is normal.  Abdominal:     General: Abdomen is flat.  Genitourinary:    Comments: No  CVAT at present Musculoskeletal:        General: Normal range of motion.     Cervical back: Normal range of motion.  Skin:    General: Skin is warm.  Neurological:     General: No focal deficit present.     Mental Status: He is alert.  Psychiatric:        Mood and Affect: Mood normal.      Assessment/Plan  Proceed as planned with robotic prostatectomy / node dissection. Risks, benefits, alternatives, expected peri-op course discussed previously and reiterated today.   Alexis Frock, MD 01/08/2022, 6:57 AM

## 2022-01-08 NOTE — Anesthesia Postprocedure Evaluation (Signed)
Anesthesia Post Note  Patient: Daniel Davis.  Procedure(s) Performed: XI ROBOTIC ASSISTED LAPAROSCOPIC RADICAL PROSTATECTOMY AND INDOCYANINE GREEN DYE (Abdomen) LYMPH NODE DISSECTION (Bilateral: Abdomen)     Patient location during evaluation: PACU Anesthesia Type: General Level of consciousness: awake and alert Pain management: pain level controlled Vital Signs Assessment: post-procedure vital signs reviewed and stable Respiratory status: spontaneous breathing, nonlabored ventilation, respiratory function stable and patient connected to nasal cannula oxygen Cardiovascular status: blood pressure returned to baseline and stable Postop Assessment: no apparent nausea or vomiting Anesthetic complications: no   No notable events documented.  Last Vitals:  Vitals:   01/08/22 1512 01/08/22 1528  BP: (!) 149/88 (!) 144/86  Pulse: 88 88  Resp: 19 20  Temp: 36.6 C 36.6 C  SpO2: 100% 100%    Last Pain:  Vitals:   01/08/22 1528  TempSrc: Oral  PainSc:                  Barnet Glasgow

## 2022-01-09 ENCOUNTER — Encounter (HOSPITAL_COMMUNITY): Payer: Self-pay | Admitting: Urology

## 2022-01-09 DIAGNOSIS — C61 Malignant neoplasm of prostate: Secondary | ICD-10-CM | POA: Diagnosis not present

## 2022-01-09 LAB — BASIC METABOLIC PANEL
Anion gap: 6 (ref 5–15)
BUN: 7 mg/dL — ABNORMAL LOW (ref 8–23)
CO2: 24 mmol/L (ref 22–32)
Calcium: 8.9 mg/dL (ref 8.9–10.3)
Chloride: 106 mmol/L (ref 98–111)
Creatinine, Ser: 0.78 mg/dL (ref 0.61–1.24)
GFR, Estimated: >60 mL/min (ref >60)
Glucose, Bld: 118 mg/dL — ABNORMAL HIGH (ref 70–99)
Potassium: 3.8 mmol/L (ref 3.5–5.1)
Sodium: 136 mmol/L (ref 135–145)

## 2022-01-09 LAB — CREATININE, FLUID (PLEURAL, PERITONEAL, JP DRAINAGE): Creat, Fluid: 0.7 mg/dL

## 2022-01-09 LAB — HEMOGLOBIN AND HEMATOCRIT, BLOOD
HCT: 33.3 % — ABNORMAL LOW (ref 39.0–52.0)
Hemoglobin: 11.3 g/dL — ABNORMAL LOW (ref 13.0–17.0)

## 2022-01-09 MED ORDER — ACETAMINOPHEN 500 MG PO TABS
1000.0000 mg | ORAL_TABLET | Freq: Four times a day (QID) | ORAL | Status: DC
  Administered 2022-01-09: 1000 mg via ORAL
  Filled 2022-01-09: qty 2

## 2022-01-09 NOTE — Plan of Care (Signed)

## 2022-01-09 NOTE — Discharge Summary (Signed)
Date of admission: 01/08/2022  Date of discharge: 01/09/2022  Admission diagnosis: Prostate cancer   Discharge diagnosis: Prostate cancer   Secondary diagnoses:  Patient Active Problem List   Diagnosis Date Noted   Prostate cancer ([place]) 01/08/2022   Nonspecific abnormal finding in stool contents 02/28/2015   Family history of cancer 02/28/2015   OA (osteoarthritis) 02/28/2012   HTN (hypertension) 11/27/2011   Lipid disorder 11/27/2011   Gout attack 11/27/2011    Procedures performed: Procedure(s): XI ROBOTIC ASSISTED LAPAROSCOPIC RADICAL PROSTATECTOMY AND INDOCYANINE GREEN DYE LYMPH NODE DISSECTION  History and Physical: For full details, please see admission history and physical. Briefly, Daniel Davis. is a 66 y.o. year old patient with history of prostate cancer who presented for robotic radical prostatectomy with lymph node dissection on 01/08/22.   Hospital Course: Patient tolerated the procedure well.  He was then transferred to the floor after an uneventful PACU stay.  His hospital course was uncomplicated.  He had no issues overnight POD0 and began ambulating and tolerating a clear liquid diet. POD1 am labs were stable. On POD1 he was given a regular diet and tolerated this without issues. JP creatinine was consistent with serum so the JP drain was removed prior to discharge. On POD#1 he had met discharge criteria: was eating a regular diet, was up and ambulating independently,  pain was well controlled, catheter was draining well, and was ready to for discharge.  He has follow up 01/18/22 with Dr. Tresa Moore for catheter removal and pathology discussion    Laboratory values:  Recent Labs    01/08/22 1439 01/09/22 0533  HGB 13.2 11.3*  HCT 40.4 33.3*   Recent Labs    01/09/22 0533  NA 136  K 3.8  CL 106  CO2 24  GLUCOSE 118*  BUN 7*  CREATININE 0.78  CALCIUM 8.9   No results for input(s): "LABPT", "INR" in the last 72 hours. No results for input(s): "LABURIN" in  the last 72 hours. No results found for this or any previous visit.  Disposition: Home  Discharge instruction: The patient was instructed to be ambulatory but told to refrain from heavy lifting, strenuous activity, or driving.   Discharge medications:  Allergies as of 01/09/2022       Reactions   Gadolinium Derivatives Hives   11/18/17 developed 4-5 hives on neck and face; no other complaints.  Treated with Benadryl $RemoveBefor'25mg'mRMyEtEBqpym$  PO (pt given 2 more caps to take with him for 3.5 mile drive home).  Needs Benadryl $RemoveBefore'50mg'sZvvFfRJCirwV$  PO pre-med prior to next MRI w/ gad, per Dr. Nelson Chimes.   Methocarbamol Hives        Medication List     STOP taking these medications    aspirin 81 MG tablet       TAKE these medications    allopurinol 100 MG tablet Commonly known as: ZYLOPRIM Take 1 tablet (100 mg total) by mouth daily.   amLODipine 5 MG tablet Commonly known as: NORVASC Take 1 tablet (5 mg total) by mouth daily.   atorvastatin 20 MG tablet Commonly known as: LIPITOR take 1 tablet by mouth at bedtime   docusate sodium 100 MG capsule Commonly known as: COLACE Take 1 capsule (100 mg total) by mouth 2 (two) times daily.   ferrous sulfate 325 (65 FE) MG tablet Take 325 mg by mouth daily with breakfast.   HYDROcodone-acetaminophen 5-325 MG tablet Commonly known as: Norco Take 1-2 tablets by mouth every 6 (six) hours as needed for moderate  pain or severe pain.   indomethacin 50 MG capsule Commonly known as: INDOCIN Take 1 capsule (50 mg total) by mouth 3 (three) times daily as needed. for MILD PAIN(GOUT)   lisinopril 20 MG tablet Commonly known as: ZESTRIL Take 1 tablet (20 mg total) by mouth daily.   sulfamethoxazole-trimethoprim 800-160 MG tablet Commonly known as: BACTRIM DS Take 1 tablet by mouth 2 (two) times daily. Start the day prior to foley removal appointment        Followup:   Follow-up Information     Alexis Frock, MD Follow up on 01/18/2022.   Specialty:  Urology Why: at 8:45 AM for MD visit, pathology review, and catheter removal Contact information: [address] [place] [address] [phone]

## 2022-01-09 NOTE — Progress Notes (Signed)
Patient given discharge, medication, follow up, and foley catheter care instructions, verbalized understanding with teach back, IV and JP drain removed per order, personal belongings with patient, family to transport home

## 2022-01-09 NOTE — Op Note (Unsigned)
NAMEFILIBERTO, Daniel Davis ACCOUNT NO: [id number] DATE OF BIRTH: 1956-07-17 FACILITY: [facility] LOCATION: [id number] PHYSICIAN: Daniel Frock, Daniel Davis  Operative Report   DATE OF PROCEDURE: 01/08/2022  PREOPERATIVE DIAGNOSIS:  Prostate cancer.  PROCEDURES:   1.  Robotic-assisted laparoscopic radical prostatectomy. 2.  Bilateral pelvic lymphadenectomy.  ESTIMATED BLOOD LOSS:  130 mL  ASSISTANT:  Debbrah Alar, PA  DRAINS:   1.  Jackson-Pratt drain to bulb suction. 2.  Foley to straight drain.  SPECIMENS:   1.  Periprostatic fat. 2.  Radical prostatectomy. 3.  Bilateral pelvic lymph nodes, all for permanent pathology.  FINDINGS:  Sentinel lymph nodes in the pelvis, left greater than right.  INDICATIONS:  This is a very pleasant 66 year old man who was found on workup of elevated PSA to have fairly aggressive moderate volume prostate cancer on the left side.  He underwent staging imaging, which was not impressive for any distant disease.   Options were discussed for definitive management including observation versus surveillance versus surgical extirpation versus ablative therapies.  The patient wished to proceed with prostatectomy with curative intent.  Informed consent was obtained and  placed in medical record.  PROCEDURE IN DETAIL:  The patient being himself verified, procedure being radical prostatectomy was confirmed.  Procedure timeout was performed.  Intravenous antibiotics were administered.  General endotracheal anesthesia induced.  The patient was placed  into a low lithotomy position.  Sterile field was created, prepped and draped the patient's penis, perineum, and proximal thighs using iodine and his infra-xiphoid abdomen using chlorhexidine gluconate after he was further fastened to operating table  using 3-inch tape over foam padding.  A test of steep Trendelenburg positioning was performed and found to be suitably positioned.  His arms were tucked to the  side with gel roll.  Foley catheter was placed free to straight drain.  Next, a high-flow,  low-pressure, pneumoperitoneum was obtained using Veress technique in the supraumbilical midline having passed the aspiration and drop test.  An 8 mm robotic camera port was then placed in the location.  Laparoscopic examination of peritoneal cavity did  reveal some mild loose adhesions, right lower quadrant, likely consistent with remote history of appendicitis, no active infection noted.  Additional ports were placed as follows:  Right paramedian 8 mm robotic port, right far lateral 12 mm AirSeal  assistant port, right paramedian 5 mm suction port, left paramedian 8 mm robotic port, left far lateral 8 mm robotic port.  Robot was docked and passed the electronic checks.  Attention was directed at limited adhesiolysis.  The loose adhesions between  the distal ileum and cecum area were carefully released away from the anterior abdominal wall and right lower quadrant.  Appendix was noted.  There was no obvious active infection.  This allowed much better view of the true pelvis and the space of  Retzius was developed by incising lateral to the right medial umbilical ligament from the midline towards the internal ring coursing along the iliac vessels towards the area of the right ureter.  Right vas deferens was encountered, ligated, and placed on  gentle medial traction and the right bladder wall swept away from the pelvic sidewall towards the area of the endopelvic fascia on the right side.  A mirror image dissection was performed on the left side.  Anterior attachments were taken down with  cautery scissors to expose the anterior base of the prostate, which was defatted to better denote the bladder neck prostate junction.  Next, 0.2  mL of indocyanine green dye was injected in each lobe of the prostate using a percutaneously placed  robotically guided spinal needle for sentinel lymphangiography.  Next, the endopelvic  fascia was swept away from the lateral aspect of the prostate in a base to apex orientation to expose the dorsal venous complex, which was controlled using green load  stapler resulting in excellent hemostatic control and then approximately 10 minutes post-dye injection, the pelvis was inspected under near infrared fluorescence light.  Sentinel lymphangiography revealed excellent parenchymal uptake of the prostate and multiple sentinel lymphatic channels on both sides coursing towards the pelvic lymph node fields. Lymphadenectomy was performed on the right side, the right external  iliac group with boundaries being right external iliac artery, vein, pelvic sidewall.  Lymphostasis achieved with cold clips set aside labeled as right external iliac lymph nodes.  Next, right obturator group was dissected free with the boundaries being  right external iliac vein, pelvic sidewall, obturator nerve.  Lymphostasis was achieved with cold clips set aside labeled as right obturator lymph nodes.  The right obturator nerve was inspected following maneuvers and found to be uninjured.  A mirror  image lymphadenectomy was performed of the left side, left external iliac, left obturator groups respectively.  There were sentinel lymph nodes within these packets.  There were denoted on pathology requisition.  There was additional sentinel lymph nodes  noted, appeared to be in the left internal iliac orientation.  This was dissected free.  Lymphostasis achieved with cold clips set aside labeled as left internal iliac lymph nodes.  Attention was directed at bladder neck dissection.  Bladder neck was  identified moving the Foley catheter back and forth and a lateral release was performed on each side to better demarcate the caliber of the bladder neck and the bladder neck separated from base of the prostate in anterior and posterior direction, keeping  what appeared to be a rim of circular muscle fibers with each plane of  dissection.  Posterior dissection was performed by incising approximately 7 mm inferior posterior to the posterior lip of the prostate entering the plane of Denonvilliers.  The  Denonvilliers fascia was quite developed and this plane was developed towards the area of the apex of the prostate.  This exposed the vascular pedicles on each side, which were controlled using a sequential clipping technique in a base to apex  orientation with a purposeful narrow dissection on the right, performing moderate nerve sparing and purposeful wide dissection on the left side, not performing any sort of nerve sparing.  A final apical dissection was performed in the anterior plane  placing the prostate on gentle superior traction, transecting the membranous urethra coldly.  This completely freed up the prostate and the specimen was placed in the EndoCatch bag for later retrieval. Digital rectal exam was performed using an indicator  glove.  No evidence of rectal violation was noted.  Posterior reconstruction was performed using a 3-0 V-Loc suture reapproximating the posterior urethral plate to the posterior bladder neck, bringing the structures into tension-free apposition.  Next,  double-armed 3-0 V-Loc suture was used for mucosa-to-mucosa anastomosis from the 6 o'clock to 12 o'clock position, resulting in excellent tension-free apposition.  A new Foley catheter was then placed per urethra, irrigated quantitatively.  Sponge and  needle counts were correct.  Hemostasis was excellent.  A closed suction drain was brought out through the previous left lateral most robotic port site near the peritoneal cavity.  Robot was then undocked.  The previous right lateral assistant port site  was closed at the level of fascia using Carter-Thomason suture passer and 0 Vicryl.  Pneumoperitoneum was then released.  Specimen was retrieved by extending the previous camera port site superiorly and inferiorly for a total distance of  approximately 3  cm, removing the prostatectomy specimen setting aside for permanent pathology.  Extraction site was closed with fascia using figure-of-eight PDS x3 followed by reapproximation of Scarpa's with running Vicryl.  All incision sites were infiltrated with  dilute lipolyzed Marcaine and closed at the level of the skin using subcuticular Monocryl followed by Dermabond.  Procedure was then terminated.  The patient tolerated the procedure well, no immediate complications.  The patient was taken to  postanesthesia care unit in stable condition.  Plan for observation admission.  Please note, first assistant, Debbrah Alar, was crucial for all portions of the surgery today.  She provided invaluable retraction, suctioning, lymphatic clipping, vascular stapling and general first assistance.   PAA D: 01/08/2022 2:20:24 pm T: 01/09/2022 12:13:00 am  JOB: [id number]/ [id number]

## 2022-01-11 ENCOUNTER — Telehealth: Payer: Self-pay

## 2022-01-11 NOTE — Telephone Encounter (Signed)
Transition Care Management Unsuccessful Follow-up Telephone Call  Date of discharge and from where:  [facility] 01/08/22-01/09/22  Attempts:  1st Attempt  Reason for unsuccessful TCM follow-up call:  Unable to leave message  Johnney Killian, RN, BSN, CCM Care Management Coordinator Phone: [phone]: [phone]

## 2022-01-13 LAB — SURGICAL PATHOLOGY

## 2022-03-15 ENCOUNTER — Ambulatory Visit: Admitting: Family Medicine

## 2022-03-26 ENCOUNTER — Ambulatory Visit: Admitting: Family Medicine

## 2022-03-26 ENCOUNTER — Encounter: Payer: Self-pay | Admitting: Family Medicine

## 2022-03-26 VITALS — BP 132/86 | HR 77 | Temp 97.9°F | Wt 201.4 lb

## 2022-03-26 DIAGNOSIS — E785 Hyperlipidemia, unspecified: Secondary | ICD-10-CM

## 2022-03-26 DIAGNOSIS — Z789 Other specified health status: Secondary | ICD-10-CM

## 2022-03-26 DIAGNOSIS — R7303 Prediabetes: Secondary | ICD-10-CM

## 2022-03-26 DIAGNOSIS — M109 Gout, unspecified: Secondary | ICD-10-CM

## 2022-03-26 DIAGNOSIS — E781 Pure hyperglyceridemia: Secondary | ICD-10-CM | POA: Diagnosis not present

## 2022-03-26 DIAGNOSIS — I10 Essential (primary) hypertension: Secondary | ICD-10-CM

## 2022-03-26 DIAGNOSIS — F4321 Adjustment disorder with depressed mood: Secondary | ICD-10-CM

## 2022-03-26 DIAGNOSIS — Z23 Encounter for immunization: Secondary | ICD-10-CM | POA: Diagnosis not present

## 2022-03-26 LAB — LIPID PANEL
Cholesterol: 130 mg/dL (ref 0–200)
HDL: 70.9 mg/dL (ref >39.00)
Total CHOL/HDL Ratio: 2
Triglycerides: 478 mg/dL — ABNORMAL HIGH (ref 0.0–149.0)

## 2022-03-26 LAB — LDL CHOLESTEROL, DIRECT: Direct LDL: 40 mg/dL

## 2022-03-26 MED ORDER — INDOMETHACIN 50 MG PO CAPS: 50.0000 mg | ORAL_CAPSULE | Freq: Three times a day (TID) | ORAL | 1 refills | Status: DC | PRN

## 2022-03-26 MED ORDER — ATORVASTATIN CALCIUM 20 MG PO TABS: ORAL_TABLET | ORAL | 2 refills | Status: DC

## 2022-03-26 MED ORDER — AMLODIPINE BESYLATE 5 MG PO TABS: 5.0000 mg | ORAL_TABLET | Freq: Every day | ORAL | 2 refills | Status: DC

## 2022-03-26 MED ORDER — ALLOPURINOL 100 MG PO TABS: 100.0000 mg | ORAL_TABLET | Freq: Every day | ORAL | 2 refills | Status: DC

## 2022-03-26 MED ORDER — LISINOPRIL 20 MG PO TABS: 20.0000 mg | ORAL_TABLET | Freq: Every day | ORAL | 2 refills | Status: DC

## 2022-03-26 NOTE — Progress Notes (Signed)
Subjective:  Patient ID: Daniel Davis., male    DOB: [birth year]  Age: 66 y.o. MRN: [id number]  CC:  Chief Complaint  Patient presents with   Diabetes    Pt states been a little down after the surgery but not depressed     HPI [name]. presents for   Follow-up chronic conditions.  He did have a visit August 22 with primary provider through [place].    Prostate cancer: Prostate surgery June 9.  Follow-up with urologist September 21st.  Does report some stress around the prostate cancer diagnosis.  He was drinking more alcohol previously, with prior hypertriglyceridemia thought to be due to higher alcohol use.  Stress management discussed by Primary at [facility] few days ago.declined resources.  Trouble obtaining erections since surgery - plans to discuss with urology.  Prediabetes: Diet/exercise approach.  Weight up somewhat from June.  Did have recent surgery.  Last A1c still in prediabetes range few months ago. Lab Results  Component Value Date   HGBA1C 5.9 (H) 12/30/2021   Wt Readings from Last 3 Encounters:  03/26/22 201 lb 6.4 oz (91.4 kg)  01/08/22 192 lb 7.4 oz (87.3 kg)  12/30/21 192 lb (87.1 kg)   Hyperlipidemia: Treated with Lipitor 20 mg daily.  5 drinks per day - liquor.  Does not feel addicted, cutting back. 3 or less per day.  No abd pain/n/v.  Fasting today.  Lab Results  Component Value Date   CHOL 265 (H) 09/14/2021   HDL 45.20 09/14/2021   LDLCALC 54 09/12/2020   LDLDIRECT 43.0 09/14/2021   TRIG (H) 09/14/2021    1869.0 Triglyceride is over 400; calculations on Lipids are invalid.   CHOLHDL 6 09/14/2021   Lab Results  Component Value Date   ALT 16 09/14/2021   AST 49 (H) 09/14/2021   ALKPHOS 65 09/14/2021   BILITOT 0.4 09/14/2021   Gout: Last flare:none recently.  Daily meds: Allopurinol 100 mg daily Prn med: Indomethacin if needed Lab Results  Component Value Date   LABURIC 5.6 03/26/2020   Stress as above, feeling down with prostate  cancer treatment. Denies resources for counseling or medication at this time. Divorced, by self. Able to talk to friends. Walking daily for exercise.       03/26/2022    8:23 AM 09/14/2021    8:07 AM 03/11/2021    7:49 AM 09/12/2020    8:26 AM 03/26/2020    8:26 AM  Depression screen PHQ 2/9  Decreased Interest 0 1 0 0 0  Down, Depressed, Hopeless 0 0 0 0 1  PHQ - 2 Score 0 1 0 0 1  Altered sleeping 0    0  Tired, decreased energy 0    0  Change in appetite 0    1  Feeling bad or failure about yourself  0    1  Trouble concentrating 0    0  Moving slowly or fidgety/restless 0    0  Suicidal thoughts 0    0  PHQ-9 Score 0    3      Hypertension: Amlodipine 5 mg daily, lisinopril 20 mg daily. Home readings: BP Readings from Last 3 Encounters:  03/26/22 132/86  01/09/22 127/80  12/30/21 (!) 143/93   Lab Results  Component Value Date   CREATININE 0.78 01/09/2022       History Patient Active Problem List   Diagnosis Date Noted   Prostate cancer ([place]) 01/08/2022   Nonspecific abnormal  finding in stool contents 02/28/2015   Family history of cancer 02/28/2015   OA (osteoarthritis) 02/28/2012   HTN (hypertension) 11/27/2011   Lipid disorder 11/27/2011   Gout attack 11/27/2011   Past Medical History:  Diagnosis Date   Arthritis    Colon polyps    Gout attack    Hyperlipidemia    Hypertension    Past Surgical History:  Procedure Laterality Date   COLONOSCOPY     FRACTURE SURGERY  08/02/1998   LEFT ankle, ORIF   LYMPH NODE DISSECTION Bilateral 01/08/2022   Procedure: LYMPH NODE DISSECTION;  Surgeon: Alexis Frock, MD;  Location: [facility];  Service: Urology;  Laterality: Bilateral;   ROBOT ASSISTED LAPAROSCOPIC RADICAL PROSTATECTOMY N/A 01/08/2022   Procedure: XI ROBOTIC ASSISTED LAPAROSCOPIC RADICAL PROSTATECTOMY AND INDOCYANINE GREEN DYE;  Surgeon: Alexis Frock, MD;  Location: [facility];  Service: Urology;  Laterality: N/A;   WISDOM TOOTH EXTRACTION     Allergies   Allergen Reactions   Gadolinium Derivatives Hives    11/18/17 developed 4-5 hives on neck and face; no other complaints.  Treated with Benadryl '25mg'$  PO (pt given 2 more caps to take with him for 3.5 mile drive home).  Needs Benadryl '50mg'$  PO pre-med prior to next MRI w/ gad, per Dr. Nelson Chimes.   Methocarbamol Hives   Prior to Admission medications   Medication Sig Start Date End Date Taking? Authorizing Provider  docusate sodium (COLACE) 100 MG capsule Take 1 capsule (100 mg total) by mouth 2 (two) times daily. 01/08/22  Yes Dancy, Estill Bamberg, PA-C  ferrous sulfate 325 (65 FE) MG tablet Take 325 mg by mouth daily with breakfast.   Yes [provider]  allopurinol (ZYLOPRIM) 100 MG tablet Take 1 tablet (100 mg total) by mouth daily. 03/26/22   [name], MD  amLODipine (NORVASC) 5 MG tablet Take 1 tablet (5 mg total) by mouth daily. 03/26/22   [name], MD  atorvastatin (LIPITOR) 20 MG tablet take 1 tablet by mouth at bedtime 03/26/22   [name], MD  HYDROcodone-acetaminophen [facility]) 5-325 MG tablet Take 1-2 tablets by mouth every 6 (six) hours as needed for moderate pain or severe pain. Patient not taking: Reported on 03/26/2022 01/08/22   Debbrah Alar, PA-C  indomethacin (INDOCIN) 50 MG capsule Take 1 capsule (50 mg total) by mouth 3 (three) times daily as needed. for MILD PAIN(GOUT) 03/26/22   [name], MD  lisinopril (ZESTRIL) 20 MG tablet Take 1 tablet (20 mg total) by mouth daily. 03/26/22   [name], MD  sulfamethoxazole-trimethoprim (BACTRIM DS) 800-160 MG tablet Take 1 tablet by mouth 2 (two) times daily. Start the day prior to foley removal appointment Patient not taking: Reported on 03/26/2022 01/08/22   Debbrah Alar, PA-C   Social History   Socioeconomic History   Marital status: Divorced    Spouse name: [name]   Number of children: 1   Years of education: 14+   Highest education level: Not on file  Occupational History   Occupation:  SHIFT SUPERVISOR    Employer: [employer]  Tobacco Use   Smoking status: Never   Smokeless tobacco: Never  Vaping Use   Vaping Use: Never used  Substance and Sexual Activity   Alcohol use: Yes    Alcohol/week: 3.0 standard drinks of alcohol    Types: 3 Standard drinks or equivalent per week    Comment: 3-5 drinks   Drug use: No   Sexual activity: Yes  Partners: Female  Other Topics Concern   Not on file  Social History Narrative   Lives with his wife and their daughter.   Exercise: Walking   Education: some Artist of Radio broadcast assistant Strain: Not on file  Food Insecurity: Not on file  Transportation Needs: Not on file  Physical Activity: Not on file  Stress: Not on file  Social Connections: Not on file  Intimate Partner Violence: Not on file   Review of Systems Per HPI  Objective:   Vitals:   03/26/22 0826  BP: 132/86  Pulse: 77  Temp: 97.9 F (36.6 C)  SpO2: 99%  Weight: 201 lb 6.4 oz (91.4 kg)     Physical Exam Vitals reviewed.  Constitutional:      Appearance: He is well-developed.  HENT:     Head: Normocephalic and atraumatic.  Neck:     Vascular: No carotid bruit or JVD.  Cardiovascular:     Rate and Rhythm: Normal rate and regular rhythm.     Heart sounds: Normal heart sounds. No murmur heard. Pulmonary:     Effort: Pulmonary effort is normal.     Breath sounds: Normal breath sounds. No rales.  Abdominal:     General: Abdomen is flat. Bowel sounds are normal. There is no distension.     Tenderness: There is no abdominal tenderness. There is no guarding.  Musculoskeletal:     Right lower leg: No edema.     Left lower leg: No edema.  Skin:    General: Skin is warm and dry.  Neurological:     Mental Status: He is alert and oriented to person, place, and time.  Psychiatric:        Mood and Affect: Mood normal.        Assessment & Plan:  Thelmer Legler. is a 66 y.o. male . Hyperlipidemia,  unspecified hyperlipidemia type - Plan: atorvastatin (LIPITOR) 20 MG tablet  -Tolerating Lipitor, check labs.  Adjust medication accordingly.  Gout of multiple sites, unspecified cause, unspecified chronicity - Plan: allopurinol (ZYLOPRIM) 100 MG tablet, indomethacin (INDOCIN) 50 MG capsule  -No recent flare, has indomethacin if needed, continue allopurinol same dose.  Essential hypertension - Plan: amLODipine (NORVASC) 5 MG tablet, lisinopril (ZESTRIL) 20 MG tablet  -Stable control on current regimen, continue same with labs as above.  Hypertriglyceridemia - Plan: Lipid panel Alcohol use  -Likely related to alcohol intake previously, denies abdominal pain, vomiting.  Potential risks of hypertriglyceridemia including pancreatitis discussed.  Plans to cut back on alcohol, denies need for assistance at this time but resources were provided with RTC precautions.  Adjustment disorder with depressed mood  Suspect adjustment disorder with prostate cancer diagnosis.  Handout given.  Discussed potential need for counseling but declined at this time.  Prediabetes  -Diet, exercise approach  Meds ordered this encounter  Medications   allopurinol (ZYLOPRIM) 100 MG tablet    Sig: Take 1 tablet (100 mg total) by mouth daily.    Dispense:  90 tablet    Refill:  2   amLODipine (NORVASC) 5 MG tablet    Sig: Take 1 tablet (5 mg total) by mouth daily.    Dispense:  90 tablet    Refill:  2   atorvastatin (LIPITOR) 20 MG tablet    Sig: take 1 tablet by mouth at bedtime    Dispense:  90 tablet    Refill:  2   indomethacin (INDOCIN) 50 MG capsule  Sig: Take 1 capsule (50 mg total) by mouth 3 (three) times daily as needed. for MILD PAIN(GOUT)    Dispense:  60 capsule    Refill:  1   lisinopril (ZESTRIL) 20 MG tablet    Sig: Take 1 tablet (20 mg total) by mouth daily.    Dispense:  90 tablet    Refill:  2   Patient Instructions  I recommend discussing the erectile dysfunction with your urologist  since you have had a surgery recently.   Please let me know if you would like to meet with a counselor and I am happy to provide some resources. Hang in there.  Continue to cut back on alcohol use.  If you need help with decreasing alcohol use, here are some options: [facility]  Address: [address] [place], [place] [address]  Phone: [phone]  [facility]  Address: [address] [place], [place] [address]  Phone: [phone]  [facility] Address: [place], [place], [place] [address]  Phone: [phone]   Adjustment Disorder, Adult Adjustment disorder is a group of symptoms that can develop after a stressful life event, such as the loss of a job or a serious physical illness. The symptoms can affect how you feel, think, and act. They may also interfere with your relationships. Adjustment disorder increases your risk of suicide and substance abuse. If adjustment disorder is not managed early, it can make medical conditions that you already have worse. If the stressful life event persists, the disorder may continue and become a persistent form of adjustment disorder. What are the causes? This condition is caused by difficulty recovering from or coping with a stressful life event. What increases the risk? You are more likely to develop this condition if: You have had previous problems coping with life stressors. You are being treated for a long-term (chronic) illness. You are being treated for an illness that cannot be cured (terminal illness). You have a family history of mental illness. What are the signs or symptoms? Symptoms of this condition include: Behavioral symptoms such as: Trouble doing daily tasks. Reckless driving. Poor work Systems analyst. Ignoring bills. Avoiding family and friends. Impulsive actions. Emotional symptoms such as: Sadness, depression, or crying spells. Worrying a lot, or feeling nervous or anxious. Loss of  enjoyment. Feelings of loss or hopelessness. Irritability. Thoughts of suicide. Physical symptoms such as: Change in appetite or weight. Complaining of feeling sick without being ill. Feeling dazed or disconnected. Nightmares. Trouble sleeping. Symptoms of this condition start within 3 months of the stressful event. They do not last more than 6 months, unless the stressful circumstances last longer. Normal grieving after the death of a loved one is not a symptom of this condition. How is this diagnosed? To diagnose this condition, your health care provider will ask about what has happened in your life and how it has affected you. He or she may also ask about your medical history and your use of medicines, alcohol, and other substances. Your health care provider may do a physical exam and order lab tests or other studies. You may be referred to a mental health specialist. How is this treated? Treatment options for this condition include: Counseling or talk therapy. Talk therapy is usually provided by mental health specialists. This therapy may be individual or may involve family members. Medicines. Certain medicines may help with depression, anxiety, and sleep. Support groups. These offer emotional support, advice, and guidance. They are made up  of people who have had similar experiences. Observation and time. This is sometimes called watchful waiting. In this treatment, health care providers monitor your health and behavior without other treatment. Adjustment disorder sometimes gets better on its own with time. Follow these instructions at home: Take over-the-counter and prescription medicines only as told by your health care provider. Keep all follow-up visits. This is important. Contact trusted family and friends for support. Let them know what is going on with you and how they can help. Contact a health care provider if: Your symptoms do not improve in 6 months. Your symptoms get  worse. Get help right away if: You have serious thoughts about hurting yourself or someone else. If you ever feel like you may hurt yourself or others, or have thoughts about taking your own life, get help right away. Go to your nearest emergency department or: Call your local emergency services (911 in the [place] Call a suicide crisis helpline, such as the [facility] at 615-697-2348 or 988 in the [place]. This is open 24 hours a day in the [place] Text the Crisis Text Line at 402-820-3447 (in the [place] Summary Adjustment disorder is a group of symptoms that can develop after a stressful life event, such as the loss of a job or a serious physical illness. The symptoms can affect how you feel, think, and act. They may interfere with your relationships. Symptoms of this condition start within 3 months of the stressful event. They do not last more than 6 months, unless the stressful circumstances last longer. Treatment may include talk therapy, medicines, participation in a support group, or observation to see if symptoms improve. Contact your health care provider if your symptoms get worse or do not improve in 6 months. If you ever feel like you may hurt yourself or others, or have thoughts about taking your own life, get help right away. This information is not intended to replace advice given to you by your health care provider. Make sure you discuss any questions you have with your health care provider. Document Revised: 02/11/2021 Document Reviewed: 11/30/2019 Elsevier Patient Education  [place],   Merri Ray, MD [place], [facility] 03/26/22 8:54 AM

## 2022-03-26 NOTE — Patient Instructions (Addendum)
I recommend discussing the erectile dysfunction with your urologist since you have had a surgery recently.   Please let me know if you would like to meet with a counselor and I am happy to provide some resources. Hang in there.  Continue to cut back on alcohol use.  If you need help with decreasing alcohol use, here are some options: [facility]  Address: [address], [place], [place] [address]  Phone: [phone]  [facility]  Address: [address], [place], [place] [address]  Phone: [phone]  [facility] Address: [place], [place], [place] [address]  Phone: [phone]   Adjustment Disorder, Adult Adjustment disorder is a group of symptoms that can develop after a stressful life event, such as the loss of a job or a serious physical illness. The symptoms can affect how you feel, think, and act. They may also interfere with your relationships. Adjustment disorder increases your risk of suicide and substance abuse. If adjustment disorder is not managed early, it can make medical conditions that you already have worse. If the stressful life event persists, the disorder may continue and become a persistent form of adjustment disorder. What are the causes? This condition is caused by difficulty recovering from or coping with a stressful life event. What increases the risk? You are more likely to develop this condition if: You have had previous problems coping with life stressors. You are being treated for a long-term (chronic) illness. You are being treated for an illness that cannot be cured (terminal illness). You have a family history of mental illness. What are the signs or symptoms? Symptoms of this condition include: Behavioral symptoms such as: Trouble doing daily tasks. Reckless driving. Poor work Systems analyst. Ignoring bills. Avoiding family and friends. Impulsive actions. Emotional symptoms such as: Sadness, depression, or crying  spells. Worrying a lot, or feeling nervous or anxious. Loss of enjoyment. Feelings of loss or hopelessness. Irritability. Thoughts of suicide. Physical symptoms such as: Change in appetite or weight. Complaining of feeling sick without being ill. Feeling dazed or disconnected. Nightmares. Trouble sleeping. Symptoms of this condition start within 3 months of the stressful event. They do not last more than 6 months, unless the stressful circumstances last longer. Normal grieving after the death of a loved one is not a symptom of this condition. How is this diagnosed? To diagnose this condition, your health care provider will ask about what has happened in your life and how it has affected you. He or she may also ask about your medical history and your use of medicines, alcohol, and other substances. Your health care provider may do a physical exam and order lab tests or other studies. You may be referred to a mental health specialist. How is this treated? Treatment options for this condition include: Counseling or talk therapy. Talk therapy is usually provided by mental health specialists. This therapy may be individual or may involve family members. Medicines. Certain medicines may help with depression, anxiety, and sleep. Support groups. These offer emotional support, advice, and guidance. They are made up of people who have had similar experiences. Observation and time. This is sometimes called watchful waiting. In this treatment, health care providers monitor your health and behavior without other treatment. Adjustment disorder sometimes gets better on its own with time. Follow these instructions at home: Take over-the-counter and prescription medicines only as told by your health care provider. Keep all follow-up visits. This is important. Contact trusted family and friends  for support. Let them know what is going on with you and how they can help. Contact a health care provider if: Your  symptoms do not improve in 6 months. Your symptoms get worse. Get help right away if: You have serious thoughts about hurting yourself or someone else. If you ever feel like you may hurt yourself or others, or have thoughts about taking your own life, get help right away. Go to your nearest emergency department or: Call your local emergency services (911 in the [place] Call a suicide crisis helpline, such as the [facility] at 431-233-8940 or 988 in the [place]. This is open 24 hours a day in the [place] Text the Crisis Text Line at 340-535-0697 (in the [place] Summary Adjustment disorder is a group of symptoms that can develop after a stressful life event, such as the loss of a job or a serious physical illness. The symptoms can affect how you feel, think, and act. They may interfere with your relationships. Symptoms of this condition start within 3 months of the stressful event. They do not last more than 6 months, unless the stressful circumstances last longer. Treatment may include talk therapy, medicines, participation in a support group, or observation to see if symptoms improve. Contact your health care provider if your symptoms get worse or do not improve in 6 months. If you ever feel like you may hurt yourself or others, or have thoughts about taking your own life, get help right away. This information is not intended to replace advice given to you by your health care provider. Make sure you discuss any questions you have with your health care provider. Document Revised: 02/11/2021 Document Reviewed: 11/30/2019 Elsevier Patient Education  [place].

## 2022-04-20 ENCOUNTER — Encounter: Payer: Self-pay | Admitting: Gastroenterology

## 2022-07-16 ENCOUNTER — Encounter: Payer: Self-pay | Admitting: Gastroenterology

## 2022-08-19 ENCOUNTER — Ambulatory Visit (AMBULATORY_SURGERY_CENTER): Admitting: *Deleted

## 2022-08-19 ENCOUNTER — Telehealth: Payer: Self-pay | Admitting: Gastroenterology

## 2022-08-19 VITALS — Ht 71.0 in | Wt 209.0 lb

## 2022-08-19 DIAGNOSIS — Z86010 Personal history of colonic polyps: Secondary | ICD-10-CM

## 2022-08-19 NOTE — Progress Notes (Signed)
No egg or soy allergy known to patient  No issues known to pt with past sedation with any surgeries or procedures Patient denies ever being told they had issues or difficulty with intubation  No FH of Malignant Hyperthermia Pt is not on diet pills Pt is not on  home 02  Pt is not on blood thinners  Pt denies issues with constipation, has been taking a stool softener as needed since prostate surgery but does not need it every day.    No A fib or A flutter Have any cardiac testing pending--no Pt instructed to use Singlecare.com or GoodRx for a price reduction on prep   Patient's chart reviewed by Osvaldo Angst CNRA prior to previsit and patient appropriate for the [facility].  Previsit completed and red dot placed by patient's name on their procedure day (on provider's schedule).    Pt has a prep that was sent to him by [facility].  After reviewing his last note from the [facility], concluded that the prep was Moviprep.  Changed instructions for movi and pt is to call back after checking if this is not correct.

## 2022-08-19 NOTE — Telephone Encounter (Signed)
Patient is calling to inform that [facility] Movi Prep.

## 2022-08-19 NOTE — Telephone Encounter (Signed)
Called pt and confirmed with him that the instructions reviewed and sent to him this morning were for Moviprep.

## 2022-09-02 ENCOUNTER — Encounter: Payer: Self-pay | Admitting: Certified Registered Nurse Anesthetist

## 2022-09-09 ENCOUNTER — Encounter: Payer: Self-pay | Admitting: Gastroenterology

## 2022-09-09 ENCOUNTER — Ambulatory Visit (AMBULATORY_SURGERY_CENTER): Admitting: Gastroenterology

## 2022-09-09 VITALS — BP 151/91 | HR 89 | Temp 97.3°F | Resp 17 | Ht 71.0 in | Wt 209.0 lb

## 2022-09-09 DIAGNOSIS — D122 Benign neoplasm of ascending colon: Secondary | ICD-10-CM | POA: Diagnosis not present

## 2022-09-09 DIAGNOSIS — Z09 Encounter for follow-up examination after completed treatment for conditions other than malignant neoplasm: Secondary | ICD-10-CM | POA: Diagnosis present

## 2022-09-09 DIAGNOSIS — Z86010 Personal history of colonic polyps: Secondary | ICD-10-CM | POA: Diagnosis not present

## 2022-09-09 DIAGNOSIS — D123 Benign neoplasm of transverse colon: Secondary | ICD-10-CM

## 2022-09-09 MED ORDER — SODIUM CHLORIDE 0.9 % IV SOLN: 500.0000 mL | Freq: Once | INTRAVENOUS | Status: DC

## 2022-09-09 NOTE — Op Note (Signed)
[facility] Patient Name: Daniel Davis Procedure Date: 09/09/2022 9:12 AM MRN: [id number] Endoscopist: Mallie Mussel L. Loletha Carrow , MD, [id number] Age: 67 Referring MD:  Date of Birth: 1956/04/02 Gender: Male Account #: [id number] Procedure:                Colonoscopy Indications:              Surveillance: Personal history of adenomatous                            polyps on last colonoscopy > 5 years ago                           Diminutive tubular adenoma August 2016 Medicines:                Monitored Anesthesia Care Procedure:                Pre-Anesthesia Assessment:                           - Prior to the procedure, a History and Physical                            was performed, and patient medications and                            allergies were reviewed. The patient's tolerance of                            previous anesthesia was also reviewed. The risks                            and benefits of the procedure and the sedation                            options and risks were discussed with the patient.                            All questions were answered, and informed consent                            was obtained. Prior Anticoagulants: The patient has                            taken no anticoagulant or antiplatelet agents. ASA                            Grade Assessment: II - A patient with mild systemic                            disease. After reviewing the risks and benefits,                            the patient was deemed in satisfactory condition to  undergo the procedure.                           After obtaining informed consent, the colonoscope                            was passed under direct vision. Throughout the                            procedure, the patient's blood pressure, pulse, and                            oxygen saturations were monitored continuously. The                            CF [id number] #[id number] was introduced  through the anus                            and advanced to the the cecum, identified by                            appendiceal orifice and ileocecal valve. The                            colonoscopy was performed without difficulty. The                            patient tolerated the procedure well. The quality                            of the bowel preparation was excellent. The                            ileocecal valve, appendiceal orifice, and rectum                            were photographed. The bowel preparation used was                            SUPREP via split dose instruction. Scope In: 9:28:27 AM Scope Out: 9:42:24 AM Scope Withdrawal Time: 0 hours 10 minutes 59 seconds  Total Procedure Duration: 0 hours 13 minutes 57 seconds  Findings:                 The perianal and digital rectal examinations were                            normal.                           Repeat examination of right colon under NBI                            performed.  A diminutive polyp was found in the ascending                            colon. The polyp was semi-sessile. The polyp was                            removed with a cold snare. Resection and retrieval                            were complete.                           Two sessile and semi-sessile polyps were found in                            the proximal transverse colon. The polyps were 4 to                            6 mm in size. These polyps were removed with a cold                            snare. Resection and retrieval were complete.                           Repeat examination of right colon under NBI                            performed.                           Multiple diverticula were found in the entire colon.                           Anal papilla(e) were hypertrophied.                           The exam was otherwise without abnormality on                            direct and  retroflexion views. Complications:            No immediate complications. Estimated Blood Loss:     Estimated blood loss was minimal. Impression:               - One diminutive polyp in the ascending colon,                            removed with a cold snare. Resected and retrieved.                           - Two 4 to 6 mm polyps in the proximal transverse                            colon, removed with a cold snare. Resected and  retrieved.                           - Diverticulosis in the entire examined colon.                           - Anal papilla(e) were hypertrophied.                           - The examination was otherwise normal on direct                            and retroflexion views. Recommendation:           - Patient has a contact number available for                            emergencies. The signs and symptoms of potential                            delayed complications were discussed with the                            patient. Return to normal activities tomorrow.                            Written discharge instructions were provided to the                            patient.                           - Resume previous diet.                           - Continue present medications.                           - Await pathology results.                           - Repeat colonoscopy is recommended for                            surveillance. The colonoscopy date will be                            determined after pathology results from today's                            exam become available for review. [name] L. Loletha Carrow, MD 09/09/2022 9:47:52 AM This report has been signed electronically.

## 2022-09-09 NOTE — Patient Instructions (Addendum)
Resume previous diet. Continue present medications. Await pathology results. Repeat colonoscopy is recommended for surveillance. The colonoscopy date will be determined after pathology results from today's exam become available for review.  YOU HAD AN ENDOSCOPIC PROCEDURE TODAY AT THE [facility]:   Refer to the procedure report that was given to you for any specific questions about what was found during the examination.  If the procedure report does not answer your questions, please call your gastroenterologist to clarify.  If you requested that your care partner not be given the details of your procedure findings, then the procedure report has been included in a sealed envelope for you to review at your convenience later.  YOU SHOULD EXPECT: Some feelings of bloating in the abdomen. Passage of more gas than usual.  Walking can help get rid of the air that was put into your GI tract during the procedure and reduce the bloating. If you had a lower endoscopy (such as a colonoscopy or flexible sigmoidoscopy) you may notice spotting of blood in your stool or on the toilet paper. If you underwent a bowel prep for your procedure, you may not have a normal bowel movement for a few days.  Please Note:  You might notice some irritation and congestion in your nose or some drainage.  This is from the oxygen used during your procedure.  There is no need for concern and it should clear up in a day or so.  SYMPTOMS TO REPORT IMMEDIATELY:  Following lower endoscopy (colonoscopy or flexible sigmoidoscopy):  Excessive amounts of blood in the stool  Significant tenderness or worsening of abdominal pains  Swelling of the abdomen that is new, acute  Fever of 100F or higher   For urgent or emergent issues, a gastroenterologist can be reached at any hour by calling [phone]. Do not use MyChart messaging for urgent concerns.    DIET:  We do recommend a small meal at first, but then you may  proceed to your regular diet.  Drink plenty of fluids but you should avoid alcoholic beverages for 24 hours.  ACTIVITY:  You should plan to take it easy for the rest of today and you should NOT DRIVE or use heavy machinery until tomorrow (because of the sedation medicines used during the test).    FOLLOW UP: Our staff will call the number listed on your records the next business day following your procedure.  We will call around 7:15- 8:00 am to check on you and address any questions or concerns that you may have regarding the information given to you following your procedure. If we do not reach you, we will leave a message.     If any biopsies were taken you will be contacted by phone or by letter within the next 1-3 weeks.  Please call us at [phone] if you have not heard about the biopsies in 3 weeks.    SIGNATURES/CONFIDENTIALITY: You and/or your care partner have signed paperwork which will be entered into your electronic medical record.  These signatures attest to the fact that that the information above on your After Visit Summary has been reviewed and is understood.  Full responsibility of the confidentiality of this discharge information lies with you and/or your care-partner. 

## 2022-09-09 NOTE — Progress Notes (Signed)
Report given to PACU, vss 

## 2022-09-09 NOTE — Progress Notes (Signed)
Pt's states no medical or surgical changes since previsit or office visit. 

## 2022-09-09 NOTE — Progress Notes (Signed)
History and Physical:  This patient presents for endoscopic testing for: Encounter Diagnosis  Name Primary?   History of colon polyps Yes    Diminutive TA polyp Aug 2016 Patient denies chronic abdominal pain, rectal bleeding, constipation or diarrhea.   Patient is otherwise without complaints or active issues today.   Past Medical History: Past Medical History:  Diagnosis Date   Arthritis    Cancer ([facility]) 12/2021   prostate   Colon polyps    Gout attack    Hyperlipidemia    Hypertension      Past Surgical History: Past Surgical History:  Procedure Laterality Date   COLONOSCOPY  03/2015   FRACTURE SURGERY  08/02/1998   LEFT ankle, ORIF   LYMPH NODE DISSECTION Bilateral 01/08/2022   Procedure: LYMPH NODE DISSECTION;  Surgeon: Alexis Frock, MD;  Location: [facility];  Service: Urology;  Laterality: Bilateral;   ROBOT ASSISTED LAPAROSCOPIC RADICAL PROSTATECTOMY N/A 01/08/2022   Procedure: XI ROBOTIC ASSISTED LAPAROSCOPIC RADICAL PROSTATECTOMY AND INDOCYANINE GREEN DYE;  Surgeon: Alexis Frock, MD;  Location: [facility];  Service: Urology;  Laterality: N/A;   WISDOM TOOTH EXTRACTION      Allergies: Allergies  Allergen Reactions   Gadolinium Derivatives Hives    11/18/17 developed 4-5 hives on neck and face; no other complaints.  Treated with Benadryl '25mg'$  PO (pt given 2 more caps to take with him for 3.5 mile drive home).  Needs Benadryl '50mg'$  PO pre-med prior to next MRI w/ gad, per Dr. Nelson Chimes.   Methocarbamol Hives    Outpatient Meds: Current Outpatient Medications  Medication Sig Dispense Refill   allopurinol (ZYLOPRIM) 100 MG tablet Take 1 tablet (100 mg total) by mouth daily. 90 tablet 2   amLODipine (NORVASC) 5 MG tablet Take 1 tablet (5 mg total) by mouth daily. 90 tablet 2   aspirin EC 81 MG tablet Take 81 mg by mouth daily. Swallow whole.     atorvastatin (LIPITOR) 20 MG tablet take 1 tablet by mouth at bedtime 90 tablet 2   indomethacin (INDOCIN) 50 MG  capsule Take 1 capsule (50 mg total) by mouth 3 (three) times daily as needed. for MILD PAIN(GOUT) 60 capsule 1   lisinopril (ZESTRIL) 20 MG tablet Take 1 tablet (20 mg total) by mouth daily. 90 tablet 2   docusate sodium (COLACE) 100 MG capsule Take 1 capsule (100 mg total) by mouth 2 (two) times daily.     ferrous sulfate 325 (65 FE) MG tablet Take 325 mg by mouth daily with breakfast.     HYDROcodone-acetaminophen (NORCO) 5-325 MG tablet Take 1-2 tablets by mouth every 6 (six) hours as needed for moderate pain or severe pain. 20 tablet 0   Current Facility-Administered Medications  Medication Dose Route Frequency Provider Last Rate Last Admin   0.9 %  sodium chloride infusion  500 mL Intravenous Once Doran Stabler, MD          ___________________________________________________________________ Objective   Exam:  BP (!) 150/94   Pulse 86   Temp (!) 97.3 F (36.3 C)   Resp 16   Ht '5\' 11"'$  (1.803 m)   Wt 209 lb (94.8 kg)   SpO2 99%   BMI 29.15 kg/m   CV: regular , S1/S2 Resp: clear to auscultation bilaterally, normal RR and effort noted GI: soft, no tenderness, with active bowel sounds.   Assessment: Encounter Diagnosis  Name Primary?   History of colon polyps Yes     Plan: Colonoscopy  The benefits  and risks of the planned procedure were described in detail with the patient or (when appropriate) their health care proxy.  Risks were outlined as including, but not limited to, bleeding, infection, perforation, adverse medication reaction leading to cardiac or pulmonary decompensation, pancreatitis (if ERCP).  The limitation of incomplete mucosal visualization was also discussed.  No guarantees or warranties were given.    The patient is appropriate for an endoscopic procedure in the ambulatory setting.   - Wilfrid Lund, MD

## 2022-09-09 NOTE — Progress Notes (Signed)
Called to room to assist during endoscopic procedure.  Patient ID and intended procedure confirmed with present staff. Received instructions for my participation in the procedure from the performing physician.  

## 2022-09-10 ENCOUNTER — Telehealth: Payer: Self-pay

## 2022-09-10 NOTE — Telephone Encounter (Signed)
  Follow up Call-     09/09/2022    8:38 AM  Call back number  Post procedure Call Back phone  # [phone]  Permission to leave phone message Yes     Patient questions:  Do you have a fever, pain , or abdominal swelling? No. Pain Score  0 *  Have you tolerated food without any problems? Yes.    Have you been able to return to your normal activities? Yes.    Do you have any questions about your discharge instructions: Diet   No. Medications  No. Follow up visit  No.  Do you have questions or concerns about your Care? No.  Actions: * If pain score is 4 or above: No action needed, pain <4.

## 2022-09-13 ENCOUNTER — Ambulatory Visit: Admitting: Family Medicine

## 2022-09-14 ENCOUNTER — Encounter: Payer: Self-pay | Admitting: Gastroenterology

## 2022-09-16 ENCOUNTER — Ambulatory Visit: Admitting: Family Medicine

## 2022-09-16 ENCOUNTER — Encounter: Payer: Self-pay | Admitting: Family Medicine

## 2022-09-16 VITALS — BP 130/76 | HR 78 | Temp 98.3°F | Ht 71.0 in | Wt 195.8 lb

## 2022-09-16 DIAGNOSIS — E785 Hyperlipidemia, unspecified: Secondary | ICD-10-CM | POA: Diagnosis not present

## 2022-09-16 DIAGNOSIS — R7303 Prediabetes: Secondary | ICD-10-CM | POA: Diagnosis not present

## 2022-09-16 DIAGNOSIS — I10 Essential (primary) hypertension: Secondary | ICD-10-CM | POA: Diagnosis not present

## 2022-09-16 DIAGNOSIS — M109 Gout, unspecified: Secondary | ICD-10-CM

## 2022-09-16 MED ORDER — LISINOPRIL 20 MG PO TABS: 20.0000 mg | ORAL_TABLET | Freq: Every day | ORAL | 2 refills | Status: DC

## 2022-09-16 MED ORDER — AMLODIPINE BESYLATE 5 MG PO TABS: 5.0000 mg | ORAL_TABLET | Freq: Every day | ORAL | 2 refills | Status: DC

## 2022-09-16 MED ORDER — ATORVASTATIN CALCIUM 20 MG PO TABS: ORAL_TABLET | ORAL | 2 refills | Status: DC

## 2022-09-16 MED ORDER — ALLOPURINOL 100 MG PO TABS: 100.0000 mg | ORAL_TABLET | Freq: Every day | ORAL | 2 refills | Status: DC

## 2022-09-16 NOTE — Assessment & Plan Note (Signed)
With hypertriglyceridemia, thought to be possibly due to alcohol intake previously, has decreased alcohol use, commended on his efforts, has resources from last visit if difficulty cutting back.  Check labs, continue atorvastatin same dose for now with plan adjustments accordingly depending on results.

## 2022-09-16 NOTE — Assessment & Plan Note (Signed)
Asymptomatic, continue allopurinol for prevention, has indomethacin if needed for flare.  RTC precautions

## 2022-09-16 NOTE — Assessment & Plan Note (Signed)
Stable, tolerating current regimen. Medications refilled. Labs pending as above.  Commended on decreased alcohol use.

## 2022-09-16 NOTE — Assessment & Plan Note (Signed)
Anticipate improved A1c with weight loss, positive health changes.  Commended on these changes.  Recheck 6 months.

## 2022-09-16 NOTE — Patient Instructions (Signed)
Keep up the good work!  I will check your labs today.  If triglycerides are still significantly elevated we can discuss other treatments.  No med changes for now.  Take care!

## 2022-09-16 NOTE — Progress Notes (Signed)
Subjective:  Patient ID: Daniel Loges., male    DOB: 12/06/1955  Age: 67 y.o. MRN: [id number]  CC:  Chief Complaint  Patient presents with   Hyperlipidemia   Hypertension   Prediabetes   Gout    HPI Daniel Mixell. presents for  Follow-up  Stress/adjustment disorder discussed at his August visit.  Felt to be due to some of the stress with prostate cancer treatment, declined counseling at that time.  Was able to talk to friends and was walking for exercise. Mood has been doing better. No need for counseling.   Hypertension: Lisinopril 20 mg daily, amlodipine 5 mg daily.  Slight elevated reading at time of colonoscopy, controlled in August and today. Few polyps, repeat 7 years.  Alcohol use discussed at last visit, cutting back.  Resources provided if he was having further difficulty in cutting back on alcohol use. Still cutting back. 2 per day on average. No difficulty cutting back.   Home readings: 130/70 range.  BP Readings from Last 3 Encounters:  09/16/22 130/76  09/09/22 (!) 151/91  03/26/22 132/86   Lab Results  Component Value Date   CREATININE 0.78 01/09/2022    Hyperlipidemia: Lipitor 20 mg daily. No new myalgia/side effects.   Discussed hypertriglyceridemia last visit, possible alcohol component.  As above, cut back.   Lab Results  Component Value Date   CHOL 130 03/26/2022   HDL 70.90 03/26/2022   LDLCALC 54 09/12/2020   LDLDIRECT 40.0 03/26/2022   TRIG (H) 03/26/2022    478.0 Triglyceride is over 400; calculations on Lipids are invalid.   CHOLHDL 2 03/26/2022   Lab Results  Component Value Date   ALT 16 09/14/2021   AST 49 (H) 09/14/2021   ALKPHOS 65 09/14/2021   BILITOT 0.4 09/14/2021    Gout: Last flare:none recent.  Daily meds: Allopurinol 100 mg daily. Prn med: Indomethacin if needed. Lab Results  Component Value Date   LABURIC 5.6 03/26/2020   Prediabetes: Weight has improved.  No current meds. More walking, watching diet -  avoiding some carbs Weight 201 at my last visit in August.  Lab Results  Component Value Date   HGBA1C 5.9 (H) 12/30/2021   Wt Readings from Last 3 Encounters:  09/16/22 195 lb 12.8 oz (88.8 kg)  09/09/22 209 lb (94.8 kg)  08/19/22 209 lb (94.8 kg)    History Patient Active Problem List   Diagnosis Date Noted   Hyperlipidemia 09/16/2022   Prediabetes 09/16/2022   Prostate cancer ([place]) 01/08/2022   Nonspecific abnormal finding in stool contents 02/28/2015   Family history of cancer 02/28/2015   OA (osteoarthritis) 02/28/2012   Essential hypertension 11/27/2011   Lipid disorder 11/27/2011   Gout of multiple sites 11/27/2011    Past Medical History:  Diagnosis Date   Arthritis    Cancer ([place]) 12/2021   prostate   Colon polyps    Gout attack    Hyperlipidemia    Hypertension       Review of Systems  Constitutional:  Negative for fatigue and unexpected weight change.  Eyes:  Negative for visual disturbance.  Respiratory:  Negative for cough, chest tightness and shortness of breath.   Cardiovascular:  Negative for chest pain, palpitations and leg swelling.  Gastrointestinal:  Negative for abdominal pain and blood in stool.  Neurological:  Negative for dizziness, light-headedness and headaches.     Objective:   Vitals:   09/16/22 0819  BP: 130/76  Pulse: 78  Temp:  98.3 F (36.8 C)  TempSrc: Temporal  SpO2: 99%  Weight: 195 lb 12.8 oz (88.8 kg)  Height: 5' 11"$  (1.803 m)     Physical Exam Vitals reviewed.  Constitutional:      Appearance: He is well-developed.  HENT:     Head: Normocephalic and atraumatic.  Neck:     Vascular: No carotid bruit or JVD.  Cardiovascular:     Rate and Rhythm: Normal rate and regular rhythm.     Heart sounds: Normal heart sounds. No murmur heard. Pulmonary:     Effort: Pulmonary effort is normal.     Breath sounds: Normal breath sounds. No rales.  Musculoskeletal:     Right lower leg: No edema.     Left lower leg: No  edema.  Skin:    General: Skin is warm and dry.  Neurological:     Mental Status: He is alert and oriented to person, place, and time.  Psychiatric:        Mood and Affect: Mood normal.        Assessment & Plan:  Daniel Gipp. is a 67 y.o. male . Prediabetes Assessment & Plan: Anticipate improved A1c with weight loss, positive health changes.  Commended on these changes.  Recheck 6 months.  Orders: -     Hemoglobin A1c  Essential hypertension Assessment & Plan:  Stable, tolerating current regimen. Medications refilled. Labs pending as above.  Commended on decreased alcohol use.   Orders: -     amLODIPine Besylate; Take 1 tablet (5 mg total) by mouth daily.  Dispense: 90 tablet; Refill: 2 -     Lisinopril; Take 1 tablet (20 mg total) by mouth daily.  Dispense: 90 tablet; Refill: 2 -     Comprehensive metabolic panel  Gout of multiple sites, unspecified cause, unspecified chronicity Assessment & Plan: Asymptomatic, continue allopurinol for prevention, has indomethacin if needed for flare.  RTC precautions  Orders: -     Allopurinol; Take 1 tablet (100 mg total) by mouth daily.  Dispense: 90 tablet; Refill: 2  Hyperlipidemia, unspecified hyperlipidemia type Assessment & Plan: With hypertriglyceridemia, thought to be possibly due to alcohol intake previously, has decreased alcohol use, commended on his efforts, has resources from last visit if difficulty cutting back.  Check labs, continue atorvastatin same dose for now with plan adjustments accordingly depending on results.  Orders: -     Atorvastatin Calcium; take 1 tablet by mouth at bedtime  Dispense: 90 tablet; Refill: 2 -     Lipid panel    Patient Instructions  Keep up the good work!  I will check your labs today.  If triglycerides are still significantly elevated we can discuss other treatments.  No med changes for now.  Take care!    Signed,   Merri Ray, MD [facility] 09/16/22 8:46 AM

## 2022-11-04 NOTE — Progress Notes (Signed)
GU Location of Tumor / Histology: Prostate Ca  If Prostate Cancer, Gleason Score is (3 + 4) and PSA is (0.12 on 10/24/2021).  01/08/2022 Prostatectomy with bilateral lymph node dissection   10/29/2021 Dr. Karoline Caldwell NM PET (PSMA) Skull to Mid Thigh CLINICAL DATA:  Initial staging prostate carcinoma.   Narrative & Impression  CLINICAL DATA:  Initial staging prostate carcinoma.   EXAM: NUCLEAR MEDICINE PET SKULL BASE TO THIGH   TECHNIQUE: 8.2 mCi F18 Piflufolastat (Pylarify) was injected intravenously. Full-ring PET imaging was performed from the skull base to thigh after the radiotracer. CT data was obtained and used for attenuation correction and anatomic localization.   COMPARISON:  CT abdomen 02/23/2021   FINDINGS: NECK   No radiotracer activity in neck lymph nodes.   Incidental CT finding: None   CHEST   No radiotracer accumulation within mediastinal or hilar lymph nodes. No suspicious pulmonary nodules on the CT scan.   Incidental CT finding: None   ABDOMEN/PELVIS   Prostate: Fairly intense activity in the posterior LEFT aspect of the prostate gland SUV max equal 6 3.   Lymph nodes: No abnormal radiotracer accumulation within pelvic or abdominal nodes.   Liver: No evidence of liver metastasis   Incidental CT finding: None   SKELETON   No focal activity to suggest skeletal metastasis. Degenerative osteophytosis of the spine.    IMPRESSION: 1. Focal activity LEFT lobe of the prostate gland most consistent with prostate adenocarcinoma. 2. No evidence metastatic adenopathy in the pelvis or periaortic retroperitoneum. 3. No evidence of visceral metastasis or skeletal metastasis.  Past/Anticipated interventions by urology, if any: No  Past/Anticipated interventions by medical oncology, if any: NA  Weight changes, if any:  No is trying to keep weight down because of cholesterol  IPSS:  4 SHIM:  21  Bowel/Bladder complaints, if any:   No  Nausea/Vomiting, if any: No  Pain issues, if any:  0/10  SAFETY ISSUES: Prior radiation?  No Pacemaker/ICD? No Possible current pregnancy? Male Is the patient on methotrexate? No  Current Complaints / other details:

## 2022-11-05 NOTE — Progress Notes (Signed)
Radiation Oncology         ([phone]) [phone] ________________________________  Initial Outpatient Consultation  Name: Daniel Davis. MRN: [id number]  Date: 11/08/2022  DOB: Jan 02, 1956  BM:[id number], Asencion Partridge, MD  Sebastian Ache, MD   REFERRING PHYSICIAN: Sebastian Ache, MD  DIAGNOSIS: 67 y.o. gentleman with a rising PSA of 0.12 s/p RRP 12/2021 for pT3aN0, Gleason score 4+3   No diagnosis found.  HISTORY OF PRESENT ILLNESS: Daniel Davis. is a 67 y.o. male with a diagnosis of prostate cancer. He was first seen by Dr. Benancio Deeds for rising PSA. Biopsies of the prostate on 01/11/22 showed maximum Gleason score was 4+3. PSMA PET on 10/29/21 showed focal activity in the left lobe of the prostate gland with no evidence of metastatic disease. Patient decided to proceed with robotic radical prostatectomy with ICG sentinel and template node dissection for pT3aN0 Grade 3 prostate cancer under the care of Dr. Berneice Heinrich on 01/08/22. Pathology revealed maximum Gleason core of 4+3 with extraprostatic extension at the right bladder neck and a positive margin at the right bladder neck area (2 mm, Gleason score 3+3). All lymph nodes sampled were benign.   Post-op PSA obtained 04/2022 was 0.114. Repeat PSA on 10/3022 was 0.12.  The patient reviewed the PSA results with his urologist and he has kindly been referred today for discussion of potential radiation treatment options.   PREVIOUS RADIATION THERAPY: {EXAM; YES/NO:19492::"No"}  PAST MEDICAL HISTORY:  Past Medical History:  Diagnosis Date   Arthritis    Cancer ([facility]) 12/2021   prostate   Colon polyps    Gout attack    Hyperlipidemia    Hypertension       PAST SURGICAL HISTORY: Past Surgical History:  Procedure Laterality Date   COLONOSCOPY  03/2015   FRACTURE SURGERY  08/02/1998   LEFT ankle, ORIF   LYMPH NODE DISSECTION Bilateral 01/08/2022   Procedure: LYMPH NODE DISSECTION;  Surgeon: Sebastian Ache, MD;  Location: [facility];  Service: Urology;   Laterality: Bilateral;   ROBOT ASSISTED LAPAROSCOPIC RADICAL PROSTATECTOMY N/A 01/08/2022   Procedure: XI ROBOTIC ASSISTED LAPAROSCOPIC RADICAL PROSTATECTOMY AND INDOCYANINE GREEN DYE;  Surgeon: Sebastian Ache, MD;  Location: [facility];  Service: Urology;  Laterality: N/A;   WISDOM TOOTH EXTRACTION      FAMILY HISTORY:  Family History  Problem Relation Age of Onset   Diabetes Mother    Colon cancer Mother 57   Hypertension Mother    Hypertension Father    Heart disease Father    Stroke Father     SOCIAL HISTORY:  Social History   Socioeconomic History   Marital status: Divorced    Spouse name: Nelma Rothman   Number of children: 1   Years of education: 14+   Highest education level: Not on file  Occupational History   Occupation: SHIFT SUPERVISOR    Employer: [employer]  Tobacco Use   Smoking status: Never   Smokeless tobacco: Never  Vaping Use   Vaping Use: Never used  Substance and Sexual Activity   Alcohol use: Yes    Alcohol/week: 3.0 standard drinks of alcohol    Types: 3 Standard drinks or equivalent per week    Comment: 3-5 drinks   Drug use: No   Sexual activity: Yes    Partners: Female  Other Topics Concern   Not on file  Social History Narrative   Lives with his wife and their daughter.   Exercise: Walking   Education: some [employer]  Determinants of Health   Financial Resource Strain: Not on file  Food Insecurity: Not on file  Transportation Needs: Not on file  Physical Activity: Not on file  Stress: Not on file  Social Connections: Not on file  Intimate Partner Violence: Not on file    ALLERGIES: Gadolinium derivatives and Methocarbamol  MEDICATIONS:  Current Outpatient Medications  Medication Sig Dispense Refill   allopurinol (ZYLOPRIM) 100 MG tablet Take 1 tablet (100 mg total) by mouth daily. 90 tablet 2   amLODipine (NORVASC) 5 MG tablet Take 1 tablet (5 mg total) by mouth daily. 90 tablet 2   aspirin EC 81 MG tablet Take 81 mg by  mouth daily. Swallow whole.     atorvastatin (LIPITOR) 20 MG tablet take 1 tablet by mouth at bedtime 90 tablet 2   docusate sodium (COLACE) 100 MG capsule Take 1 capsule (100 mg total) by mouth 2 (two) times daily.     ferrous sulfate 325 (65 FE) MG tablet Take 325 mg by mouth daily with breakfast.     HYDROcodone-acetaminophen (NORCO) 5-325 MG tablet Take 1-2 tablets by mouth every 6 (six) hours as needed for moderate pain or severe pain. 20 tablet 0   indomethacin (INDOCIN) 50 MG capsule Take 1 capsule (50 mg total) by mouth 3 (three) times daily as needed. for MILD PAIN(GOUT) 60 capsule 1   lisinopril (ZESTRIL) 20 MG tablet Take 1 tablet (20 mg total) by mouth daily. 90 tablet 2   No current facility-administered medications for this visit.    REVIEW OF SYSTEMS:  On review of systems, the patient reports that he is doing well overall. He denies any chest pain, shortness of breath, cough, fevers, chills, night sweats, unintended weight changes. He denies any bowel disturbances, and denies abdominal pain, nausea or vomiting. He denies any new musculoskeletal or joint aches or pains. His IPSS was  , indicating *** urinary symptoms (Reference 0-7 mild, 8-19 moderate, 20-35 severe).  His SHIM was  , indicating he {does not have/has mild/moderate/severe} erectile dysfunction (Reference - 22-25 None, 17-21 Mild, 8-16 Moderate, 1-7 Severe). A complete review of systems is obtained and is otherwise negative.   PHYSICAL EXAM:  Wt Readings from Last 3 Encounters:  09/16/22 195 lb 12.8 oz (88.8 kg)  09/09/22 209 lb (94.8 kg)  08/19/22 209 lb (94.8 kg)   Temp Readings from Last 3 Encounters:  09/16/22 98.3 F (36.8 C) (Temporal)  09/09/22 (!) 97.3 F (36.3 C)  03/26/22 97.9 F (36.6 C)   BP Readings from Last 3 Encounters:  09/16/22 130/76  09/09/22 (!) 151/91  03/26/22 132/86   Pulse Readings from Last 3 Encounters:  09/16/22 78  09/09/22 89  03/26/22 77    /10  In general this is a  well appearing gentleman in no acute distress. He's alert and oriented x4 and appropriate throughout the examination. Cardiopulmonary assessment is negative for acute distress, and he exhibits normal effort.    KPS = ***  100 - Normal; no complaints; no evidence of disease. 90   - Able to carry on normal activity; minor signs or symptoms of disease. 80   - Normal activity with effort; some signs or symptoms of disease. 51   - Cares for self; unable to carry on normal activity or to do active work. 60   - Requires occasional assistance, but is able to care for most of his personal needs. 50   - Requires considerable assistance and frequent medical care. 40   -  Disabled; requires special care and assistance. 30   - Severely disabled; hospital admission is indicated although death not imminent. 20   - Very sick; hospital admission necessary; active supportive treatment necessary. 10   - Moribund; fatal processes progressing rapidly. 0     - Dead  Karnofsky DA, Abelmann WH, Craver LS and Burchenal [facility] [phone](1948) The use of the nitrogen mustards in the palliative treatment of carcinoma: with particular reference to bronchogenic carcinoma Cancer 1 634-56  LABORATORY DATA:  Lab Results  Component Value Date   WBC 6.1 12/30/2021   HGB 11.3 (L) 01/09/2022   HCT 33.3 (L) 01/09/2022   MCV 89.8 12/30/2021   PLT 229 12/30/2021   Lab Results  Component Value Date   NA 136 01/09/2022   K 3.8 01/09/2022   CL 106 01/09/2022   CO2 24 01/09/2022   Lab Results  Component Value Date   ALT 16 09/14/2021   AST 49 (H) 09/14/2021   ALKPHOS 65 09/14/2021   BILITOT 0.4 09/14/2021     RADIOGRAPHY: No results found.    IMPRESSION/PLAN: 1. 67 y.o. gentleman with a rising PSA of 0.12 s/p RRP 12/2021 for pT3aN0, Gleason score 4+3   Today we reviewed the findings and workup thus far.  We discussed the natural history of prostate cancer.  We reviewed the the implications of positive margins, extracapsular  extension, and seminal vesicle involvement on the risk of prostate cancer recurrence. In his case, extracapsular and close margins were present. We reviewed some of the evidence suggesting an advantage for patients who undergo adjuvant radiotherapy in the setting in terms of disease control and overall survival. We discussed radiation treatment directed to the prostatic fossa with regard to the logistics and delivery of external beam radiation treatment. He appears to have a good understanding of his disease and our treatment recommendations which are of curative intent.  He was encouraged to ask questions that were answered to his stated satisfaction.  At the conclusion of our conversation, the patient is interested in moving forward with ***. 7.5 weeks of radiation treatment.   We personally spent *** minutes in this encounter including chart review, reviewing radiological studies, meeting face-to-face with the patient, entering orders and completing documentation.      Margaretmary DysMatthew Esmae Donathan, MD  [facility]  Radiation Oncology Direct Dial: [phone]  Fax: [phone] [email or web address]  Skype  LinkedIn

## 2022-11-08 ENCOUNTER — Ambulatory Visit
Admission: RE | Admit: 2022-11-08 | Discharge: 2022-11-08 | Disposition: A | Discharge status: Home / Self Care | Source: Ambulatory Visit | Attending: Radiation Oncology | Admitting: Radiation Oncology

## 2022-11-08 ENCOUNTER — Encounter: Payer: Self-pay | Admitting: Radiation Oncology

## 2022-11-08 ENCOUNTER — Other Ambulatory Visit: Payer: Self-pay

## 2022-11-08 VITALS — BP 118/78 | HR 84 | Temp 99.3°F | Resp 18 | Ht 71.0 in | Wt 194.6 lb

## 2022-11-08 DIAGNOSIS — Z7982 Long term (current) use of aspirin: Secondary | ICD-10-CM | POA: Insufficient documentation

## 2022-11-08 DIAGNOSIS — E785 Hyperlipidemia, unspecified: Secondary | ICD-10-CM | POA: Insufficient documentation

## 2022-11-08 DIAGNOSIS — C61 Malignant neoplasm of prostate: Secondary | ICD-10-CM | POA: Diagnosis present

## 2022-11-08 DIAGNOSIS — Z79899 Other long term (current) drug therapy: Secondary | ICD-10-CM | POA: Insufficient documentation

## 2022-11-08 DIAGNOSIS — Z800 Family history of malignant neoplasm of digestive organs: Secondary | ICD-10-CM | POA: Diagnosis not present

## 2022-11-08 DIAGNOSIS — M129 Arthropathy, unspecified: Secondary | ICD-10-CM | POA: Insufficient documentation

## 2022-11-08 DIAGNOSIS — I10 Essential (primary) hypertension: Secondary | ICD-10-CM | POA: Insufficient documentation

## 2022-11-08 DIAGNOSIS — Z86010 Personal history of colonic polyps: Secondary | ICD-10-CM | POA: Diagnosis not present

## 2022-11-08 NOTE — Progress Notes (Signed)
Introduced myself to the patient as the prostate nurse navigator.  No barriers to care identified at this time.  He is here to discuss his radiation treatment options, and had prostatectomy 01/08/2022.  I gave him my business card and asked him to call me with questions or concerns.  Verbalized understanding.

## 2022-11-12 ENCOUNTER — Ambulatory Visit
Admission: RE | Admit: 2022-11-12 | Discharge: 2022-11-12 | Disposition: A | Discharge status: Home / Self Care | Source: Ambulatory Visit | Attending: Radiation Oncology | Admitting: Radiation Oncology

## 2022-11-12 DIAGNOSIS — Z510 Encounter for antineoplastic radiation therapy: Secondary | ICD-10-CM | POA: Insufficient documentation

## 2022-11-12 DIAGNOSIS — C61 Malignant neoplasm of prostate: Secondary | ICD-10-CM | POA: Diagnosis present

## 2022-11-12 NOTE — Progress Notes (Signed)
  Radiation Oncology         [phone] ________________________________  Name: Daniel Davis. MRN: [id number]  Date: 11/12/2022  DOB: 1956-01-14  SIMULATION AND TREATMENT PLANNING NOTE    ICD-10-CM   1. Prostate cancer  C61       DIAGNOSIS:  67 y.o. gentleman with a rising PSA of 0.12 s/p RRP 12/2021 for pT3aN0, Gleason score 4+3   NARRATIVE:  The patient was brought to the CT Simulation planning suite.  Identity was confirmed.  All relevant records and images related to the planned course of therapy were reviewed.  The patient freely provided informed written consent to proceed with treatment after reviewing the details related to the planned course of therapy. The consent form was witnessed and verified by the simulation staff.  Then, the patient was set-up in a stable reproducible supine position for radiation therapy.  A vacuum lock pillow device was custom fabricated to position his legs in a reproducible immobilized position.  Then, I performed a urethrogram under sterile conditions to identify the prostatic apex.  CT images were obtained.  Surface markings were placed.  The CT images were loaded into the planning software.  Then the prostate target and avoidance structures including the rectum, bladder, bowel and hips were contoured.  Treatment planning then occurred.  The radiation prescription was entered and confirmed.  A total of one complex treatment devices was fabricated. I have requested : Intensity Modulated Radiotherapy (IMRT) is medically necessary for this case for the following reason:  Rectal sparing.Marland Kitchen  PLAN:   The prostate fossa and pelvic lymph nodes will initially be treated to 45 Gy in 25 fractions of 1.8 Gy followed by a boost to the fossa only, to 68.4 Gy with 13 additional fractions of 1.8 Gy   ________________________________  Artist Pais Kathrynn Running, M.D.

## 2022-11-19 NOTE — Progress Notes (Signed)
RN reached out to patient prior to start of radiation.   No answer, no option to leave voicemail.  Will continue to follow.

## 2022-11-22 DIAGNOSIS — Z510 Encounter for antineoplastic radiation therapy: Secondary | ICD-10-CM | POA: Diagnosis not present

## 2022-11-25 ENCOUNTER — Ambulatory Visit

## 2022-11-25 ENCOUNTER — Ambulatory Visit
Admission: RE | Admit: 2022-11-25 | Discharge: 2022-11-25 | Disposition: A | Discharge status: Home / Self Care | Source: Ambulatory Visit | Attending: Radiation Oncology | Admitting: Radiation Oncology

## 2022-11-25 ENCOUNTER — Other Ambulatory Visit: Payer: Self-pay

## 2022-11-25 DIAGNOSIS — Z510 Encounter for antineoplastic radiation therapy: Secondary | ICD-10-CM | POA: Diagnosis not present

## 2022-11-25 LAB — RAD ONC ARIA SESSION SUMMARY
Course Elapsed Days: 0
Plan Fractions Treated to Date: 1
Plan Prescribed Dose Per Fraction: 1.8 Gy
Plan Total Fractions Prescribed: 25
Plan Total Prescribed Dose: 45 Gy
Reference Point Dosage Given to Date: 1.8 Gy
Reference Point Session Dosage Given: 1.8 Gy
Session Number: 1

## 2022-11-25 NOTE — Progress Notes (Signed)
Pt here for patient teaching. Pt given Radiation and You booklet and skin care instructions.  Reviewed areas of pertinence such as diarrhea, fatigue, hair loss, nausea and vomiting, sexual and fertility changes, skin changes, urinary and bladder changes, and taste changes. Pt able to give teach back of to pat skin, use unscented/gentle soap, use baby wipes, have Imodium on hand, drink plenty of water, and sitz bath, avoid applying anything to skin within 4 hours of treatment and to use an electric razor if they must shave. Pt verbalizes understanding of information given and will contact nursing with any questions or concerns.     Http://rtanswers.org/treatmentinformation/whattoexpect/index      

## 2022-11-26 ENCOUNTER — Other Ambulatory Visit: Payer: Self-pay

## 2022-11-26 ENCOUNTER — Ambulatory Visit
Admission: RE | Admit: 2022-11-26 | Discharge: 2022-11-26 | Disposition: A | Discharge status: Home / Self Care | Source: Ambulatory Visit | Attending: Radiation Oncology | Admitting: Radiation Oncology

## 2022-11-26 DIAGNOSIS — Z510 Encounter for antineoplastic radiation therapy: Secondary | ICD-10-CM | POA: Diagnosis not present

## 2022-11-26 LAB — RAD ONC ARIA SESSION SUMMARY
Course Elapsed Days: 1
Plan Fractions Treated to Date: 2
Plan Prescribed Dose Per Fraction: 1.8 Gy
Plan Total Fractions Prescribed: 25
Plan Total Prescribed Dose: 45 Gy
Reference Point Dosage Given to Date: 3.6 Gy
Reference Point Session Dosage Given: 1.8 Gy
Session Number: 2

## 2022-11-29 ENCOUNTER — Ambulatory Visit
Admission: RE | Admit: 2022-11-29 | Discharge: 2022-11-29 | Disposition: A | Discharge status: Home / Self Care | Source: Ambulatory Visit | Attending: Radiation Oncology | Admitting: Radiation Oncology

## 2022-11-29 ENCOUNTER — Other Ambulatory Visit: Payer: Self-pay

## 2022-11-29 DIAGNOSIS — Z510 Encounter for antineoplastic radiation therapy: Secondary | ICD-10-CM | POA: Diagnosis not present

## 2022-11-29 LAB — RAD ONC ARIA SESSION SUMMARY
Course Elapsed Days: 4
Plan Fractions Treated to Date: 3
Plan Prescribed Dose Per Fraction: 1.8 Gy
Plan Total Fractions Prescribed: 25
Plan Total Prescribed Dose: 45 Gy
Reference Point Dosage Given to Date: 5.4 Gy
Reference Point Session Dosage Given: 1.8 Gy
Session Number: 3

## 2022-11-30 ENCOUNTER — Ambulatory Visit
Admission: RE | Admit: 2022-11-30 | Discharge: 2022-11-30 | Disposition: A | Discharge status: Home / Self Care | Source: Ambulatory Visit | Attending: Radiation Oncology | Admitting: Radiation Oncology

## 2022-11-30 ENCOUNTER — Other Ambulatory Visit: Payer: Self-pay

## 2022-11-30 DIAGNOSIS — Z510 Encounter for antineoplastic radiation therapy: Secondary | ICD-10-CM | POA: Diagnosis not present

## 2022-11-30 LAB — RAD ONC ARIA SESSION SUMMARY
Course Elapsed Days: 5
Plan Fractions Treated to Date: 4
Plan Prescribed Dose Per Fraction: 1.8 Gy
Plan Total Fractions Prescribed: 25
Plan Total Prescribed Dose: 45 Gy
Reference Point Dosage Given to Date: 7.2 Gy
Reference Point Session Dosage Given: 1.8 Gy
Session Number: 4

## 2022-12-01 ENCOUNTER — Other Ambulatory Visit: Payer: Self-pay

## 2022-12-01 ENCOUNTER — Ambulatory Visit
Admission: RE | Admit: 2022-12-01 | Discharge: 2022-12-01 | Disposition: A | Discharge status: Home / Self Care | Source: Ambulatory Visit | Attending: Radiation Oncology | Admitting: Radiation Oncology

## 2022-12-01 DIAGNOSIS — C61 Malignant neoplasm of prostate: Secondary | ICD-10-CM | POA: Insufficient documentation

## 2022-12-01 DIAGNOSIS — Z510 Encounter for antineoplastic radiation therapy: Secondary | ICD-10-CM | POA: Insufficient documentation

## 2022-12-01 LAB — RAD ONC ARIA SESSION SUMMARY
Course Elapsed Days: 6
Plan Fractions Treated to Date: 5
Plan Prescribed Dose Per Fraction: 1.8 Gy
Plan Total Fractions Prescribed: 25
Plan Total Prescribed Dose: 45 Gy
Reference Point Dosage Given to Date: 9 Gy
Reference Point Session Dosage Given: 1.8 Gy
Session Number: 5

## 2022-12-02 ENCOUNTER — Ambulatory Visit
Admission: RE | Admit: 2022-12-02 | Discharge: 2022-12-02 | Disposition: A | Discharge status: Home / Self Care | Source: Ambulatory Visit | Attending: Radiation Oncology | Admitting: Radiation Oncology

## 2022-12-02 ENCOUNTER — Other Ambulatory Visit: Payer: Self-pay

## 2022-12-02 DIAGNOSIS — Z510 Encounter for antineoplastic radiation therapy: Secondary | ICD-10-CM | POA: Diagnosis not present

## 2022-12-02 LAB — RAD ONC ARIA SESSION SUMMARY
Course Elapsed Days: 7
Plan Fractions Treated to Date: 6
Plan Prescribed Dose Per Fraction: 1.8 Gy
Plan Total Fractions Prescribed: 25
Plan Total Prescribed Dose: 45 Gy
Reference Point Dosage Given to Date: 10.8 Gy
Reference Point Session Dosage Given: 1.8 Gy
Session Number: 6

## 2022-12-03 ENCOUNTER — Ambulatory Visit
Admission: RE | Admit: 2022-12-03 | Discharge: 2022-12-03 | Disposition: A | Discharge status: Home / Self Care | Source: Ambulatory Visit | Attending: Radiation Oncology | Admitting: Radiation Oncology

## 2022-12-03 ENCOUNTER — Other Ambulatory Visit: Payer: Self-pay

## 2022-12-03 DIAGNOSIS — Z510 Encounter for antineoplastic radiation therapy: Secondary | ICD-10-CM | POA: Diagnosis not present

## 2022-12-03 LAB — RAD ONC ARIA SESSION SUMMARY
Course Elapsed Days: 8
Plan Fractions Treated to Date: 7
Plan Prescribed Dose Per Fraction: 1.8 Gy
Plan Total Fractions Prescribed: 25
Plan Total Prescribed Dose: 45 Gy
Reference Point Dosage Given to Date: 12.6 Gy
Reference Point Session Dosage Given: 1.8 Gy
Session Number: 7

## 2022-12-06 ENCOUNTER — Other Ambulatory Visit: Payer: Self-pay

## 2022-12-06 ENCOUNTER — Ambulatory Visit
Admission: RE | Admit: 2022-12-06 | Discharge: 2022-12-06 | Disposition: A | Discharge status: Home / Self Care | Source: Ambulatory Visit | Attending: Radiation Oncology | Admitting: Radiation Oncology

## 2022-12-06 DIAGNOSIS — Z510 Encounter for antineoplastic radiation therapy: Secondary | ICD-10-CM | POA: Diagnosis not present

## 2022-12-06 LAB — RAD ONC ARIA SESSION SUMMARY
Course Elapsed Days: 11
Plan Fractions Treated to Date: 8
Plan Prescribed Dose Per Fraction: 1.8 Gy
Plan Total Fractions Prescribed: 25
Plan Total Prescribed Dose: 45 Gy
Reference Point Dosage Given to Date: 14.4 Gy
Reference Point Session Dosage Given: 1.8 Gy
Session Number: 8

## 2022-12-07 ENCOUNTER — Ambulatory Visit
Admission: RE | Admit: 2022-12-07 | Discharge: 2022-12-07 | Disposition: A | Discharge status: Home / Self Care | Source: Ambulatory Visit | Attending: Radiation Oncology | Admitting: Radiation Oncology

## 2022-12-07 ENCOUNTER — Other Ambulatory Visit: Payer: Self-pay

## 2022-12-07 DIAGNOSIS — Z510 Encounter for antineoplastic radiation therapy: Secondary | ICD-10-CM | POA: Diagnosis not present

## 2022-12-07 LAB — RAD ONC ARIA SESSION SUMMARY
Course Elapsed Days: 12
Plan Fractions Treated to Date: 9
Plan Prescribed Dose Per Fraction: 1.8 Gy
Plan Total Fractions Prescribed: 25
Plan Total Prescribed Dose: 45 Gy
Reference Point Dosage Given to Date: 16.2 Gy
Reference Point Session Dosage Given: 1.8 Gy
Session Number: 9

## 2022-12-08 ENCOUNTER — Other Ambulatory Visit: Payer: Self-pay

## 2022-12-08 ENCOUNTER — Ambulatory Visit
Admission: RE | Admit: 2022-12-08 | Discharge: 2022-12-08 | Disposition: A | Discharge status: Home / Self Care | Source: Ambulatory Visit | Attending: Radiation Oncology | Admitting: Radiation Oncology

## 2022-12-08 DIAGNOSIS — Z510 Encounter for antineoplastic radiation therapy: Secondary | ICD-10-CM | POA: Diagnosis not present

## 2022-12-08 LAB — RAD ONC ARIA SESSION SUMMARY
Course Elapsed Days: 13
Plan Fractions Treated to Date: 10
Plan Prescribed Dose Per Fraction: 1.8 Gy
Plan Total Fractions Prescribed: 25
Plan Total Prescribed Dose: 45 Gy
Reference Point Dosage Given to Date: 18 Gy
Reference Point Session Dosage Given: 1.8 Gy
Session Number: 10

## 2022-12-09 ENCOUNTER — Ambulatory Visit

## 2022-12-10 ENCOUNTER — Other Ambulatory Visit: Payer: Self-pay

## 2022-12-10 ENCOUNTER — Ambulatory Visit
Admission: RE | Admit: 2022-12-10 | Discharge: 2022-12-10 | Disposition: A | Discharge status: Home / Self Care | Source: Ambulatory Visit | Attending: Radiation Oncology | Admitting: Radiation Oncology

## 2022-12-10 DIAGNOSIS — Z510 Encounter for antineoplastic radiation therapy: Secondary | ICD-10-CM | POA: Diagnosis not present

## 2022-12-10 LAB — RAD ONC ARIA SESSION SUMMARY
Course Elapsed Days: 15
Plan Fractions Treated to Date: 11
Plan Prescribed Dose Per Fraction: 1.8 Gy
Plan Total Fractions Prescribed: 25
Plan Total Prescribed Dose: 45 Gy
Reference Point Dosage Given to Date: 19.8 Gy
Reference Point Session Dosage Given: 1.8 Gy
Session Number: 11

## 2022-12-13 ENCOUNTER — Other Ambulatory Visit: Payer: Self-pay

## 2022-12-13 ENCOUNTER — Ambulatory Visit
Admission: RE | Admit: 2022-12-13 | Discharge: 2022-12-13 | Disposition: A | Discharge status: Home / Self Care | Source: Ambulatory Visit | Attending: Radiation Oncology | Admitting: Radiation Oncology

## 2022-12-13 DIAGNOSIS — Z510 Encounter for antineoplastic radiation therapy: Secondary | ICD-10-CM | POA: Diagnosis not present

## 2022-12-13 LAB — RAD ONC ARIA SESSION SUMMARY
Course Elapsed Days: 18
Plan Fractions Treated to Date: 12
Plan Prescribed Dose Per Fraction: 1.8 Gy
Plan Total Fractions Prescribed: 25
Plan Total Prescribed Dose: 45 Gy
Reference Point Dosage Given to Date: 21.6 Gy
Reference Point Session Dosage Given: 1.8 Gy
Session Number: 12

## 2022-12-14 ENCOUNTER — Other Ambulatory Visit: Payer: Self-pay

## 2022-12-14 ENCOUNTER — Ambulatory Visit
Admission: RE | Admit: 2022-12-14 | Discharge: 2022-12-14 | Disposition: A | Discharge status: Home / Self Care | Source: Ambulatory Visit | Attending: Radiation Oncology | Admitting: Radiation Oncology

## 2022-12-14 DIAGNOSIS — Z510 Encounter for antineoplastic radiation therapy: Secondary | ICD-10-CM | POA: Diagnosis not present

## 2022-12-14 LAB — RAD ONC ARIA SESSION SUMMARY
Course Elapsed Days: 19
Plan Fractions Treated to Date: 13
Plan Prescribed Dose Per Fraction: 1.8 Gy
Plan Total Fractions Prescribed: 25
Plan Total Prescribed Dose: 45 Gy
Reference Point Dosage Given to Date: 23.4 Gy
Reference Point Session Dosage Given: 1.8 Gy
Session Number: 13

## 2022-12-15 ENCOUNTER — Ambulatory Visit
Admission: RE | Admit: 2022-12-15 | Discharge: 2022-12-15 | Disposition: A | Discharge status: Home / Self Care | Source: Ambulatory Visit | Attending: Radiation Oncology | Admitting: Radiation Oncology

## 2022-12-15 ENCOUNTER — Other Ambulatory Visit: Payer: Self-pay

## 2022-12-15 DIAGNOSIS — Z510 Encounter for antineoplastic radiation therapy: Secondary | ICD-10-CM | POA: Diagnosis not present

## 2022-12-15 LAB — RAD ONC ARIA SESSION SUMMARY
Course Elapsed Days: 20
Plan Fractions Treated to Date: 14
Plan Prescribed Dose Per Fraction: 1.8 Gy
Plan Total Fractions Prescribed: 25
Plan Total Prescribed Dose: 45 Gy
Reference Point Dosage Given to Date: 25.2 Gy
Reference Point Session Dosage Given: 1.8 Gy
Session Number: 14

## 2022-12-16 ENCOUNTER — Ambulatory Visit
Admission: RE | Admit: 2022-12-16 | Discharge: 2022-12-16 | Disposition: A | Discharge status: Home / Self Care | Source: Ambulatory Visit | Attending: Radiation Oncology | Admitting: Radiation Oncology

## 2022-12-16 ENCOUNTER — Other Ambulatory Visit: Payer: Self-pay

## 2022-12-16 DIAGNOSIS — Z510 Encounter for antineoplastic radiation therapy: Secondary | ICD-10-CM | POA: Diagnosis not present

## 2022-12-16 LAB — RAD ONC ARIA SESSION SUMMARY
Course Elapsed Days: 21
Plan Fractions Treated to Date: 15
Plan Prescribed Dose Per Fraction: 1.8 Gy
Plan Total Fractions Prescribed: 25
Plan Total Prescribed Dose: 45 Gy
Reference Point Dosage Given to Date: 27 Gy
Reference Point Session Dosage Given: 1.8 Gy
Session Number: 15

## 2022-12-17 ENCOUNTER — Other Ambulatory Visit: Payer: Self-pay

## 2022-12-17 ENCOUNTER — Ambulatory Visit
Admission: RE | Admit: 2022-12-17 | Discharge: 2022-12-17 | Disposition: A | Discharge status: Home / Self Care | Source: Ambulatory Visit | Attending: Radiation Oncology | Admitting: Radiation Oncology

## 2022-12-17 DIAGNOSIS — Z510 Encounter for antineoplastic radiation therapy: Secondary | ICD-10-CM | POA: Diagnosis not present

## 2022-12-17 LAB — RAD ONC ARIA SESSION SUMMARY
Course Elapsed Days: 22
Plan Fractions Treated to Date: 16
Plan Prescribed Dose Per Fraction: 1.8 Gy
Plan Total Fractions Prescribed: 25
Plan Total Prescribed Dose: 45 Gy
Reference Point Dosage Given to Date: 28.8 Gy
Reference Point Session Dosage Given: 1.8 Gy
Session Number: 16

## 2022-12-20 ENCOUNTER — Other Ambulatory Visit: Payer: Self-pay

## 2022-12-20 ENCOUNTER — Ambulatory Visit
Admission: RE | Admit: 2022-12-20 | Discharge: 2022-12-20 | Disposition: A | Discharge status: Home / Self Care | Source: Ambulatory Visit | Attending: Radiation Oncology | Admitting: Radiation Oncology

## 2022-12-20 DIAGNOSIS — Z510 Encounter for antineoplastic radiation therapy: Secondary | ICD-10-CM | POA: Diagnosis not present

## 2022-12-20 LAB — RAD ONC ARIA SESSION SUMMARY
Course Elapsed Days: 25
Plan Fractions Treated to Date: 17
Plan Prescribed Dose Per Fraction: 1.8 Gy
Plan Total Fractions Prescribed: 25
Plan Total Prescribed Dose: 45 Gy
Reference Point Dosage Given to Date: 30.6 Gy
Reference Point Session Dosage Given: 1.8 Gy
Session Number: 17

## 2022-12-21 ENCOUNTER — Ambulatory Visit
Admission: RE | Admit: 2022-12-21 | Discharge: 2022-12-21 | Disposition: A | Discharge status: Home / Self Care | Source: Ambulatory Visit | Attending: Radiation Oncology | Admitting: Radiation Oncology

## 2022-12-21 ENCOUNTER — Other Ambulatory Visit: Payer: Self-pay

## 2022-12-21 DIAGNOSIS — Z510 Encounter for antineoplastic radiation therapy: Secondary | ICD-10-CM | POA: Diagnosis not present

## 2022-12-21 LAB — RAD ONC ARIA SESSION SUMMARY
Course Elapsed Days: 26
Plan Fractions Treated to Date: 18
Plan Prescribed Dose Per Fraction: 1.8 Gy
Plan Total Fractions Prescribed: 25
Plan Total Prescribed Dose: 45 Gy
Reference Point Dosage Given to Date: 32.4 Gy
Reference Point Session Dosage Given: 1.8 Gy
Session Number: 18

## 2022-12-22 ENCOUNTER — Other Ambulatory Visit: Payer: Self-pay

## 2022-12-22 ENCOUNTER — Ambulatory Visit
Admission: RE | Admit: 2022-12-22 | Discharge: 2022-12-22 | Disposition: A | Discharge status: Home / Self Care | Source: Ambulatory Visit | Attending: Radiation Oncology | Admitting: Radiation Oncology

## 2022-12-22 DIAGNOSIS — Z510 Encounter for antineoplastic radiation therapy: Secondary | ICD-10-CM | POA: Diagnosis not present

## 2022-12-22 LAB — RAD ONC ARIA SESSION SUMMARY
Course Elapsed Days: 27
Plan Fractions Treated to Date: 19
Plan Prescribed Dose Per Fraction: 1.8 Gy
Plan Total Fractions Prescribed: 25
Plan Total Prescribed Dose: 45 Gy
Reference Point Dosage Given to Date: 34.2 Gy
Reference Point Session Dosage Given: 1.8 Gy
Session Number: 19

## 2022-12-23 ENCOUNTER — Other Ambulatory Visit: Payer: Self-pay

## 2022-12-23 ENCOUNTER — Ambulatory Visit
Admission: RE | Admit: 2022-12-23 | Discharge: 2022-12-23 | Disposition: A | Discharge status: Home / Self Care | Source: Ambulatory Visit | Attending: Radiation Oncology | Admitting: Radiation Oncology

## 2022-12-23 DIAGNOSIS — Z510 Encounter for antineoplastic radiation therapy: Secondary | ICD-10-CM | POA: Diagnosis not present

## 2022-12-23 LAB — RAD ONC ARIA SESSION SUMMARY
Course Elapsed Days: 28
Plan Fractions Treated to Date: 20
Plan Prescribed Dose Per Fraction: 1.8 Gy
Plan Total Fractions Prescribed: 25
Plan Total Prescribed Dose: 45 Gy
Reference Point Dosage Given to Date: 36 Gy
Reference Point Session Dosage Given: 1.8 Gy
Session Number: 20

## 2022-12-24 ENCOUNTER — Ambulatory Visit
Admission: RE | Admit: 2022-12-24 | Discharge: 2022-12-24 | Disposition: A | Discharge status: Home / Self Care | Source: Ambulatory Visit | Attending: Radiation Oncology | Admitting: Radiation Oncology

## 2022-12-24 ENCOUNTER — Other Ambulatory Visit: Payer: Self-pay

## 2022-12-24 DIAGNOSIS — Z510 Encounter for antineoplastic radiation therapy: Secondary | ICD-10-CM | POA: Diagnosis not present

## 2022-12-24 LAB — RAD ONC ARIA SESSION SUMMARY
Course Elapsed Days: 29
Plan Fractions Treated to Date: 21
Plan Prescribed Dose Per Fraction: 1.8 Gy
Plan Total Fractions Prescribed: 25
Plan Total Prescribed Dose: 45 Gy
Reference Point Dosage Given to Date: 37.8 Gy
Reference Point Session Dosage Given: 1.8 Gy
Session Number: 21

## 2022-12-28 ENCOUNTER — Ambulatory Visit
Admission: RE | Admit: 2022-12-28 | Discharge: 2022-12-28 | Disposition: A | Discharge status: Home / Self Care | Source: Ambulatory Visit | Attending: Radiation Oncology | Admitting: Radiation Oncology

## 2022-12-28 ENCOUNTER — Other Ambulatory Visit: Payer: Self-pay

## 2022-12-28 DIAGNOSIS — Z510 Encounter for antineoplastic radiation therapy: Secondary | ICD-10-CM | POA: Diagnosis not present

## 2022-12-28 LAB — RAD ONC ARIA SESSION SUMMARY
Course Elapsed Days: 33
Plan Fractions Treated to Date: 22
Plan Prescribed Dose Per Fraction: 1.8 Gy
Plan Total Fractions Prescribed: 25
Plan Total Prescribed Dose: 45 Gy
Reference Point Dosage Given to Date: 39.6 Gy
Reference Point Session Dosage Given: 1.8 Gy
Session Number: 22

## 2022-12-29 ENCOUNTER — Other Ambulatory Visit: Payer: Self-pay

## 2022-12-29 ENCOUNTER — Ambulatory Visit
Admission: RE | Admit: 2022-12-29 | Discharge: 2022-12-29 | Disposition: A | Discharge status: Home / Self Care | Source: Ambulatory Visit | Attending: Radiation Oncology | Admitting: Radiation Oncology

## 2022-12-29 DIAGNOSIS — Z510 Encounter for antineoplastic radiation therapy: Secondary | ICD-10-CM | POA: Diagnosis not present

## 2022-12-29 LAB — RAD ONC ARIA SESSION SUMMARY
Course Elapsed Days: 34
Plan Fractions Treated to Date: 23
Plan Prescribed Dose Per Fraction: 1.8 Gy
Plan Total Fractions Prescribed: 25
Plan Total Prescribed Dose: 45 Gy
Reference Point Dosage Given to Date: 41.4 Gy
Reference Point Session Dosage Given: 1.8 Gy
Session Number: 23

## 2022-12-30 ENCOUNTER — Other Ambulatory Visit: Payer: Self-pay

## 2022-12-30 ENCOUNTER — Ambulatory Visit
Admission: RE | Admit: 2022-12-30 | Discharge: 2022-12-30 | Disposition: A | Discharge status: Home / Self Care | Payer: TRICARE For Life (TFL) | Source: Ambulatory Visit | Attending: Radiation Oncology | Admitting: Radiation Oncology

## 2022-12-30 DIAGNOSIS — Z510 Encounter for antineoplastic radiation therapy: Secondary | ICD-10-CM | POA: Diagnosis not present

## 2022-12-30 LAB — RAD ONC ARIA SESSION SUMMARY
Course Elapsed Days: 35
Plan Fractions Treated to Date: 24
Plan Prescribed Dose Per Fraction: 1.8 Gy
Plan Total Fractions Prescribed: 25
Plan Total Prescribed Dose: 45 Gy
Reference Point Dosage Given to Date: 43.2 Gy
Reference Point Session Dosage Given: 1.8 Gy
Session Number: 24

## 2022-12-31 ENCOUNTER — Ambulatory Visit
Admission: RE | Admit: 2022-12-31 | Discharge: 2022-12-31 | Disposition: A | Discharge status: Home / Self Care | Source: Ambulatory Visit | Attending: Radiation Oncology | Admitting: Radiation Oncology

## 2022-12-31 ENCOUNTER — Ambulatory Visit

## 2022-12-31 ENCOUNTER — Other Ambulatory Visit: Payer: Self-pay

## 2022-12-31 DIAGNOSIS — Z510 Encounter for antineoplastic radiation therapy: Secondary | ICD-10-CM | POA: Diagnosis not present

## 2022-12-31 LAB — RAD ONC ARIA SESSION SUMMARY
Course Elapsed Days: 36
Plan Fractions Treated to Date: 25
Plan Prescribed Dose Per Fraction: 1.8 Gy
Plan Total Fractions Prescribed: 25
Plan Total Prescribed Dose: 45 Gy
Reference Point Dosage Given to Date: 45 Gy
Reference Point Session Dosage Given: 1.8 Gy
Session Number: 25

## 2023-01-03 ENCOUNTER — Other Ambulatory Visit: Payer: Self-pay

## 2023-01-03 ENCOUNTER — Ambulatory Visit: Payer: Medicare Other

## 2023-01-03 DIAGNOSIS — C61 Malignant neoplasm of prostate: Secondary | ICD-10-CM | POA: Insufficient documentation

## 2023-01-03 DIAGNOSIS — Z510 Encounter for antineoplastic radiation therapy: Secondary | ICD-10-CM | POA: Insufficient documentation

## 2023-01-03 LAB — RAD ONC ARIA SESSION SUMMARY
Course Elapsed Days: 39
Plan Fractions Treated to Date: 1
Plan Prescribed Dose Per Fraction: 1.8 Gy
Plan Total Fractions Prescribed: 13
Plan Total Prescribed Dose: 23.4 Gy
Reference Point Dosage Given to Date: 1.8 Gy
Reference Point Session Dosage Given: 1.8 Gy
Session Number: 26

## 2023-01-04 ENCOUNTER — Ambulatory Visit
Admission: RE | Admit: 2023-01-04 | Discharge: 2023-01-04 | Disposition: A | Discharge status: Home / Self Care | Payer: Medicare Other | Source: Ambulatory Visit | Attending: Radiation Oncology | Admitting: Radiation Oncology

## 2023-01-04 ENCOUNTER — Other Ambulatory Visit: Payer: Self-pay

## 2023-01-04 DIAGNOSIS — Z510 Encounter for antineoplastic radiation therapy: Secondary | ICD-10-CM | POA: Diagnosis not present

## 2023-01-04 LAB — RAD ONC ARIA SESSION SUMMARY
Course Elapsed Days: 40
Plan Fractions Treated to Date: 2
Plan Prescribed Dose Per Fraction: 1.8 Gy
Plan Total Fractions Prescribed: 13
Plan Total Prescribed Dose: 23.4 Gy
Reference Point Dosage Given to Date: 3.6 Gy
Reference Point Session Dosage Given: 1.8 Gy
Session Number: 27

## 2023-01-05 ENCOUNTER — Other Ambulatory Visit: Payer: Self-pay

## 2023-01-05 ENCOUNTER — Ambulatory Visit
Admission: RE | Admit: 2023-01-05 | Discharge: 2023-01-05 | Disposition: A | Discharge status: Home / Self Care | Payer: Medicare Other | Source: Ambulatory Visit | Attending: Radiation Oncology | Admitting: Radiation Oncology

## 2023-01-05 DIAGNOSIS — Z510 Encounter for antineoplastic radiation therapy: Secondary | ICD-10-CM | POA: Diagnosis not present

## 2023-01-05 LAB — RAD ONC ARIA SESSION SUMMARY
Course Elapsed Days: 41
Plan Fractions Treated to Date: 3
Plan Prescribed Dose Per Fraction: 1.8 Gy
Plan Total Fractions Prescribed: 13
Plan Total Prescribed Dose: 23.4 Gy
Reference Point Dosage Given to Date: 5.4 Gy
Reference Point Session Dosage Given: 1.8 Gy
Session Number: 28

## 2023-01-06 ENCOUNTER — Other Ambulatory Visit: Payer: Self-pay

## 2023-01-06 ENCOUNTER — Ambulatory Visit
Admission: RE | Admit: 2023-01-06 | Discharge: 2023-01-06 | Disposition: A | Discharge status: Home / Self Care | Payer: Medicare Other | Source: Ambulatory Visit | Attending: Radiation Oncology | Admitting: Radiation Oncology

## 2023-01-06 DIAGNOSIS — Z510 Encounter for antineoplastic radiation therapy: Secondary | ICD-10-CM | POA: Diagnosis not present

## 2023-01-06 LAB — RAD ONC ARIA SESSION SUMMARY
Course Elapsed Days: 42
Plan Fractions Treated to Date: 4
Plan Prescribed Dose Per Fraction: 1.8 Gy
Plan Total Fractions Prescribed: 13
Plan Total Prescribed Dose: 23.4 Gy
Reference Point Dosage Given to Date: 7.2 Gy
Reference Point Session Dosage Given: 1.8 Gy
Session Number: 29

## 2023-01-07 ENCOUNTER — Ambulatory Visit
Admission: RE | Admit: 2023-01-07 | Discharge: 2023-01-07 | Disposition: A | Discharge status: Home / Self Care | Payer: Medicare Other | Source: Ambulatory Visit | Attending: Radiation Oncology | Admitting: Radiation Oncology

## 2023-01-07 ENCOUNTER — Other Ambulatory Visit: Payer: Self-pay

## 2023-01-07 DIAGNOSIS — Z510 Encounter for antineoplastic radiation therapy: Secondary | ICD-10-CM | POA: Diagnosis not present

## 2023-01-07 LAB — RAD ONC ARIA SESSION SUMMARY
Course Elapsed Days: 43
Plan Fractions Treated to Date: 5
Plan Prescribed Dose Per Fraction: 1.8 Gy
Plan Total Fractions Prescribed: 13
Plan Total Prescribed Dose: 23.4 Gy
Reference Point Dosage Given to Date: 9 Gy
Reference Point Session Dosage Given: 1.8 Gy
Session Number: 30

## 2023-01-10 ENCOUNTER — Other Ambulatory Visit: Payer: Self-pay

## 2023-01-10 ENCOUNTER — Ambulatory Visit
Admission: RE | Admit: 2023-01-10 | Discharge: 2023-01-10 | Disposition: A | Discharge status: Home / Self Care | Payer: Medicare Other | Source: Ambulatory Visit | Attending: Radiation Oncology | Admitting: Radiation Oncology

## 2023-01-10 DIAGNOSIS — Z510 Encounter for antineoplastic radiation therapy: Secondary | ICD-10-CM | POA: Diagnosis not present

## 2023-01-10 LAB — RAD ONC ARIA SESSION SUMMARY
Course Elapsed Days: 46
Plan Fractions Treated to Date: 6
Plan Prescribed Dose Per Fraction: 1.8 Gy
Plan Total Fractions Prescribed: 13
Plan Total Prescribed Dose: 23.4 Gy
Reference Point Dosage Given to Date: 10.8 Gy
Reference Point Session Dosage Given: 1.8 Gy
Session Number: 31

## 2023-01-11 ENCOUNTER — Other Ambulatory Visit: Payer: Self-pay

## 2023-01-11 ENCOUNTER — Ambulatory Visit
Admission: RE | Admit: 2023-01-11 | Discharge: 2023-01-11 | Disposition: A | Discharge status: Home / Self Care | Payer: Medicare Other | Source: Ambulatory Visit | Attending: Radiation Oncology

## 2023-01-11 DIAGNOSIS — Z510 Encounter for antineoplastic radiation therapy: Secondary | ICD-10-CM | POA: Diagnosis not present

## 2023-01-11 LAB — RAD ONC ARIA SESSION SUMMARY
Course Elapsed Days: 47
Plan Fractions Treated to Date: 7
Plan Prescribed Dose Per Fraction: 1.8 Gy
Plan Total Fractions Prescribed: 13
Plan Total Prescribed Dose: 23.4 Gy
Reference Point Dosage Given to Date: 12.6 Gy
Reference Point Session Dosage Given: 1.8 Gy
Session Number: 32

## 2023-01-12 ENCOUNTER — Other Ambulatory Visit: Payer: Self-pay

## 2023-01-12 ENCOUNTER — Ambulatory Visit
Admission: RE | Admit: 2023-01-12 | Discharge: 2023-01-12 | Disposition: A | Discharge status: Home / Self Care | Payer: Medicare Other | Source: Ambulatory Visit | Attending: Radiation Oncology | Admitting: Radiation Oncology

## 2023-01-12 DIAGNOSIS — Z510 Encounter for antineoplastic radiation therapy: Secondary | ICD-10-CM | POA: Diagnosis not present

## 2023-01-12 LAB — RAD ONC ARIA SESSION SUMMARY
Course Elapsed Days: 48
Plan Fractions Treated to Date: 8
Plan Prescribed Dose Per Fraction: 1.8 Gy
Plan Total Fractions Prescribed: 13
Plan Total Prescribed Dose: 23.4 Gy
Reference Point Dosage Given to Date: 14.4 Gy
Reference Point Session Dosage Given: 1.8 Gy
Session Number: 33

## 2023-01-13 ENCOUNTER — Other Ambulatory Visit: Payer: Self-pay

## 2023-01-13 ENCOUNTER — Ambulatory Visit
Admission: RE | Admit: 2023-01-13 | Discharge: 2023-01-13 | Disposition: A | Discharge status: Home / Self Care | Payer: Medicare Other | Source: Ambulatory Visit | Attending: Radiation Oncology | Admitting: Radiation Oncology

## 2023-01-13 ENCOUNTER — Ambulatory Visit: Payer: Medicare Other

## 2023-01-13 DIAGNOSIS — Z510 Encounter for antineoplastic radiation therapy: Secondary | ICD-10-CM | POA: Diagnosis not present

## 2023-01-13 LAB — RAD ONC ARIA SESSION SUMMARY
Course Elapsed Days: 49
Plan Fractions Treated to Date: 9
Plan Prescribed Dose Per Fraction: 1.8 Gy
Plan Total Fractions Prescribed: 13
Plan Total Prescribed Dose: 23.4 Gy
Reference Point Dosage Given to Date: 16.2 Gy
Reference Point Session Dosage Given: 1.8 Gy
Session Number: 34

## 2023-01-14 ENCOUNTER — Other Ambulatory Visit: Payer: Self-pay

## 2023-01-14 ENCOUNTER — Ambulatory Visit
Admission: RE | Admit: 2023-01-14 | Discharge: 2023-01-14 | Disposition: A | Discharge status: Home / Self Care | Payer: Medicare Other | Source: Ambulatory Visit | Attending: Radiation Oncology | Admitting: Radiation Oncology

## 2023-01-14 DIAGNOSIS — Z510 Encounter for antineoplastic radiation therapy: Secondary | ICD-10-CM | POA: Diagnosis not present

## 2023-01-14 LAB — RAD ONC ARIA SESSION SUMMARY
Course Elapsed Days: 50
Plan Fractions Treated to Date: 10
Plan Prescribed Dose Per Fraction: 1.8 Gy
Plan Total Fractions Prescribed: 13
Plan Total Prescribed Dose: 23.4 Gy
Reference Point Dosage Given to Date: 18 Gy
Reference Point Session Dosage Given: 1.8 Gy
Session Number: 35

## 2023-01-17 ENCOUNTER — Ambulatory Visit
Admission: RE | Admit: 2023-01-17 | Discharge: 2023-01-17 | Disposition: A | Discharge status: Home / Self Care | Payer: Medicare Other | Source: Ambulatory Visit | Attending: Radiation Oncology

## 2023-01-17 ENCOUNTER — Other Ambulatory Visit: Payer: Self-pay

## 2023-01-17 DIAGNOSIS — Z510 Encounter for antineoplastic radiation therapy: Secondary | ICD-10-CM | POA: Diagnosis not present

## 2023-01-17 LAB — RAD ONC ARIA SESSION SUMMARY
Course Elapsed Days: 53
Plan Fractions Treated to Date: 11
Plan Prescribed Dose Per Fraction: 1.8 Gy
Plan Total Fractions Prescribed: 13
Plan Total Prescribed Dose: 23.4 Gy
Reference Point Dosage Given to Date: 19.8 Gy
Reference Point Session Dosage Given: 1.8 Gy
Session Number: 36

## 2023-01-18 ENCOUNTER — Ambulatory Visit
Admission: RE | Admit: 2023-01-18 | Discharge: 2023-01-18 | Disposition: A | Discharge status: Home / Self Care | Payer: Medicare Other | Source: Ambulatory Visit | Attending: Radiation Oncology | Admitting: Radiation Oncology

## 2023-01-18 ENCOUNTER — Other Ambulatory Visit: Payer: Self-pay

## 2023-01-18 ENCOUNTER — Ambulatory Visit: Payer: Medicare Other

## 2023-01-18 DIAGNOSIS — Z510 Encounter for antineoplastic radiation therapy: Secondary | ICD-10-CM | POA: Diagnosis not present

## 2023-01-18 LAB — RAD ONC ARIA SESSION SUMMARY
Course Elapsed Days: 54
Plan Fractions Treated to Date: 12
Plan Prescribed Dose Per Fraction: 1.8 Gy
Plan Total Fractions Prescribed: 13
Plan Total Prescribed Dose: 23.4 Gy
Reference Point Dosage Given to Date: 21.6 Gy
Reference Point Session Dosage Given: 1.8 Gy
Session Number: 37

## 2023-01-19 ENCOUNTER — Ambulatory Visit
Admission: RE | Admit: 2023-01-19 | Discharge: 2023-01-19 | Disposition: A | Discharge status: Home / Self Care | Payer: Medicare Other | Source: Ambulatory Visit | Attending: Radiation Oncology | Admitting: Radiation Oncology

## 2023-01-19 ENCOUNTER — Other Ambulatory Visit: Payer: Self-pay

## 2023-01-19 ENCOUNTER — Ambulatory Visit: Payer: Medicare Other

## 2023-01-19 DIAGNOSIS — Z510 Encounter for antineoplastic radiation therapy: Secondary | ICD-10-CM | POA: Diagnosis not present

## 2023-01-19 LAB — RAD ONC ARIA SESSION SUMMARY
Course Elapsed Days: 55
Plan Fractions Treated to Date: 13
Plan Prescribed Dose Per Fraction: 1.8 Gy
Plan Total Fractions Prescribed: 13
Plan Total Prescribed Dose: 23.4 Gy
Reference Point Dosage Given to Date: 23.4 Gy
Reference Point Session Dosage Given: 1.8 Gy
Session Number: 38

## 2023-01-20 NOTE — Progress Notes (Signed)
Patient was a RadOnc Consult on 11/08/22 for his rising PSA of 0.12 s/p RRP 12/2021 for pT3aN0, Gleason score 4+3.  Patient proceed with treatment recommendations of salvage radiation treatment for 7.5 weeks and had his final radiation treatment on 01/19/23.   Patient is scheduled for a post treatment nurse call on 02/22/23 and has his first post treatment PSA on 04/14/23 at [facility].

## 2023-01-20 NOTE — Radiation Completion Notes (Addendum)
  Radiation Oncology         [phone] ________________________________  Name: [name]. MRN: [id number]  Date: 01/19/2023  DOB: [birth year]  End of Treatment Note  Patient Name: Daniel Davis, Daniel Davis MRN: [id number] Date of Birth: 02-09-1956 Referring Physician: Sebastian Ache, M.D. Date of Service: 2023-01-20 Radiation Oncologist: Margaretmary Bayley, M.D. [facility]     RADIATION ONCOLOGY END OF TREATMENT NOTE     Diagnosis: 67 y.o. gentleman with a rising PSA of 0.12 s/p RRP 12/2021 for pT3aN0, Gleason score 4+3   Intent: Curative     ==========DELIVERED PLANS==========  First Treatment Date: 2022-11-25 - Last Treatment Date: 2023-01-19   Plan Name: ProstBed_Pelv Site: Prostate Bed Technique: IMRT Mode: Photon Dose Per Fraction: 1.8 Gy Prescribed Dose (Delivered / Prescribed): 45 Gy / 45 Gy Prescribed Fxs (Delivered / Prescribed): 25 / 25   Plan Name: ProstBed_Bst Site: Prostate Bed Technique: IMRT Mode: Photon Dose Per Fraction: 1.8 Gy Prescribed Dose (Delivered / Prescribed): 23.4 Gy / 23.4 Gy Prescribed Fxs (Delivered / Prescribed): 13 / 13     ==========ON TREATMENT VISIT DATES========== 2022-11-25, 2022-12-02, 2022-12-10, 2022-12-17, 2022-12-23, 2022-12-31, 2023-01-07, 2023-01-13, 2023-01-19   See weekly On Treatment Notes in Epic for details.  He tolerated his treatments well with only mild urinary symptoms and modest fatigue.  The patient will receive a call in about one month from the radiation oncology department. He will continue follow up with his urologist, Dr. Berneice Heinrich as well.  ------------------------------------------------   Margaretmary Dys, MD [facility]  Radiation Oncology Direct Dial: [phone]  Fax: [phone] [email or web address]  Skype  LinkedIn

## 2023-02-21 NOTE — Progress Notes (Signed)
  Radiation Oncology         [phone] ________________________________  Name: Daniel Davis. MRN: [id number]  Date of Service: 02/22/2023  DOB: 02/03/1956  Post Treatment Telephone Note  Diagnosis:   67 y.o. gentleman with a rising PSA of 0.12 s/p RRP 12/2021 for pT3aN0, Gleason score 4+3 (as documented in provider EOT note)   Pre Treatment IPSS Score: 4 (as documented in the provider consult note)  The patient was not available for call today. Voicemail left.  Patient has a scheduled follow up visit with his urologist, Dr. Berneice Heinrich, on 03/2023  for ongoing surveillance. He was counseled that PSA levels will be drawn in the urology office, and was reassured that additional time is expected to improve bowel and bladder symptoms. He was encouraged to call back with concerns or questions regarding radiation.    [name], LPN

## 2023-02-22 ENCOUNTER — Ambulatory Visit
Admission: RE | Admit: 2023-02-22 | Discharge: 2023-02-22 | Disposition: A | Discharge status: Home / Self Care | Payer: Medicare Other | Source: Ambulatory Visit | Attending: Radiation Oncology | Admitting: Radiation Oncology

## 2023-02-22 ENCOUNTER — Telehealth: Payer: Self-pay | Admitting: Radiation Oncology

## 2023-02-22 DIAGNOSIS — C61 Malignant neoplasm of prostate: Secondary | ICD-10-CM | POA: Insufficient documentation

## 2023-02-22 DIAGNOSIS — Z510 Encounter for antineoplastic radiation therapy: Secondary | ICD-10-CM | POA: Insufficient documentation

## 2023-02-22 NOTE — Telephone Encounter (Signed)
Returned call from patient inquiring about post-treatment call. When i spoke to patient, he stated he never received a call. I advised the pt of the VM left by [name] per note 02/22/23 @3 :38pm. I advised patient I would contact Marcelino Duster to r/s this call. Patient refused r/s at this time. Staff notified via inbasket.

## 2023-02-23 ENCOUNTER — Telehealth: Payer: Self-pay

## 2023-02-23 NOTE — Telephone Encounter (Signed)
Returned patient call. Called all numbers on file w/ no success. I left a voicemail for him to feel free to return my call if he still needs to speak w/ me. [phone] or [phone].   [name], LPN

## 2023-02-24 ENCOUNTER — Telehealth: Payer: Self-pay | Admitting: Urology

## 2023-02-24 NOTE — Progress Notes (Signed)
RN spoke with patient and reviewed post treatment side effects and educated on PSA monitoring.  Pt reports fatigue, however, is improving and plans to resume outdoor activities.  Pt aware of Urology appointments.  No additional needs at this time.

## 2023-02-24 NOTE — Telephone Encounter (Signed)
7/25 @ 8:13 am patient called to speak to someone about his missed post treatment call appt on 7/23.  He stated would like a called back on [phone].  Secure chat sent to [name] so they are aware.

## 2023-03-02 ENCOUNTER — Encounter (INDEPENDENT_AMBULATORY_CARE_PROVIDER_SITE_OTHER): Payer: Self-pay

## 2023-03-21 ENCOUNTER — Other Ambulatory Visit (INDEPENDENT_AMBULATORY_CARE_PROVIDER_SITE_OTHER): Payer: Medicare Other

## 2023-03-21 ENCOUNTER — Encounter: Payer: Self-pay | Admitting: Family Medicine

## 2023-03-21 ENCOUNTER — Ambulatory Visit (INDEPENDENT_AMBULATORY_CARE_PROVIDER_SITE_OTHER): Payer: Medicare Other | Admitting: Family Medicine

## 2023-03-21 VITALS — BP 128/72 | HR 83 | Temp 97.6°F | Ht 70.0 in | Wt 193.4 lb

## 2023-03-21 DIAGNOSIS — E785 Hyperlipidemia, unspecified: Secondary | ICD-10-CM

## 2023-03-21 DIAGNOSIS — M109 Gout, unspecified: Secondary | ICD-10-CM | POA: Diagnosis not present

## 2023-03-21 DIAGNOSIS — R7303 Prediabetes: Secondary | ICD-10-CM | POA: Diagnosis not present

## 2023-03-21 DIAGNOSIS — Z0000 Encounter for general adult medical examination without abnormal findings: Secondary | ICD-10-CM

## 2023-03-21 DIAGNOSIS — I10 Essential (primary) hypertension: Secondary | ICD-10-CM

## 2023-03-21 LAB — LDL CHOLESTEROL, DIRECT: Direct LDL: 35 mg/dL

## 2023-03-21 LAB — COMPREHENSIVE METABOLIC PANEL
ALT: 17 U/L (ref 0–53)
AST: 45 U/L — ABNORMAL HIGH (ref 0–37)
Albumin: 4.2 g/dL (ref 3.5–5.2)
Alkaline Phosphatase: 66 U/L (ref 39–117)
BUN: 10 mg/dL (ref 6–23)
CO2: 23 mEq/L (ref 19–32)
Calcium: 9.2 mg/dL (ref 8.4–10.5)
Chloride: 101 mEq/L (ref 96–112)
Creatinine, Ser: 0.69 mg/dL (ref 0.40–1.50)
GFR: 97.25 mL/min (ref >60.00)
Glucose, Bld: 94 mg/dL (ref 70–99)
Potassium: 3.5 mEq/L (ref 3.5–5.1)
Sodium: 135 mEq/L (ref 135–145)
Total Bilirubin: 0.4 mg/dL (ref 0.2–1.2)
Total Protein: 6.9 g/dL (ref 6.0–8.3)

## 2023-03-21 LAB — LIPID PANEL
Cholesterol: 136 mg/dL (ref 0–200)
HDL: 67.9 mg/dL (ref >39.00)
NonHDL: 68.31
Total CHOL/HDL Ratio: 2
Triglycerides: 302 mg/dL — ABNORMAL HIGH (ref 0.0–149.0)
VLDL: 60.4 mg/dL — ABNORMAL HIGH (ref 0.0–40.0)

## 2023-03-21 LAB — HEMOGLOBIN A1C: Hgb A1c MFr Bld: 5.9 % (ref 4.6–6.5)

## 2023-03-21 MED ORDER — AMLODIPINE BESYLATE 5 MG PO TABS: 5.0000 mg | ORAL_TABLET | Freq: Every day | ORAL | 2 refills | Status: DC

## 2023-03-21 MED ORDER — ATORVASTATIN CALCIUM 20 MG PO TABS: ORAL_TABLET | ORAL | 2 refills | Status: DC

## 2023-03-21 MED ORDER — LISINOPRIL 20 MG PO TABS: 20.0000 mg | ORAL_TABLET | Freq: Every day | ORAL | 2 refills | Status: DC

## 2023-03-21 MED ORDER — INDOMETHACIN 50 MG PO CAPS: 50.0000 mg | ORAL_CAPSULE | Freq: Three times a day (TID) | ORAL | 1 refills | Status: DC | PRN

## 2023-03-21 MED ORDER — ALLOPURINOL 100 MG PO TABS: 100.0000 mg | ORAL_TABLET | Freq: Every day | ORAL | 2 refills | Status: DC

## 2023-03-21 NOTE — Progress Notes (Signed)
Subjective:  Patient ID: Daniel Neth., male    DOB: 1956/04/17  Age: 67 y.o. MRN: [id number]  CC:  Chief Complaint  Patient presents with   Annual Exam    HPI Daniel Davis. presents for Annual Exam PCP, me.  He is also seeing primary care through the [place]. Recent visit. No changes.  Urology, Dr. Berneice Heinrich.  High risk prostate cancer status post robotic prostatectomy with ICG sentinel template node dissection in June 2023, pT3a N0 MX grade 3 cancer.  Preop PSA 6.4.  Detectable PSA postsurgery, adjuvant radiation therapy.  Has completed radiation.  Trimix as option for erectile dysfunction- not using. Will discuss at follow up -   Urology appointment soon for repeat PSA.  Hypertension: Amlodipine 5 mg daily, lisinopril 20 mg daily.no med side effects.  Home readings: none BP Readings from Last 3 Encounters:  03/21/23 128/72  11/08/22 118/78  09/16/22 130/76   Lab Results  Component Value Date   CREATININE 0.78 01/09/2022    Prediabetes: Diet/exercise approach. Weight stable.  Avoiding sugar beverages.  Walking daily. .  Lab Results  Component Value Date   HGBA1C 5.9 (H) 12/30/2021   Wt Readings from Last 3 Encounters:  03/21/23 193 lb 6.4 oz (87.7 kg)  11/08/22 194 lb 9.6 oz (88.3 kg)  09/16/22 195 lb 12.8 oz (88.8 kg)   Hyperlipidemia: Lipitor 20 mg daily, no side effects Lab Results  Component Value Date   CHOL 130 03/26/2022   HDL 70.90 03/26/2022   LDLCALC 54 09/12/2020   LDLDIRECT 40.0 03/26/2022   TRIG (H) 03/26/2022    478.0 Triglyceride is over 400; calculations on Lipids are invalid.   CHOLHDL 2 03/26/2022   Lab Results  Component Value Date   ALT 16 09/14/2021   AST 49 (H) 09/14/2021   ALKPHOS 65 09/14/2021   BILITOT 0.4 09/14/2021   Gout: Last flare: none Daily meds: Allopurinol 100 mg daily Prn med: Indomethacin if needed Lab Results  Component Value Date   LABURIC 5.6 03/26/2020       03/21/2023    8:13 AM 11/08/2022    1:55 PM  09/16/2022    8:17 AM 03/26/2022    8:23 AM 09/14/2021    8:07 AM  Depression screen PHQ 2/9  Decreased Interest 0 0 0 0 1  Down, Depressed, Hopeless 0 0 0 0 0  PHQ - 2 Score 0 0 0 0 1  Altered sleeping 0   0   Tired, decreased energy 0   0   Change in appetite 0   0   Feeling bad or failure about yourself  0   0   Trouble concentrating 0   0   Moving slowly or fidgety/restless 0   0   Suicidal thoughts 0   0   PHQ-9 Score 0   0     Health Maintenance  Topic Date Due   Medicare Annual Wellness (AWV)  Never done   COVID-19 Vaccine (7 - 2023-24 season) 07/05/2022   INFLUENZA VACCINE  03/03/2023   DTaP/Tdap/Td (3 - Td or Tdap) 12/21/2028   Colonoscopy  09/09/2029   Pneumonia Vaccine 61+ Years old  Completed   Hepatitis C Screening  Completed   HIV Screening  Completed   Zoster Vaccines- Shingrix  Completed   HPV VACCINES  Aged Out  Colonoscopy 09/09/2022, repeat 41yrs.  Prostate: Cancer treatment as above.   Lab Results  Component Value Date   PSA1 5.1 (H) 09/10/2019  PSA1 3.6 06/26/2018   PSA1 5.4 (H) 12/20/2017   PSA 2.60 12/24/2015   PSA 2.37 12/16/2014   PSA 1.96 11/26/2013   Immunization History  Administered Date(s) Administered   Influenza Nasal 05/16/2014, 05/18/2016, 05/05/2017   Influenza,inj,Quad PF,6+ Mos 06/16/2015, 05/03/2019, 03/26/2022   Influenza-Unspecified 05/05/2020, 05/20/2021   Moderna Covid-19 Vaccine Bivalent Booster 54yrs & up 09/02/2021   Moderna Sars-Covid-2 Vaccination 10/18/2019, 11/20/2019, 06/19/2020, 12/23/2020   PNEUMOCOCCAL CONJUGATE-20 03/23/2022   Respiratory Syncytial Virus Vaccine,Recomb Aduvanted(Arexvy) 03/15/2023   Tdap 12/27/2007, 12/22/2018   Zoster Recombinant(Shingrix) 12/20/2017, 08/21/2018, 04/16/2019, 12/21/2019  Covid booster and flu vaccine recommended when available.   No results found. No recent optho - reading glasses.   Dental: within 6 months.   Alcohol: 3-5 per week.   Tobacco: none  Exercise:walking  5d/week, occasional weights.    History Patient Active Problem List   Diagnosis Date Noted   Hyperlipidemia 09/16/2022   Prediabetes 09/16/2022   Prostate cancer (HCC) 01/08/2022   Nonspecific abnormal finding in stool contents 02/28/2015   Family history of cancer 02/28/2015   OA (osteoarthritis) 02/28/2012   Essential hypertension 11/27/2011   Lipid disorder 11/27/2011   Gout of multiple sites 11/27/2011   Past Medical History:  Diagnosis Date   Arthritis    Cancer (HCC) 12/2021   prostate   Colon polyps    Gout attack    Hyperlipidemia    Hypertension    Past Surgical History:  Procedure Laterality Date   COLONOSCOPY  03/2015   FRACTURE SURGERY  08/02/1998   LEFT ankle, ORIF   LYMPH NODE DISSECTION Bilateral 01/08/2022   Procedure: LYMPH NODE DISSECTION;  Surgeon: Sebastian Ache, MD;  Location: [facility];  Service: Urology;  Laterality: Bilateral;   ROBOT ASSISTED LAPAROSCOPIC RADICAL PROSTATECTOMY N/A 01/08/2022   Procedure: XI ROBOTIC ASSISTED LAPAROSCOPIC RADICAL PROSTATECTOMY AND INDOCYANINE GREEN DYE;  Surgeon: Sebastian Ache, MD;  Location: [facility];  Service: Urology;  Laterality: N/A;   WISDOM TOOTH EXTRACTION     Allergies  Allergen Reactions   Gadolinium Derivatives Hives    11/18/17 developed 4-5 hives on neck and face; no other complaints.  Treated with Benadryl 25mg  PO (pt given 2 more caps to take with him for 3.5 mile drive home).  Needs Benadryl 50mg  PO pre-med prior to next MRI w/ gad, per Dr. Paulina Fusi.   Methocarbamol Hives   Prior to Admission medications   Medication Sig Start Date End Date Taking? Authorizing Provider  allopurinol (ZYLOPRIM) 100 MG tablet Take 1 tablet (100 mg total) by mouth daily. 09/16/22  Yes Shade Flood, MD  amLODipine (NORVASC) 5 MG tablet Take 1 tablet (5 mg total) by mouth daily. 09/16/22  Yes Shade Flood, MD  aspirin EC 81 MG tablet Take 81 mg by mouth daily. Swallow whole.   Yes [provider]   atorvastatin (LIPITOR) 20 MG tablet take 1 tablet by mouth at bedtime 09/16/22  Yes Shade Flood, MD  docusate sodium (COLACE) 100 MG capsule Take 1 capsule (100 mg total) by mouth 2 (two) times daily. 01/08/22  Yes Dancy, Marchelle Folks, PA-C  ferrous sulfate 325 (65 FE) MG tablet Take 325 mg by mouth daily with breakfast.   Yes [provider]  indomethacin (INDOCIN) 50 MG capsule Take 1 capsule (50 mg total) by mouth 3 (three) times daily as needed. for MILD PAIN(GOUT) 03/26/22  Yes Shade Flood, MD  lisinopril (ZESTRIL) 20 MG tablet Take 1 tablet (20 mg total) by mouth daily.  09/16/22  Yes Shade Flood, MD   Social History   Socioeconomic History   Marital status: Divorced    Spouse name: Nelma Rothman   Number of children: 1   Years of education: 14+   Highest education level: Not on file  Occupational History   Occupation: SHIFT SUPERVISOR    Employer: [employer]  Tobacco Use   Smoking status: Never   Smokeless tobacco: Never  Vaping Use   Vaping status: Never Used  Substance and Sexual Activity   Alcohol use: Yes    Alcohol/week: 3.0 standard drinks of alcohol    Types: 3 Standard drinks or equivalent per week    Comment: 3-5 drinks   Drug use: No   Sexual activity: Yes    Partners: Female  Other Topics Concern   Not on file  Social History Narrative   Lives with his wife and their daughter.   Exercise: Walking   Education: some Diplomatic Services operational officer Determinants of Health   Financial Resource Strain: Not on file  Food Insecurity: No Food Insecurity (11/08/2022)   Hunger Vital Sign    Worried About Running Out of Food in the Last Year: Never true    Ran Out of Food in the Last Year: Never true  Transportation Needs: No Transportation Needs (11/08/2022)   PRAPARE - Administrator, Civil Service (Medical): No    Lack of Transportation (Non-Medical): No  Physical Activity: Not on file  Stress: Not on file  Social Connections: Not on file  Intimate  Partner Violence: Not At Risk (11/08/2022)   Humiliation, Afraid, Rape, and Kick questionnaire    Fear of Current or Ex-Partner: No    Emotionally Abused: No    Physically Abused: No    Sexually Abused: No    Review of Systems 13 point review of systems per patient health survey noted.  Negative other than as indicated above or in HPI.    Objective:   Vitals:   03/21/23 0811  BP: 128/72  Pulse: 83  Temp: 97.6 F (36.4 C)  TempSrc: Temporal  SpO2: 99%  Weight: 193 lb 6.4 oz (87.7 kg)  Height: 5\' 10"  (1.778 m)     Physical Exam Vitals reviewed.  Constitutional:      Appearance: He is well-developed.  HENT:     Head: Normocephalic and atraumatic.     Right Ear: External ear normal.     Left Ear: External ear normal.  Eyes:     Conjunctiva/sclera: Conjunctivae normal.     Pupils: Pupils are equal, round, and reactive to light.  Neck:     Thyroid: No thyromegaly.  Cardiovascular:     Rate and Rhythm: Normal rate and regular rhythm.     Heart sounds: Normal heart sounds.  Pulmonary:     Effort: Pulmonary effort is normal. No respiratory distress.     Breath sounds: Normal breath sounds. No wheezing.  Abdominal:     General: There is no distension.     Palpations: Abdomen is soft.     Tenderness: There is no abdominal tenderness.  Musculoskeletal:        General: No tenderness. Normal range of motion.     Cervical back: Normal range of motion and neck supple.  Lymphadenopathy:     Cervical: No cervical adenopathy.  Skin:    General: Skin is warm and dry.  Neurological:     Mental Status: He is alert and oriented to person, place, and time.  Deep Tendon Reflexes: Reflexes are normal and symmetric.  Psychiatric:        Behavior: Behavior normal.        Assessment & Plan:  Daniel Savely. is a 67 y.o. male . Annual physical exam  - -anticipatory guidance as below in AVS, screening labs above. Health maintenance items as above in HPI  discussed/recommended as applicable.   -Recovering from radiation, under care of urology for prostate cancer treatment with upcoming appointment.  Essential hypertension - Plan: amLODipine (NORVASC) 5 MG tablet, lisinopril (ZESTRIL) 20 MG tablet  - Stable, tolerating current regimen. Medications refilled. Labs pending as above.   Gout of multiple sites, unspecified cause, unspecified chronicity - Plan: allopurinol (ZYLOPRIM) 100 MG tablet, indomethacin (INDOCIN) 50 MG capsule  -  Stable, tolerating current regimen. Medications refilled.   Hyperlipidemia, unspecified hyperlipidemia type - Plan: atorvastatin (LIPITOR) 20 MG tablet, Comprehensive metabolic panel, Lipid panel  -  Stable, tolerating current regimen. Medications refilled. Labs pending as above.   Prediabetes - Plan: Hemoglobin A1c  -  Stable, commended on diet/exercise.  Check A1c, Labs pending as above.    Meds ordered this encounter  Medications   amLODipine (NORVASC) 5 MG tablet    Sig: Take 1 tablet (5 mg total) by mouth daily.    Dispense:  90 tablet    Refill:  2   lisinopril (ZESTRIL) 20 MG tablet    Sig: Take 1 tablet (20 mg total) by mouth daily.    Dispense:  90 tablet    Refill:  2   allopurinol (ZYLOPRIM) 100 MG tablet    Sig: Take 1 tablet (100 mg total) by mouth daily.    Dispense:  90 tablet    Refill:  2   indomethacin (INDOCIN) 50 MG capsule    Sig: Take 1 capsule (50 mg total) by mouth 3 (three) times daily as needed. for MILD PAIN(GOUT)    Dispense:  60 capsule    Refill:  1   atorvastatin (LIPITOR) 20 MG tablet    Sig: take 1 tablet by mouth at bedtime    Dispense:  90 tablet    Refill:  2   Patient Instructions  Flu and covid vaccines when available.  I recommend eye specialist appointment at some point.  Keep up the good work with exercise and diet.  You are doing great.  Recheck with me in 6 months, but happy to see sooner if needed.  Take care.  Preventive Care 58 Years and Older,  Male Preventive care refers to lifestyle choices and visits with your health care provider that can promote health and wellness. Preventive care visits are also called wellness exams. What can I expect for my preventive care visit? Counseling During your preventive care visit, your health care provider may ask about your: Medical history, including: Past medical problems. Family medical history. History of falls. Current health, including: Emotional well-being. Home life and relationship well-being. Sexual activity. Memory and ability to understand (cognition). Lifestyle, including: Alcohol, nicotine or tobacco, and drug use. Access to firearms. Diet, exercise, and sleep habits. Work and work Astronomer. Sunscreen use. Safety issues such as seatbelt and bike helmet use. Physical exam Your health care provider will check your: Height and weight. These may be used to calculate your BMI (body mass index). BMI is a measurement that tells if you are at a healthy weight. Waist circumference. This measures the distance around your waistline. This measurement also tells if you are at a healthy  weight and may help predict your risk of certain diseases, such as type 2 diabetes and high blood pressure. Heart rate and blood pressure. Body temperature. Skin for abnormal spots. What immunizations do I need?  Vaccines are usually given at various ages, according to a schedule. Your health care provider will recommend vaccines for you based on your age, medical history, and lifestyle or other factors, such as travel or where you work. What tests do I need? Screening Your health care provider may recommend screening tests for certain conditions. This may include: Lipid and cholesterol levels. Diabetes screening. This is done by checking your blood sugar (glucose) after you have not eaten for a while (fasting). Hepatitis C test. Hepatitis B test. HIV (human immunodeficiency virus) test. STI  (sexually transmitted infection) testing, if you are at risk. Lung cancer screening. Colorectal cancer screening. Prostate cancer screening. Abdominal aortic aneurysm (AAA) screening. You may need this if you are a current or former smoker. Talk with your health care provider about your test results, treatment options, and if necessary, the need for more tests. Follow these instructions at home: Eating and drinking  Eat a diet that includes fresh fruits and vegetables, whole grains, lean protein, and low-fat dairy products. Limit your intake of foods with high amounts of sugar, saturated fats, and salt. Take vitamin and mineral supplements as recommended by your health care provider. Do not drink alcohol if your health care provider tells you not to drink. If you drink alcohol: Limit how much you have to 0-2 drinks a day. Know how much alcohol is in your drink. In the [place] one drink equals one 12 oz bottle of beer (355 mL), one 5 oz glass of wine (148 mL), or one 1 oz glass of hard liquor (44 mL). Lifestyle Brush your teeth every morning and night with fluoride toothpaste. Floss one time each day. Exercise for at least 30 minutes 5 or more days each week. Do not use any products that contain nicotine or tobacco. These products include cigarettes, chewing tobacco, and vaping devices, such as e-cigarettes. If you need help quitting, ask your health care provider. Do not use drugs. If you are sexually active, practice safe sex. Use a condom or other form of protection to prevent STIs. Take aspirin only as told by your health care provider. Make sure that you understand how much to take and what form to take. Work with your health care provider to find out whether it is safe and beneficial for you to take aspirin daily. Ask your health care provider if you need to take a cholesterol-lowering medicine (statin). Find healthy ways to manage stress, such as: Meditation, yoga, or listening to  music. Journaling. Talking to a trusted person. Spending time with friends and family. Safety Always wear your seat belt while driving or riding in a vehicle. Do not drive: If you have been drinking alcohol. Do not ride with someone who has been drinking. When you are tired or distracted. While texting. If you have been using any mind-altering substances or drugs. Wear a helmet and other protective equipment during sports activities. If you have firearms in your house, make sure you follow all gun safety procedures. Minimize exposure to UV radiation to reduce your risk of skin cancer. What's next? Visit your health care provider once a year for an annual wellness visit. Ask your health care provider how often you should have your eyes and teeth checked. Stay up to date on all vaccines. This information  is not intended to replace advice given to you by your health care provider. Make sure you discuss any questions you have with your health care provider. Document Revised: 01/14/2021 Document Reviewed: 01/14/2021 Elsevier Patient Education  2024 Elsevier Inc.      Signed,   Meredith Staggers, MD [facility], [facility] 03/21/23 8:34 AM

## 2023-03-21 NOTE — Patient Instructions (Addendum)
Flu and covid vaccines when available.  I recommend eye specialist appointment at some point.  Keep up the good work with exercise and diet.  You are doing great.  Recheck with me in 6 months, but happy to see sooner if needed.  Take care.  Preventive Care 74 Years and Older, Male Preventive care refers to lifestyle choices and visits with your health care provider that can promote health and wellness. Preventive care visits are also called wellness exams. What can I expect for my preventive care visit? Counseling During your preventive care visit, your health care provider may ask about your: Medical history, including: Past medical problems. Family medical history. History of falls. Current health, including: Emotional well-being. Home life and relationship well-being. Sexual activity. Memory and ability to understand (cognition). Lifestyle, including: Alcohol, nicotine or tobacco, and drug use. Access to firearms. Diet, exercise, and sleep habits. Work and work Astronomer. Sunscreen use. Safety issues such as seatbelt and bike helmet use. Physical exam Your health care provider will check your: Height and weight. These may be used to calculate your BMI (body mass index). BMI is a measurement that tells if you are at a healthy weight. Waist circumference. This measures the distance around your waistline. This measurement also tells if you are at a healthy weight and may help predict your risk of certain diseases, such as type 2 diabetes and high blood pressure. Heart rate and blood pressure. Body temperature. Skin for abnormal spots. What immunizations do I need?  Vaccines are usually given at various ages, according to a schedule. Your health care provider will recommend vaccines for you based on your age, medical history, and lifestyle or other factors, such as travel or where you work. What tests do I need? Screening Your health care provider may recommend screening tests  for certain conditions. This may include: Lipid and cholesterol levels. Diabetes screening. This is done by checking your blood sugar (glucose) after you have not eaten for a while (fasting). Hepatitis C test. Hepatitis B test. HIV (human immunodeficiency virus) test. STI (sexually transmitted infection) testing, if you are at risk. Lung cancer screening. Colorectal cancer screening. Prostate cancer screening. Abdominal aortic aneurysm (AAA) screening. You may need this if you are a current or former smoker. Talk with your health care provider about your test results, treatment options, and if necessary, the need for more tests. Follow these instructions at home: Eating and drinking  Eat a diet that includes fresh fruits and vegetables, whole grains, lean protein, and low-fat dairy products. Limit your intake of foods with high amounts of sugar, saturated fats, and salt. Take vitamin and mineral supplements as recommended by your health care provider. Do not drink alcohol if your health care provider tells you not to drink. If you drink alcohol: Limit how much you have to 0-2 drinks a day. Know how much alcohol is in your drink. In the [place] one drink equals one 12 oz bottle of beer (355 mL), one 5 oz glass of wine (148 mL), or one 1 oz glass of hard liquor (44 mL). Lifestyle Brush your teeth every morning and night with fluoride toothpaste. Floss one time each day. Exercise for at least 30 minutes 5 or more days each week. Do not use any products that contain nicotine or tobacco. These products include cigarettes, chewing tobacco, and vaping devices, such as e-cigarettes. If you need help quitting, ask your health care provider. Do not use drugs. If you are sexually active, practice safe sex.  Use a condom or other form of protection to prevent STIs. Take aspirin only as told by your health care provider. Make sure that you understand how much to take and what form to take. Work with your  health care provider to find out whether it is safe and beneficial for you to take aspirin daily. Ask your health care provider if you need to take a cholesterol-lowering medicine (statin). Find healthy ways to manage stress, such as: Meditation, yoga, or listening to music. Journaling. Talking to a trusted person. Spending time with friends and family. Safety Always wear your seat belt while driving or riding in a vehicle. Do not drive: If you have been drinking alcohol. Do not ride with someone who has been drinking. When you are tired or distracted. While texting. If you have been using any mind-altering substances or drugs. Wear a helmet and other protective equipment during sports activities. If you have firearms in your house, make sure you follow all gun safety procedures. Minimize exposure to UV radiation to reduce your risk of skin cancer. What's next? Visit your health care provider once a year for an annual wellness visit. Ask your health care provider how often you should have your eyes and teeth checked. Stay up to date on all vaccines. This information is not intended to replace advice given to you by your health care provider. Make sure you discuss any questions you have with your health care provider. Document Revised: 01/14/2021 Document Reviewed: 01/14/2021 Elsevier Patient Education  2024 [employer].

## 2023-04-05 ENCOUNTER — Telehealth: Payer: Self-pay

## 2023-04-05 NOTE — Telephone Encounter (Signed)
-----   Message from Shade Flood sent at 04/05/2023  1:41 PM EDT ----- Results sent by MyChart, but appears patient has not yet reviewed those results.  Please call and make sure they have either seen note or discuss result note.  Thanks.

## 2023-04-05 NOTE — Telephone Encounter (Signed)
Left vm

## 2023-04-05 NOTE — Telephone Encounter (Signed)
Please advise 

## 2023-04-05 NOTE — Telephone Encounter (Signed)
Immunization History  Administered Date(s) Administered   Influenza Nasal 05/16/2014, 05/18/2016, 05/05/2017   Influenza,inj,Quad PF,6+ Mos 06/16/2015, 05/03/2019, 03/26/2022   Influenza-Unspecified 05/05/2020, 05/20/2021   Moderna Covid-19 Vaccine Bivalent Booster 38yrs & up 09/02/2021   Moderna Sars-Covid-2 Vaccination 10/18/2019, 11/20/2019, 06/19/2020, 12/23/2020   PNEUMOCOCCAL CONJUGATE-20 03/23/2022   Respiratory Syncytial Virus Vaccine,Recomb Aduvanted(Arexvy) 03/15/2023   Tdap 12/27/2007, 12/22/2018   Zoster Recombinant(Shingrix) 12/20/2017, 08/21/2018, 04/16/2019, 12/21/2019   It appears his last dose was in 2023.  I do recommend the updated COVID booster.

## 2023-07-13 ENCOUNTER — Telehealth: Payer: Self-pay

## 2023-07-13 NOTE — Telephone Encounter (Signed)
Called to schedule patient for a Welcome to Medicare visit prior to 11/2023 no answer at the time of call provided our call back number

## 2023-09-13 ENCOUNTER — Other Ambulatory Visit: Payer: Self-pay | Admitting: Family Medicine

## 2023-09-13 DIAGNOSIS — M109 Gout, unspecified: Secondary | ICD-10-CM

## 2023-09-13 NOTE — Telephone Encounter (Signed)
Requested Prescriptions   Pending Prescriptions Disp Refills   indomethacin (INDOCIN) 50 MG capsule [Pharmacy Med Name: INDOMETHACIN 50MG  CAPSULES] 60 capsule 1    Sig: TAKE 1 CAPSULE(50 MG) BY MOUTH THREE TIMES DAILY AS NEEDED FOR MILD PAIN OR GOUT     Date of patient request: 09/13/2023 Last office visit: 03/21/2023 Upcoming visit: 09/21/2023 Date of last refill: 03/21/2023 Last refill amount: 60

## 2023-09-13 NOTE — Telephone Encounter (Signed)
Gout discussed at his visit in August.  Indomethacin as needed, refilled.

## 2023-09-21 ENCOUNTER — Ambulatory Visit: Payer: Medicare Other | Admitting: Family Medicine

## 2023-10-24 ENCOUNTER — Encounter: Payer: Self-pay | Admitting: Family Medicine

## 2023-10-24 ENCOUNTER — Ambulatory Visit (INDEPENDENT_AMBULATORY_CARE_PROVIDER_SITE_OTHER): Admitting: Family Medicine

## 2023-10-24 ENCOUNTER — Encounter: Payer: Medicare Other | Admitting: Family Medicine

## 2023-10-24 VITALS — BP 128/68 | HR 91 | Temp 98.0°F | Ht 70.0 in | Wt 189.6 lb

## 2023-10-24 DIAGNOSIS — M109 Gout, unspecified: Secondary | ICD-10-CM | POA: Diagnosis not present

## 2023-10-24 DIAGNOSIS — E785 Hyperlipidemia, unspecified: Secondary | ICD-10-CM | POA: Diagnosis not present

## 2023-10-24 DIAGNOSIS — I10 Essential (primary) hypertension: Secondary | ICD-10-CM

## 2023-10-24 DIAGNOSIS — Z0000 Encounter for general adult medical examination without abnormal findings: Secondary | ICD-10-CM | POA: Diagnosis not present

## 2023-10-24 DIAGNOSIS — R7303 Prediabetes: Secondary | ICD-10-CM

## 2023-10-24 LAB — LIPID PANEL
Cholesterol: 113 mg/dL (ref 0–200)
HDL: 68.9 mg/dL (ref >39.00)
LDL Cholesterol: 18 mg/dL (ref 0–99)
NonHDL: 43.63
Total CHOL/HDL Ratio: 2
Triglycerides: 126 mg/dL (ref 0.0–149.0)
VLDL: 25.2 mg/dL (ref 0.0–40.0)

## 2023-10-24 LAB — COMPREHENSIVE METABOLIC PANEL
ALT: 503 U/L — ABNORMAL HIGH (ref 0–53)
AST: 2678 U/L — ABNORMAL HIGH (ref 0–37)
Albumin: 4.5 g/dL (ref 3.5–5.2)
Alkaline Phosphatase: 90 U/L (ref 39–117)
BUN: 10 mg/dL (ref 6–23)
CO2: 25 meq/L (ref 19–32)
Calcium: 9.4 mg/dL (ref 8.4–10.5)
Chloride: 101 meq/L (ref 96–112)
Creatinine, Ser: 0.84 mg/dL (ref 0.40–1.50)
GFR: 91.26 mL/min (ref >60.00)
Glucose, Bld: 107 mg/dL — ABNORMAL HIGH (ref 70–99)
Potassium: 3.8 meq/L (ref 3.5–5.1)
Sodium: 136 meq/L (ref 135–145)
Total Bilirubin: 1 mg/dL (ref 0.2–1.2)
Total Protein: 7.1 g/dL (ref 6.0–8.3)

## 2023-10-24 LAB — HEMOGLOBIN A1C: Hgb A1c MFr Bld: 6.1 % (ref 4.6–6.5)

## 2023-10-24 MED ORDER — AMLODIPINE BESYLATE 5 MG PO TABS: 5.0000 mg | ORAL_TABLET | Freq: Every day | ORAL | 2 refills | Status: DC

## 2023-10-24 MED ORDER — ALLOPURINOL 100 MG PO TABS
100.0000 mg | ORAL_TABLET | Freq: Every day | ORAL | 2 refills | Status: DC
Start: 2023-10-24 — End: 2024-04-25

## 2023-10-24 MED ORDER — LISINOPRIL 20 MG PO TABS: 20.0000 mg | ORAL_TABLET | Freq: Every day | ORAL | 2 refills | Status: DC

## 2023-10-24 MED ORDER — ATORVASTATIN CALCIUM 20 MG PO TABS: ORAL_TABLET | ORAL | 2 refills | Status: DC

## 2023-10-24 NOTE — Patient Instructions (Signed)
 No med changes today. Let me know if there are questions on the advanced directive paperwork. Take care!  Preventive Care 18 Years and Older, Male Preventive care refers to lifestyle choices and visits with your health care provider that can promote health and wellness. Preventive care visits are also called wellness exams. What can I expect for my preventive care visit? Counseling During your preventive care visit, your health care provider may ask about your: Medical history, including: Past medical problems. Family medical history. History of falls. Current health, including: Emotional well-being. Home life and relationship well-being. Sexual activity. Memory and ability to understand (cognition). Lifestyle, including: Alcohol, nicotine or tobacco, and drug use. Access to firearms. Diet, exercise, and sleep habits. Work and work Astronomer. Sunscreen use. Safety issues such as seatbelt and bike helmet use. Physical exam Your health care provider will check your: Height and weight. These may be used to calculate your BMI (body mass index). BMI is a measurement that tells if you are at a healthy weight. Waist circumference. This measures the distance around your waistline. This measurement also tells if you are at a healthy weight and may help predict your risk of certain diseases, such as type 2 diabetes and high blood pressure. Heart rate and blood pressure. Body temperature. Skin for abnormal spots. What immunizations do I need?  Vaccines are usually given at various ages, according to a schedule. Your health care provider will recommend vaccines for you based on your age, medical history, and lifestyle or other factors, such as travel or where you work. What tests do I need? Screening Your health care provider may recommend screening tests for certain conditions. This may include: Lipid and cholesterol levels. Diabetes screening. This is done by checking your blood sugar  (glucose) after you have not eaten for a while (fasting). Hepatitis C test. Hepatitis B test. HIV (human immunodeficiency virus) test. STI (sexually transmitted infection) testing, if you are at risk. Lung cancer screening. Colorectal cancer screening. Prostate cancer screening. Abdominal aortic aneurysm (AAA) screening. You may need this if you are a current or former smoker. Talk with your health care provider about your test results, treatment options, and if necessary, the need for more tests. Follow these instructions at home: Eating and drinking  Eat a diet that includes fresh fruits and vegetables, whole grains, lean protein, and low-fat dairy products. Limit your intake of foods with high amounts of sugar, saturated fats, and salt. Take vitamin and mineral supplements as recommended by your health care provider. Do not drink alcohol if your health care provider tells you not to drink. If you drink alcohol: Limit how much you have to 0-2 drinks a day. Know how much alcohol is in your drink. In the [place] one drink equals one 12 oz bottle of beer (355 mL), one 5 oz glass of wine (148 mL), or one 1 oz glass of hard liquor (44 mL). Lifestyle Brush your teeth every morning and night with fluoride toothpaste. Floss one time each day. Exercise for at least 30 minutes 5 or more days each week. Do not use any products that contain nicotine or tobacco. These products include cigarettes, chewing tobacco, and vaping devices, such as e-cigarettes. If you need help quitting, ask your health care provider. Do not use drugs. If you are sexually active, practice safe sex. Use a condom or other form of protection to prevent STIs. Take aspirin only as told by your health care provider. Make sure that you understand how much to  take and what form to take. Work with your health care provider to find out whether it is safe and beneficial for you to take aspirin daily. Ask your health care provider if you  need to take a cholesterol-lowering medicine (statin). Find healthy ways to manage stress, such as: Meditation, yoga, or listening to music. Journaling. Talking to a trusted person. Spending time with friends and family. Safety Always wear your seat belt while driving or riding in a vehicle. Do not drive: If you have been drinking alcohol. Do not ride with someone who has been drinking. When you are tired or distracted. While texting. If you have been using any mind-altering substances or drugs. Wear a helmet and other protective equipment during sports activities. If you have firearms in your house, make sure you follow all gun safety procedures. Minimize exposure to UV radiation to reduce your risk of skin cancer. What's next? Visit your health care provider once a year for an annual wellness visit. Ask your health care provider how often you should have your eyes and teeth checked. Stay up to date on all vaccines. This information is not intended to replace advice given to you by your health care provider. Make sure you discuss any questions you have with your health care provider. Document Revised: 01/14/2021 Document Reviewed: 01/14/2021 Elsevier Patient Education  2024 [employer].

## 2023-10-24 NOTE — Progress Notes (Signed)
 Subjective:  Patient ID: Daniel Neth., male    DOB: December 31, 1955  Age: 68 y.o. MRN: [id number]  CC:  Chief Complaint  Patient presents with   Medical Management of Chronic Issues    Pt doing well no questions today, pt is fasting    Presents for annual wellness exam.   Care team: PCP: me Urology: Dr. Berneice Heinrich  -  Had completed radiation for detectable PSA post robotic prostatectomy and sentinel lymph node dissection in June 2023 with pT3a N0 MX grade 3 cancer.  Erectile dysfunction treatment discussed with urology including Trimix as option. Appt 10/24. Psa downtrending - Q83mo PSA for 3 years planned. No meds for ED at this time. 6 month follow up - appt next month.  [facility] onc[facility] - [facility] [name]. Yearly visits.   Hypertension: Amlodipine 5 mg daily, lisinopril 20 mg daily without new medication side effects. EKG in May 2023.  Home readings: none  BP Readings from Last 3 Encounters:  10/24/23 128/68  03/21/23 128/72  11/08/22 118/78   Lab Results  Component Value Date   CREATININE 0.69 03/21/2023   Prediabetes: Diet/exercise approach.  Weight has improved by 4 pounds since last visit.  Avoiding sugary beverages.  Daily walking. Lab Results  Component Value Date   HGBA1C 5.9 03/21/2023   Wt Readings from Last 3 Encounters:  10/24/23 189 lb 9.6 oz (86 kg)  03/21/23 193 lb 6.4 oz (87.7 kg)  11/08/22 194 lb 9.6 oz (88.3 kg)   Hyperlipidemia: Lipitor 20 mg daily without new myalgias or side effects. Lab Results  Component Value Date   CHOL 136 03/21/2023   HDL 67.90 03/21/2023   LDLCALC 54 09/12/2020   LDLDIRECT 35.0 03/21/2023   TRIG 302.0 (H) 03/21/2023   CHOLHDL 2 03/21/2023   Lab Results  Component Value Date   ALT 17 03/21/2023   AST 45 (H) 03/21/2023   ALKPHOS 66 03/21/2023   BILITOT 0.4 03/21/2023    Gout: Last flare: No recent flares. Daily meds: Allopurinol 100 mg daily Prn med: Indomethacin if needed Lab  Results  Component Value Date   LABURIC 5.6 03/26/2020    Fall screening    10/24/2023    8:48 AM 03/21/2023    8:13 AM 09/16/2022    8:17 AM 03/26/2022    8:23 AM 09/14/2021    8:07 AM  Fall Risk   Falls in the past year?  0 0 0 0  Number falls in past yr: 0 0 0 0 0  Injury with Fall? 0 0 0 0 0  Risk for fall due to : No Fall Risks No Fall Risks No Fall Risks No Fall Risks No Fall Risks  Follow up Falls evaluation completed Falls evaluation completed Falls evaluation completed Falls evaluation completed Falls evaluation completed   Lighting in home: adequate Loose rugs/carpets/pets: no loose rugs. No pets.  Stairs: single level. Grab bars in bathroom:none yet.  Timed up and go:8 seconds, normal gait, no instability.   Depression Screening:    10/24/2023    8:48 AM 03/21/2023    8:13 AM 11/08/2022    1:55 PM 09/16/2022    8:17 AM 03/26/2022    8:23 AM  Depression screen PHQ 2/9  Decreased Interest 0 0 0 0 0  Down, Depressed, Hopeless 0 0 0 0 0  PHQ - 2 Score 0 0 0 0 0  Altered sleeping 0 0   0  Tired, decreased energy 0  0   0  Change in appetite 0 0   0  Feeling bad or failure about yourself  0 0   0  Trouble concentrating 0 0   0  Moving slowly or fidgety/restless 0 0   0  Suicidal thoughts 0 0   0  PHQ-9 Score 0 0   0    Cancer Screening: Colonoscopy 09/09/22. Dr, Myrtie Neither, repeat in 7 years. Adenomatous polyps.  Hx of prostate CA - followed by urology as above.  No new skin lesions.   Immunization History  Administered Date(s) Administered   Influenza Nasal 05/16/2014, 05/18/2016, 05/05/2017   Influenza,inj,Quad PF,6+ Mos 06/16/2015, 05/03/2019, 03/26/2022   Influenza-Unspecified 05/05/2020, 05/20/2021, 05/22/2023   Moderna Covid-19 Vaccine Bivalent Booster 33yrs & up 09/02/2021   Moderna Sars-Covid-2 Vaccination 10/18/2019, 11/20/2019, 06/19/2020, 12/23/2020   PNEUMOCOCCAL CONJUGATE-20 03/23/2022   Respiratory Syncytial Virus Vaccine,Recomb Aduvanted(Arexvy)  03/15/2023   Tdap 12/27/2007, 12/22/2018   Zoster Recombinant(Shingrix) 12/20/2017, 08/21/2018, 04/16/2019, 12/21/2019  Did have covid booster last fall - [employer] or pharmacy UTD on vaccines.   Functional Status Survey: Is the patient deaf or have difficulty hearing?: No Does the patient have difficulty seeing, even when wearing glasses/contacts?: No Does the patient have difficulty concentrating, remembering, or making decisions?: No Does the patient have difficulty walking or climbing stairs?: No Does the patient have difficulty dressing or bathing?: No Does the patient have difficulty doing errands alone such as visiting a doctor's office or shopping?: No  Memory Screen:    10/24/2023    9:37 AM  6CIT Screen  What Year? 0 points  What month? 0 points  What time? 0 points  Count back from 20 0 points  Months in reverse 0 points  Repeat phrase 0 points  Total Score 0 points    Alcohol Screening: Flowsheet Row Office Visit from 10/24/2023 in [facility]  AUDIT-C Score 6      3 drinks per week. Has cut back. Denies anger or agitation with discussion of alcohol. Guilt in past only. No eye opener needed.   Tobacco: None.   Vision Screening   Right eye Left eye Both eyes  Without correction 20/25 20/25 20/25   With correction      Optho/optometry: few years ago.   Dental: In past 6 months  Exercise: Walking daily - hours.   Advanced Directives:  Does not have - agrees to paperwork.     History Patient Active Problem List   Diagnosis Date Noted   Hyperlipidemia 09/16/2022   Prediabetes 09/16/2022   Prostate cancer (HCC) 01/08/2022   Nonspecific abnormal finding in stool contents 02/28/2015   Family history of cancer 02/28/2015   OA (osteoarthritis) 02/28/2012   Essential hypertension 11/27/2011   Lipid disorder 11/27/2011   Gout of multiple sites 11/27/2011   Past Medical History:  Diagnosis Date   Arthritis     Cancer (HCC) 12/2021   prostate   Colon polyps    Gout attack    Hyperlipidemia    Hypertension    Past Surgical History:  Procedure Laterality Date   COLONOSCOPY  03/2015   FRACTURE SURGERY  08/02/1998   LEFT ankle, ORIF   LYMPH NODE DISSECTION Bilateral 01/08/2022   Procedure: LYMPH NODE DISSECTION;  Surgeon: Sebastian Ache, MD;  Location: [facility];  Service: Urology;  Laterality: Bilateral;   ROBOT ASSISTED LAPAROSCOPIC RADICAL PROSTATECTOMY N/A 01/08/2022   Procedure: XI ROBOTIC ASSISTED LAPAROSCOPIC RADICAL PROSTATECTOMY AND INDOCYANINE GREEN DYE;  Surgeon: Sebastian Ache,  MD;  Location: [facility];  Service: Urology;  Laterality: N/A;   WISDOM TOOTH EXTRACTION     Allergies  Allergen Reactions   Gadolinium Derivatives Hives    11/18/17 developed 4-5 hives on neck and face; no other complaints.  Treated with Benadryl 25mg  PO (pt given 2 more caps to take with him for 3.5 mile drive home).  Needs Benadryl 50mg  PO pre-med prior to next MRI w/ gad, per Dr. Paulina Fusi.   Methocarbamol Hives   Prior to Admission medications   Medication Sig Start Date End Date Taking? Authorizing Provider  allopurinol (ZYLOPRIM) 100 MG tablet Take 1 tablet (100 mg total) by mouth daily. 03/21/23  Yes Shade Flood, MD  amLODipine (NORVASC) 5 MG tablet Take 1 tablet (5 mg total) by mouth daily. 03/21/23  Yes Shade Flood, MD  aspirin EC 81 MG tablet Take 81 mg by mouth daily. Swallow whole.   Yes [provider]  atorvastatin (LIPITOR) 20 MG tablet take 1 tablet by mouth at bedtime 03/21/23  Yes Shade Flood, MD  docusate sodium (COLACE) 100 MG capsule Take 1 capsule (100 mg total) by mouth 2 (two) times daily. 01/08/22  Yes Dancy, Marchelle Folks, PA-C  ferrous sulfate 325 (65 FE) MG tablet Take 325 mg by mouth daily with breakfast.   Yes [provider]  indomethacin (INDOCIN) 50 MG capsule TAKE 1 CAPSULE(50 MG) BY MOUTH THREE TIMES DAILY AS NEEDED FOR MILD PAIN OR GOUT 09/13/23  Yes  Shade Flood, MD  lisinopril (ZESTRIL) 20 MG tablet Take 1 tablet (20 mg total) by mouth daily. 03/21/23  Yes Shade Flood, MD   Social History   Socioeconomic History   Marital status: Divorced    Spouse name: Nelma Rothman   Number of children: 1   Years of education: 14+   Highest education level: Not on file  Occupational History   Occupation: SHIFT SUPERVISOR    Employer: [employer]  Tobacco Use   Smoking status: Never   Smokeless tobacco: Never  Vaping Use   Vaping status: Never Used  Substance and Sexual Activity   Alcohol use: Yes    Alcohol/week: 3.0 standard drinks of alcohol    Types: 3 Standard drinks or equivalent per week    Comment: 3-5 drinks   Drug use: No   Sexual activity: Yes    Partners: Female  Other Topics Concern   Not on file  Social History Narrative   Lives with his wife and their daughter.   Exercise: Walking   Education: some Automotive engineer   Social Drivers of Health   Financial Resource Strain: Low Risk  (10/20/2023)   Overall Financial Resource Strain (CARDIA)    Difficulty of Paying Living Expenses: Not hard at all  Food Insecurity: No Food Insecurity (10/20/2023)   Hunger Vital Sign    Worried About Running Out of Food in the Last Year: Never true    Ran Out of Food in the Last Year: Never true  Transportation Needs: Unknown (10/20/2023)   PRAPARE - Administrator, Civil Service (Medical): Patient declined    Lack of Transportation (Non-Medical): No  Physical Activity: Not on file  Stress: Not on file  Social Connections: Unknown (10/20/2023)   Social Connection and Isolation Panel [NHANES]    Frequency of Communication with Friends and Family: Twice a week    Frequency of Social Gatherings with Friends and Family: Twice a week    Attends Religious Services: More  than 4 times per year    Active Member of Clubs or Organizations: Not on file    Attends Banker Meetings: Not on file    Marital Status: Divorced   Intimate Partner Violence: Not At Risk (11/08/2022)   Humiliation, Afraid, Rape, and Kick questionnaire    Fear of Current or Ex-Partner: No    Emotionally Abused: No    Physically Abused: No    Sexually Abused: No    Review of Systems 13 point review of systems per patient health survey noted.  Negative other than as indicated above or in HPI.    Objective:   Vitals:   10/24/23 0847  BP: 128/68  Pulse: 91  Temp: 98 F (36.7 C)  TempSrc: Temporal  SpO2: 99%  Weight: 189 lb 9.6 oz (86 kg)  Height: 5\' 10"  (1.778 m)     Physical Exam Vitals reviewed.  Constitutional:      Appearance: He is well-developed.  HENT:     Head: Normocephalic and atraumatic.     Right Ear: External ear normal.     Left Ear: External ear normal.  Eyes:     Conjunctiva/sclera: Conjunctivae normal.     Pupils: Pupils are equal, round, and reactive to light.  Neck:     Thyroid: No thyromegaly.  Cardiovascular:     Rate and Rhythm: Normal rate and regular rhythm.     Heart sounds: Normal heart sounds.  Pulmonary:     Effort: Pulmonary effort is normal. No respiratory distress.     Breath sounds: Normal breath sounds. No wheezing.  Abdominal:     General: There is no distension.     Palpations: Abdomen is soft.     Tenderness: There is no abdominal tenderness.  Musculoskeletal:        General: No tenderness. Normal range of motion.     Cervical back: Normal range of motion and neck supple.  Lymphadenopathy:     Cervical: No cervical adenopathy.  Skin:    General: Skin is warm and dry.  Neurological:     Mental Status: He is alert and oriented to person, place, and time.     Deep Tendon Reflexes: Reflexes are normal and symmetric.  Psychiatric:        Behavior: Behavior normal.        Assessment & Plan:  Josey Forcier. is a 68 y.o. male . Welcome to Medicare preventive visit  - - anticipatory guidance as below in AVS, screening labs if needed. Health maintenance items as above  in HPI discussed/recommended as applicable.  - no concerning responses on depression, fall, or functional status screening. Any positive responses noted as above. Advanced directives discussed, paperwork provided.  Gout of multiple sites, unspecified cause, unspecified chronicity - Plan: allopurinol (ZYLOPRIM) 100 MG tablet  -Stable on current regimen without recent flares, continue allopurinol same dose.  Essential hypertension - Plan: amLODipine (NORVASC) 5 MG tablet, lisinopril (ZESTRIL) 20 MG tablet  -Stable with current dose of Norvasc, lisinopril.  Continue same.  Check labs and adjust plan accordingly.  Hyperlipidemia, unspecified hyperlipidemia type - Plan: Comprehensive metabolic panel, Lipid panel, atorvastatin (LIPITOR) 20 MG tablet  -Tolerating current dose statin, continue same, check labs and adjust plan accordingly.  Prediabetes - Plan: Hemoglobin A1c  -Commended on weight loss, check A1c.    Meds ordered this encounter  Medications   allopurinol (ZYLOPRIM) 100 MG tablet    Sig: Take 1 tablet (100 mg total) by mouth daily.  Dispense:  90 tablet    Refill:  2   amLODipine (NORVASC) 5 MG tablet    Sig: Take 1 tablet (5 mg total) by mouth daily.    Dispense:  90 tablet    Refill:  2   atorvastatin (LIPITOR) 20 MG tablet    Sig: take 1 tablet by mouth at bedtime    Dispense:  90 tablet    Refill:  2   lisinopril (ZESTRIL) 20 MG tablet    Sig: Take 1 tablet (20 mg total) by mouth daily.    Dispense:  90 tablet    Refill:  2   Patient Instructions  No med changes today. Let me know if there are questions on the advanced directive paperwork. Take care!  Preventive Care 32 Years and Older, Male Preventive care refers to lifestyle choices and visits with your health care provider that can promote health and wellness. Preventive care visits are also called wellness exams. What can I expect for my preventive care visit? Counseling During your preventive care visit,  your health care provider may ask about your: Medical history, including: Past medical problems. Family medical history. History of falls. Current health, including: Emotional well-being. Home life and relationship well-being. Sexual activity. Memory and ability to understand (cognition). Lifestyle, including: Alcohol, nicotine or tobacco, and drug use. Access to firearms. Diet, exercise, and sleep habits. Work and work Astronomer. Sunscreen use. Safety issues such as seatbelt and bike helmet use. Physical exam Your health care provider will check your: Height and weight. These may be used to calculate your BMI (body mass index). BMI is a measurement that tells if you are at a healthy weight. Waist circumference. This measures the distance around your waistline. This measurement also tells if you are at a healthy weight and may help predict your risk of certain diseases, such as type 2 diabetes and high blood pressure. Heart rate and blood pressure. Body temperature. Skin for abnormal spots. What immunizations do I need?  Vaccines are usually given at various ages, according to a schedule. Your health care provider will recommend vaccines for you based on your age, medical history, and lifestyle or other factors, such as travel or where you work. What tests do I need? Screening Your health care provider may recommend screening tests for certain conditions. This may include: Lipid and cholesterol levels. Diabetes screening. This is done by checking your blood sugar (glucose) after you have not eaten for a while (fasting). Hepatitis C test. Hepatitis B test. HIV (human immunodeficiency virus) test. STI (sexually transmitted infection) testing, if you are at risk. Lung cancer screening. Colorectal cancer screening. Prostate cancer screening. Abdominal aortic aneurysm (AAA) screening. You may need this if you are a current or former smoker. Talk with your health care provider  about your test results, treatment options, and if necessary, the need for more tests. Follow these instructions at home: Eating and drinking  Eat a diet that includes fresh fruits and vegetables, whole grains, lean protein, and low-fat dairy products. Limit your intake of foods with high amounts of sugar, saturated fats, and salt. Take vitamin and mineral supplements as recommended by your health care provider. Do not drink alcohol if your health care provider tells you not to drink. If you drink alcohol: Limit how much you have to 0-2 drinks a day. Know how much alcohol is in your drink. In the [place] one drink equals one 12 oz bottle of beer (355 mL), one 5 oz glass of wine (  148 mL), or one 1 oz glass of hard liquor (44 mL). Lifestyle Brush your teeth every morning and night with fluoride toothpaste. Floss one time each day. Exercise for at least 30 minutes 5 or more days each week. Do not use any products that contain nicotine or tobacco. These products include cigarettes, chewing tobacco, and vaping devices, such as e-cigarettes. If you need help quitting, ask your health care provider. Do not use drugs. If you are sexually active, practice safe sex. Use a condom or other form of protection to prevent STIs. Take aspirin only as told by your health care provider. Make sure that you understand how much to take and what form to take. Work with your health care provider to find out whether it is safe and beneficial for you to take aspirin daily. Ask your health care provider if you need to take a cholesterol-lowering medicine (statin). Find healthy ways to manage stress, such as: Meditation, yoga, or listening to music. Journaling. Talking to a trusted person. Spending time with friends and family. Safety Always wear your seat belt while driving or riding in a vehicle. Do not drive: If you have been drinking alcohol. Do not ride with someone who has been drinking. When you are tired or  distracted. While texting. If you have been using any mind-altering substances or drugs. Wear a helmet and other protective equipment during sports activities. If you have firearms in your house, make sure you follow all gun safety procedures. Minimize exposure to UV radiation to reduce your risk of skin cancer. What's next? Visit your health care provider once a year for an annual wellness visit. Ask your health care provider how often you should have your eyes and teeth checked. Stay up to date on all vaccines. This information is not intended to replace advice given to you by your health care provider. Make sure you discuss any questions you have with your health care provider. Document Revised: 01/14/2021 Document Reviewed: 01/14/2021 Elsevier Patient Education  2024 [employer]    Signed,   Meredith Staggers, MD [facility] 10/24/23 10:34 AM

## 2023-10-25 ENCOUNTER — Other Ambulatory Visit: Payer: Self-pay | Admitting: Family Medicine

## 2023-10-25 DIAGNOSIS — R7989 Other specified abnormal findings of blood chemistry: Secondary | ICD-10-CM

## 2023-10-25 NOTE — Progress Notes (Signed)
 See lab results, stat hepatic function panel tomorrow morning at [facility].

## 2023-10-26 ENCOUNTER — Telehealth: Payer: Self-pay | Admitting: Family Medicine

## 2023-10-26 NOTE — Telephone Encounter (Signed)
 Called patient. He has not had bloodwork yet today. He researched his meds today and concerned that some of his meds may be an issue, but unfortunately did not have lab work today.  He denies any abdominal pain, nausea, vomiting, yellowing of the eyes or confusion. One of his questions was regarding why he is on 2 different blood pressure medications, and we discussed reasons as well as mechanisms of how his 2 separate blood pressure medications work.  I also discussed reason for urgent recheck of his liver function test given the elevated reading yesterday even though he is asymptomatic.  Understanding expressed and he plans to have blood work done tomorrow.  Option of ER evaluation given tonight, but declined again in favor of repeat labs tomorrow as he is asymptomatic.  ER precautions given.  Hold atorvastatin for now.

## 2023-10-27 ENCOUNTER — Other Ambulatory Visit (INDEPENDENT_AMBULATORY_CARE_PROVIDER_SITE_OTHER)

## 2023-10-27 DIAGNOSIS — R7989 Other specified abnormal findings of blood chemistry: Secondary | ICD-10-CM

## 2023-10-27 LAB — HEPATIC FUNCTION PANEL
ALT: 103 U/L — ABNORMAL HIGH (ref 0–53)
AST: 93 U/L — ABNORMAL HIGH (ref 0–37)
Albumin: 4.2 g/dL (ref 3.5–5.2)
Alkaline Phosphatase: 85 U/L (ref 39–117)
Bilirubin, Direct: 0.1 mg/dL (ref 0.0–0.3)
Total Bilirubin: 0.6 mg/dL (ref 0.2–1.2)
Total Protein: 6.7 g/dL (ref 6.0–8.3)

## 2023-10-27 NOTE — Progress Notes (Unsigned)
 Daniel Davis

## 2023-10-28 ENCOUNTER — Telehealth: Payer: Self-pay

## 2023-10-28 ENCOUNTER — Other Ambulatory Visit: Payer: Self-pay

## 2023-10-28 DIAGNOSIS — R7989 Other specified abnormal findings of blood chemistry: Secondary | ICD-10-CM

## 2023-10-28 NOTE — Telephone Encounter (Signed)
-----   Message from Shade Flood sent at 10/28/2023  1:42 PM EDT ----- Call patient.  Good news, previous very high liver function tests have improved.  I suspect that he may have had a temporary infection from his symptoms last week.  They are still elevated, so would like to make sure those return to normal.  Please schedule lab only visit in 1 week for repeat hepatic function panel, diagnosis elevated LFTs.  Please let me know if he has any questions or any new symptoms in the meantime and please take him for having this blood work done.

## 2023-10-31 NOTE — Telephone Encounter (Signed)
 1 week lab visit has been scheduled for April 3rd. Did go over lab results as well.   Lab results have been discussed.   Verbalized understanding? Yes  Are there any questions? No

## 2023-11-03 ENCOUNTER — Other Ambulatory Visit (INDEPENDENT_AMBULATORY_CARE_PROVIDER_SITE_OTHER)

## 2023-11-03 ENCOUNTER — Other Ambulatory Visit

## 2023-11-03 ENCOUNTER — Encounter: Payer: Self-pay | Admitting: Family Medicine

## 2023-11-03 DIAGNOSIS — R7989 Other specified abnormal findings of blood chemistry: Secondary | ICD-10-CM

## 2023-11-03 LAB — HEPATIC FUNCTION PANEL
ALT: 34 U/L (ref 0–53)
AST: 52 U/L — ABNORMAL HIGH (ref 0–37)
Albumin: 4 g/dL (ref 3.5–5.2)
Alkaline Phosphatase: 64 U/L (ref 39–117)
Bilirubin, Direct: 0 mg/dL (ref 0.0–0.3)
Total Bilirubin: 0.4 mg/dL (ref 0.2–1.2)
Total Protein: 6.5 g/dL (ref 6.0–8.3)

## 2023-11-08 NOTE — Telephone Encounter (Signed)
 Called patient to relay lab results, left VM to return call or can see thes results via mychart.

## 2023-11-08 NOTE — Telephone Encounter (Signed)
-----   Message from Shade Flood sent at 11/08/2023 11:23 AM EDT ----- Results sent by MyChart, but appears patient has not yet reviewed those results.  Please call and make sure they have either seen note or discuss result note.  Thanks.

## 2023-11-09 NOTE — Telephone Encounter (Signed)
 Called patient to discuss lab work, no answer, left a message for the patient to call back to discuss or review by MyChart if that is prefered.

## 2023-11-11 NOTE — Telephone Encounter (Signed)
 Lab results have been discussed.   Verbalized understanding? Yes  Are there any questions? No

## 2023-11-22 ENCOUNTER — Telehealth: Payer: Self-pay

## 2023-11-22 NOTE — Telephone Encounter (Signed)
 Copied from CRM [id number]. Topic: General - Billing Inquiry >> Nov 22, 2023 11:16 AM Deaijah H wrote: Reason for CRM: Patient called in to advise Anything on account before Apr 1st of his visit needs to be put up under [employer]. Anything after Apr 1st needs to be charged to [employer] for life. Would like to have some type of confirmation regarding the info given has been done.

## 2023-11-22 NOTE — Telephone Encounter (Signed)
 Called patient and informed him that this has been taken care of. Patient verbalized understanding.

## 2024-01-10 ENCOUNTER — Other Ambulatory Visit: Payer: Self-pay | Admitting: Family Medicine

## 2024-01-10 DIAGNOSIS — I10 Essential (primary) hypertension: Secondary | ICD-10-CM

## 2024-02-13 NOTE — Progress Notes (Signed)
 This encounter was created in error - please disregard.

## 2024-03-08 ENCOUNTER — Other Ambulatory Visit (HOSPITAL_COMMUNITY): Payer: Self-pay

## 2024-03-27 ENCOUNTER — Telehealth: Payer: Self-pay

## 2024-03-27 NOTE — Telephone Encounter (Unsigned)
 Copied from CRM [id number]. Topic: General - Other >> Mar 27, 2024  9:54 AM Ismael A wrote: Reason for CRM: patient stating he received a call from [place], no notes on chart regarding call - call back if needed to confirm

## 2024-04-03 ENCOUNTER — Ambulatory Visit (INDEPENDENT_AMBULATORY_CARE_PROVIDER_SITE_OTHER): Payer: TRICARE For Life (TFL) | Admitting: *Deleted

## 2024-04-03 VITALS — Ht 70.5 in | Wt 192.0 lb

## 2024-04-03 DIAGNOSIS — Z0000 Encounter for general adult medical examination without abnormal findings: Secondary | ICD-10-CM | POA: Diagnosis not present

## 2024-04-03 NOTE — Progress Notes (Signed)
 Subjective:   Daniel T Axyl Sitzman. is a 68 y.o. male who presents for Medicare Annual/Subsequent preventive examination.  Visit Complete: Virtual I connected with  Daniel Davis. on 04/03/24 by a video and audio enabled telemedicine application and verified that I am speaking with the correct person using two identifiers.  Patient Location: Home  Provider Location: Home Office  I discussed the limitations of evaluation and management by telemedicine. The patient expressed understanding and agreed to proceed.  Vital Signs: Because this visit was a virtual/telehealth visit, some criteria may be missing or patient reported. Any vitals not documented were not able to be obtained and vitals that have been documented are patient reported.   Cardiac Risk Factors include: advanced age (>3men, >2 women);male gender;family history of premature cardiovascular disease;hypertension;obesity (BMI >30kg/m2)     Objective:    Today's Vitals   04/03/24 0819  Weight: 192 lb (87.1 kg)  Height: 5' 10.5 (1.791 m)   Body mass index is 27.16 kg/m.     04/03/2024    8:31 AM 04/03/2024    8:13 AM 11/08/2022    1:45 PM 01/08/2022    3:30 PM 12/30/2021    2:19 PM  Advanced Directives  Does Patient Have a Medical Advance Directive? No No Yes No No  Type of Advance Directive   Living will;Healthcare Power of Attorney    Would patient like information on creating a medical advance directive? No - Patient declined No - Patient declined  No - Patient declined No - Patient declined    Current Medications (verified) Outpatient Encounter Medications as of 04/03/2024  Medication Sig   allopurinol  (ZYLOPRIM ) 100 MG tablet Take 1 tablet (100 mg total) by mouth daily.   amLODipine  (NORVASC ) 5 MG tablet Take 1 tablet (5 mg total) by mouth daily.   aspirin EC 81 MG tablet Take 81 mg by mouth daily. Swallow whole.   atorvastatin  (LIPITOR) 20 MG tablet take 1 tablet by mouth at bedtime   docusate sodium  (COLACE) 100  MG capsule Take 1 capsule (100 mg total) by mouth 2 (two) times daily.   ferrous sulfate 325 (65 FE) MG tablet Take 325 mg by mouth daily with breakfast.   indomethacin  (INDOCIN ) 50 MG capsule TAKE 1 CAPSULE(50 MG) BY MOUTH THREE TIMES DAILY AS NEEDED FOR MILD PAIN OR GOUT   lisinopril  (ZESTRIL ) 20 MG tablet TAKE 1 TABLET(20 MG) BY MOUTH DAILY   No facility-administered encounter medications on file as of 04/03/2024.    Allergies (verified) Gadolinium derivatives and Methocarbamol   History: Past Medical History:  Diagnosis Date   Arthritis    Cancer (HCC) 12/2021   prostate   Colon polyps    Gout attack    Hyperlipidemia    Hypertension    Past Surgical History:  Procedure Laterality Date   COLONOSCOPY  03/2015   FRACTURE SURGERY  08/02/1998   LEFT ankle, ORIF   LYMPH NODE DISSECTION Bilateral 01/08/2022   Procedure: LYMPH NODE DISSECTION;  Surgeon: Alvaro Hummer, MD;  Location: [facility];  Service: Urology;  Laterality: Bilateral;   ROBOT ASSISTED LAPAROSCOPIC RADICAL PROSTATECTOMY N/A 01/08/2022   Procedure: XI ROBOTIC ASSISTED LAPAROSCOPIC RADICAL PROSTATECTOMY AND INDOCYANINE GREEN DYE;  Surgeon: Alvaro Hummer, MD;  Location: [facility];  Service: Urology;  Laterality: N/A;   WISDOM TOOTH EXTRACTION     Family History  Problem Relation Age of Onset   Diabetes Mother    Colon cancer Mother 58   Hypertension Mother  Hypertension Father    Heart disease Father    Stroke Father    Social History   Socioeconomic History   Marital status: Divorced    Spouse name: Jinnie   Number of children: 1   Years of education: 14+   Highest education level: Not on file  Occupational History   Occupation: SHIFT SUPERVISOR    Employer: [employer]  Tobacco Use   Smoking status: Never   Smokeless tobacco: Never  Vaping Use   Vaping status: Never Used  Substance and Sexual Activity   Alcohol use: Yes    Alcohol/week: 3.0 standard drinks of alcohol    Types: 3 Standard  drinks or equivalent per week    Comment: 3-5 drinks   Drug use: No   Sexual activity: Yes    Partners: Female  Other Topics Concern   Not on file  Social History Narrative   Lives with his wife and their daughter.   Exercise: Walking   Education: some Automotive engineer   Social Drivers of Health   Financial Resource Strain: Low Risk  (04/03/2024)   Overall Financial Resource Strain (CARDIA)    Difficulty of Paying Living Expenses: Not hard at all  Food Insecurity: No Food Insecurity (04/03/2024)   Hunger Vital Sign    Worried About Running Out of Food in the Last Year: Never true    Ran Out of Food in the Last Year: Never true  Transportation Needs: No Transportation Needs (04/03/2024)   PRAPARE - Administrator, Civil Service (Medical): No    Lack of Transportation (Non-Medical): No  Physical Activity: Insufficiently Active (04/03/2024)   Exercise Vital Sign    Days of Exercise per Week: 3 days    Minutes of Exercise per Session: 30 min  Stress: No Stress Concern Present (04/03/2024)   [employer] of Occupational Health - Occupational Stress Questionnaire    Feeling of Stress: Not at all  Social Connections: Moderately Integrated (04/03/2024)   Social Connection and Isolation Panel    Frequency of Communication with Friends and Family: More than three times a week    Frequency of Social Gatherings with Friends and Family: Three times a week    Attends Religious Services: More than 4 times per year    Active Member of Clubs or Organizations: Yes    Attends Engineer, structural: More than 4 times per year    Marital Status: Divorced    Tobacco Counseling Counseling given: Not Answered   Clinical Intake:  Pre-visit preparation completed: Yes  Pain : No/denies pain     Diabetes: No  How often do you need to have someone help you when you read instructions, pamphlets, or other written materials from your doctor or pharmacy?: 1 - Never  Interpreter Needed?:  No  Information entered by :: Mliss Graff LPN   Activities of Daily Living    04/03/2024    8:15 AM 10/24/2023    9:37 AM  In your present state of health, do you have any difficulty performing the following activities:  Hearing? 0 0  Vision? 0 0  Difficulty concentrating or making decisions? 0 0  Walking or climbing stairs? 0 0  Dressing or bathing? 0 0  Doing errands, shopping?  0  Preparing Food and eating ? N   Using the Toilet? N   In the past six months, have you accidently leaked urine? N   Do you have problems with loss of bowel control? N  Managing your Medications? N   Managing your Finances? N   Housekeeping or managing your Housekeeping? N     Patient Care Team: Levora Reyes SAUNDERS, MD as PCP - General (Family Medicine)  Indicate any recent Medical Services you may have received from other than [facility] providers in the past year (date may be approximate).     Assessment:   This is a routine wellness examination for Fraser.  Hearing/Vision screen Hearing Screening - Comments:: No trouble hearing Vision Screening - Comments:: Not up to date   Goals Addressed             This Visit's Progress    Patient Stated       Reduce alchol       Depression Screen    04/03/2024    8:19 AM 10/24/2023    8:48 AM 03/21/2023    8:13 AM 11/08/2022    1:55 PM 09/16/2022    8:17 AM 03/26/2022    8:23 AM 09/14/2021    8:07 AM  PHQ 2/9 Scores  PHQ - 2 Score 0 0 0 0 0 0 1  PHQ- 9 Score 0 0 0   0     Fall Risk    04/03/2024    8:13 AM 10/24/2023    8:48 AM 03/21/2023    8:13 AM 09/16/2022    8:17 AM 03/26/2022    8:23 AM  Fall Risk   Falls in the past year? 0  0 0 0  Number falls in past yr: 0 0 0 0 0  Injury with Fall? 0 0 0 0 0  Risk for fall due to :  No Fall Risks No Fall Risks No Fall Risks No Fall Risks  Follow up Falls evaluation completed;Education provided;Falls prevention discussed Falls evaluation completed Falls evaluation completed Falls evaluation completed  Falls evaluation completed      Data saved with a previous flowsheet row definition    MEDICARE RISK AT HOME: Medicare Risk at Home Any stairs in or around the home?: No If so, are there any without handrails?: No Home free of loose throw rugs in walkways, pet beds, electrical cords, etc?: Yes Adequate lighting in your home to reduce risk of falls?: Yes Life alert?: No Use of a cane, walker or w/c?: No Grab bars in the bathroom?: Yes Shower chair or bench in shower?: No Elevated toilet seat or a handicapped toilet?: No  TIMED UP AND GO:  Was the test performed?  No    Cognitive Function:        04/03/2024    8:14 AM 10/24/2023    9:37 AM  6CIT Screen  What Year? 0 points 0 points  What month? 0 points 0 points  What time? 0 points 0 points  Count back from 20 0 points 0 points  Months in reverse 0 points 0 points  Repeat phrase 0 points 0 points  Total Score 0 points 0 points    Immunizations Immunization History  Administered Date(s) Administered   Influenza Nasal 05/16/2014, 05/18/2016, 05/05/2017   Influenza,inj,Quad PF,6+ Mos 06/16/2015, 05/03/2019, 03/26/2022   Influenza-Unspecified 05/05/2020, 05/20/2021, 05/22/2023   Moderna Covid-19 Vaccine Bivalent Booster 10yrs & up 09/02/2021   Moderna Sars-Covid-2 Vaccination 10/18/2019, 11/20/2019, 06/19/2020, 12/23/2020   PNEUMOCOCCAL CONJUGATE-20 03/23/2022   Respiratory Syncytial Virus Vaccine,Recomb Aduvanted(Arexvy) 03/15/2023   Tdap 12/27/2007, 12/22/2018   Zoster Recombinant(Shingrix) 12/20/2017, 08/21/2018, 04/16/2019, 12/21/2019    TDAP status: Up to date  Flu Vaccine status: Due, Education has been provided  regarding the importance of this vaccine. Advised may receive this vaccine at local pharmacy or Health Dept. Aware to provide a copy of the vaccination record if obtained from local pharmacy or Health Dept. Verbalized acceptance and understanding.  Pneumococcal vaccine status: Up to date  Covid-19  vaccine status: Information provided on how to obtain vaccines.   Qualifies for Shingles Vaccine? No   Zostavax completed Yes   Shingrix Completed?: Yes  Screening Tests Health Maintenance  Topic Date Due   INFLUENZA VACCINE  03/02/2024   COVID-19 Vaccine (7 - 2025-26 season) 04/02/2024   Medicare Annual Wellness (AWV)  04/03/2025   DTaP/Tdap/Td (3 - Td or Tdap) 12/21/2028   Colonoscopy  09/09/2029   Pneumococcal Vaccine: 50+ Years  Completed   Hepatitis C Screening  Completed   Zoster Vaccines- Shingrix  Completed   HPV VACCINES  Aged Out   Meningococcal B Vaccine  Aged Out    Health Maintenance  Health Maintenance Due  Topic Date Due   INFLUENZA VACCINE  03/02/2024   COVID-19 Vaccine (7 - 2025-26 season) 04/02/2024    Colorectal cancer screening: Type of screening: Colonoscopy. Completed 2024. Repeat every   years  Lung Cancer Screening: (Low Dose CT Chest recommended if Age 83-80 years, 20 pack-year currently smoking OR have quit w/in 15years.) does not qualify.   Lung Cancer Screening Referral:   Additional Screening:  Hepatitis C Screening: does not qualify; Completed 2017  Vision Screening: Recommended annual ophthalmology exams for early detection of glaucoma and other disorders of the eye. Is the patient up to date with their annual eye exam?  Yes  Who is the provider or what is the name of the office in which the patient attends annual eye exams? Education  provided If pt is not established with a provider, would they like to be referred to a provider to establish care? No .   Dental Screening: Recommended annual dental exams for proper oral hygiene   Community Resource Referral / Chronic Care Management: CRR required this visit?  No   CCM required this visit?  No     Plan:     I have personally reviewed and noted the following in the patient's chart:   Medical and social history Use of alcohol, tobacco or illicit drugs  Current medications and  supplements including opioid prescriptions. Patient is not currently taking opioid prescriptions. Functional ability and status Nutritional status Physical activity Advanced directives List of other physicians Hospitalizations, surgeries, and ER visits in previous 12 months Vitals Screenings to include cognitive, depression, and falls Referrals and appointments  In addition, I have reviewed and discussed with patient certain preventive protocols, quality metrics, and best practice recommendations. A written personalized care plan for preventive services as well as general preventive health recommendations were provided to patient.     Mliss Graff, LPN   [id number]/01/7973   After Visit Summary: (MyChart) Due to this being a telephonic visit, the after visit summary with patients personalized plan was offered to patient via MyChart   Nurse Notes:

## 2024-04-03 NOTE — Patient Instructions (Signed)
 Daniel Davis , Thank you for taking time to come for your Medicare Wellness Visit. I appreciate your ongoing commitment to your health goals. Please review the following plan we discussed and let me know if I can assist you in the future.   Screening recommendations/referrals: Colonoscopy: up to date Recommended yearly ophthalmology/optometry visit for glaucoma screening and checkup Recommended yearly dental visit for hygiene and checkup  Vaccinations: Influenza vaccine: Education provided Pneumococcal vaccine: up to date Tdap vaccine: up to date Shingles vaccine: up to date    Advanced directives:  Education provided   Preventive Care 65 Years and Older, Male Preventive care refers to lifestyle choices and visits with your health care provider that can promote health and wellness. What does preventive care include? A yearly physical exam. This is also called an annual well check. Dental exams once or twice a year. Routine eye exams. Ask your health care provider how often you should have your eyes checked. Personal lifestyle choices, including: Daily care of your teeth and gums. Regular physical activity. Eating a healthy diet. Avoiding tobacco and drug use. Limiting alcohol use. Practicing safe sex. Taking low doses of aspirin every day. Taking vitamin and mineral supplements as recommended by your health care provider. What happens during an annual well check? The services and screenings done by your health care provider during your annual well check will depend on your age, overall health, lifestyle risk factors, and family history of disease. Counseling  Your health care provider may ask you questions about your: Alcohol use. Tobacco use. Drug use. Emotional well-being. Home and relationship well-being. Sexual activity. Eating habits. History of falls. Memory and ability to understand (cognition). Work and work Astronomer. Screening  You may have the following tests  or measurements: Height, weight, and BMI. Blood pressure. Lipid and cholesterol levels. These may be checked every 5 years, or more frequently if you are over 42 years old. Skin check. Lung cancer screening. You may have this screening every year starting at age 17 if you have a 30-pack-year history of smoking and currently smoke or have quit within the past 15 years. Fecal occult blood test (FOBT) of the stool. You may have this test every year starting at age 53. Flexible sigmoidoscopy or colonoscopy. You may have a sigmoidoscopy every 5 years or a colonoscopy every 10 years starting at age 50. Prostate cancer screening. Recommendations will vary depending on your family history and other risks. Hepatitis C blood test. Hepatitis B blood test. Sexually transmitted disease (STD) testing. Diabetes screening. This is done by checking your blood sugar (glucose) after you have not eaten for a while (fasting). You may have this done every 1-3 years. Abdominal aortic aneurysm (AAA) screening. You may need this if you are a current or former smoker. Osteoporosis. You may be screened starting at age 11 if you are at high risk. Talk with your health care provider about your test results, treatment options, and if necessary, the need for more tests. Vaccines  Your health care provider may recommend certain vaccines, such as: Influenza vaccine. This is recommended every year. Tetanus, diphtheria, and acellular pertussis (Tdap, Td) vaccine. You may need a Td booster every 10 years. Zoster vaccine. You may need this after age 63. Pneumococcal 13-valent conjugate (PCV13) vaccine. One dose is recommended after age 81. Pneumococcal polysaccharide (PPSV23) vaccine. One dose is recommended after age 79. Talk to your health care provider about which screenings and vaccines you need and how often you need them. This  information is not intended to replace advice given to you by your health care provider. Make  sure you discuss any questions you have with your health care provider. Document Released: 08/15/2015 Document Revised: 04/07/2016 Document Reviewed: 05/20/2015 Elsevier Interactive Patient Education  2017 [employer].  Fall Prevention in the Home Falls can cause injuries. They can happen to people of all ages. There are many things you can do to make your home safe and to help prevent falls. What can I do on the outside of my home? Regularly fix the edges of walkways and driveways and fix any cracks. Remove anything that might make you trip as you walk through a door, such as a raised step or threshold. Trim any bushes or trees on the path to your home. Use bright outdoor lighting. Clear any walking paths of anything that might make someone trip, such as rocks or tools. Regularly check to see if handrails are loose or broken. Make sure that both sides of any steps have handrails. Any raised decks and porches should have guardrails on the edges. Have any leaves, snow, or ice cleared regularly. Use sand or salt on walking paths during winter. Clean up any spills in your garage right away. This includes oil or grease spills. What can I do in the bathroom? Use night lights. Install grab bars by the toilet and in the tub and shower. Do not use towel bars as grab bars. Use non-skid mats or decals in the tub or shower. If you need to sit down in the shower, use a plastic, non-slip stool. Keep the floor dry. Clean up any water  that spills on the floor as soon as it happens. Remove soap buildup in the tub or shower regularly. Attach bath mats securely with double-sided non-slip rug tape. Do not have throw rugs and other things on the floor that can make you trip. What can I do in the bedroom? Use night lights. Make sure that you have a light by your bed that is easy to reach. Do not use any sheets or blankets that are too big for your bed. They should not hang down onto the floor. Have a firm  chair that has side arms. You can use this for support while you get dressed. Do not have throw rugs and other things on the floor that can make you trip. What can I do in the kitchen? Clean up any spills right away. Avoid walking on wet floors. Keep items that you use a lot in easy-to-reach places. If you need to reach something above you, use a strong step stool that has a grab bar. Keep electrical cords out of the way. Do not use floor polish or wax that makes floors slippery. If you must use wax, use non-skid floor wax. Do not have throw rugs and other things on the floor that can make you trip. What can I do with my stairs? Do not leave any items on the stairs. Make sure that there are handrails on both sides of the stairs and use them. Fix handrails that are broken or loose. Make sure that handrails are as long as the stairways. Check any carpeting to make sure that it is firmly attached to the stairs. Fix any carpet that is loose or worn. Avoid having throw rugs at the top or bottom of the stairs. If you do have throw rugs, attach them to the floor with carpet tape. Make sure that you have a light switch at the top  of the stairs and the bottom of the stairs. If you do not have them, ask someone to add them for you. What else can I do to help prevent falls? Wear shoes that: Do not have high heels. Have rubber bottoms. Are comfortable and fit you well. Are closed at the toe. Do not wear sandals. If you use a stepladder: Make sure that it is fully opened. Do not climb a closed stepladder. Make sure that both sides of the stepladder are locked into place. Ask someone to hold it for you, if possible. Clearly mark and make sure that you can see: Any grab bars or handrails. First and last steps. Where the edge of each step is. Use tools that help you move around (mobility aids) if they are needed. These include: Canes. Walkers. Scooters. Crutches. Turn on the lights when you go  into a dark area. Replace any light bulbs as soon as they burn out. Set up your furniture so you have a clear path. Avoid moving your furniture around. If any of your floors are uneven, fix them. If there are any pets around you, be aware of where they are. Review your medicines with your doctor. Some medicines can make you feel dizzy. This can increase your chance of falling. Ask your doctor what other things that you can do to help prevent falls. This information is not intended to replace advice given to you by your health care provider. Make sure you discuss any questions you have with your health care provider. Document Released: 05/15/2009 Document Revised: 12/25/2015 Document Reviewed: 08/23/2014 Elsevier Interactive Patient Education  2017 [employer].

## 2024-04-25 ENCOUNTER — Ambulatory Visit (INDEPENDENT_AMBULATORY_CARE_PROVIDER_SITE_OTHER): Admitting: Family Medicine

## 2024-04-25 VITALS — BP 130/76 | HR 85 | Resp 18 | Ht 70.5 in | Wt 187.0 lb

## 2024-04-25 DIAGNOSIS — R7303 Prediabetes: Secondary | ICD-10-CM | POA: Diagnosis not present

## 2024-04-25 DIAGNOSIS — I10 Essential (primary) hypertension: Secondary | ICD-10-CM | POA: Diagnosis not present

## 2024-04-25 DIAGNOSIS — M109 Gout, unspecified: Secondary | ICD-10-CM | POA: Diagnosis not present

## 2024-04-25 DIAGNOSIS — E785 Hyperlipidemia, unspecified: Secondary | ICD-10-CM

## 2024-04-25 DIAGNOSIS — Z23 Encounter for immunization: Secondary | ICD-10-CM | POA: Diagnosis not present

## 2024-04-25 LAB — COMPREHENSIVE METABOLIC PANEL WITH GFR
ALT: 18 U/L (ref 0–53)
AST: 34 U/L (ref 0–37)
Albumin: 4.6 g/dL (ref 3.5–5.2)
Alkaline Phosphatase: 54 U/L (ref 39–117)
BUN: 13 mg/dL (ref 6–23)
CO2: 26 meq/L (ref 19–32)
Calcium: 9.4 mg/dL (ref 8.4–10.5)
Chloride: 98 meq/L (ref 96–112)
Creatinine, Ser: 0.87 mg/dL (ref 0.40–1.50)
GFR: 89.98 mL/min (ref >60.00)
Glucose, Bld: 96 mg/dL (ref 70–99)
Potassium: 4.2 meq/L (ref 3.5–5.1)
Sodium: 136 meq/L (ref 135–145)
Total Bilirubin: 0.8 mg/dL (ref 0.2–1.2)
Total Protein: 6.9 g/dL (ref 6.0–8.3)

## 2024-04-25 LAB — LIPID PANEL
Cholesterol: 114 mg/dL (ref 0–200)
HDL: 70.7 mg/dL (ref >39.00)
LDL Cholesterol: 21 mg/dL (ref 0–99)
NonHDL: 42.89
Total CHOL/HDL Ratio: 2
Triglycerides: 322 mg/dL — ABNORMAL HIGH (ref 0.0–149.0)
VLDL: 64.4 mg/dL — ABNORMAL HIGH (ref 0.0–40.0)

## 2024-04-25 LAB — HEMOGLOBIN A1C: Hgb A1c MFr Bld: 6.1 % (ref 4.6–6.5)

## 2024-04-25 LAB — URIC ACID: Uric Acid, Serum: 4.2 mg/dL (ref 4.0–7.8)

## 2024-04-25 MED ORDER — AMLODIPINE BESYLATE 5 MG PO TABS: 5.0000 mg | ORAL_TABLET | Freq: Every day | ORAL | 2 refills | Status: AC

## 2024-04-25 MED ORDER — LISINOPRIL 20 MG PO TABS
20.0000 mg | ORAL_TABLET | Freq: Every day | ORAL | 2 refills | Status: AC
Start: 2024-04-25 — End: ?

## 2024-04-25 MED ORDER — ALLOPURINOL 100 MG PO TABS: 100.0000 mg | ORAL_TABLET | Freq: Every day | ORAL | 2 refills | Status: AC

## 2024-04-25 MED ORDER — ATORVASTATIN CALCIUM 20 MG PO TABS: ORAL_TABLET | ORAL | 2 refills | Status: AC

## 2024-04-25 NOTE — Patient Instructions (Signed)
 Thank you for coming in today. No change in medications at this time. If there are any concerns on your bloodwork, I will let you know. Take care!

## 2024-04-25 NOTE — Progress Notes (Unsigned)
 Subjective:  Patient ID: Daniel Davis., male    DOB: Mar 24, 1956  Age: 68 y.o. MRN: [id number]  CC:  Chief Complaint  Patient presents with   Medical Management of Chronic Issues    HPI Daniel Davis. presents for follow up, no health changes.   Hypertension: Amlodipine  5 mg daily, lisinopril  20 mg daily, denies new med side effects. No missed doses.  Home readings: rare.  BP Readings from Last 3 Encounters:  04/25/24 130/76  10/24/23 128/70  10/24/23 128/68   Lab Results  Component Value Date   CREATININE 0.84 10/24/2023    Hyperlipidemia: Lipitor 20 mg daily without new myalgias/side effects. Lab Results  Component Value Date   CHOL 113 10/24/2023   HDL 68.90 10/24/2023   LDLCALC 18 10/24/2023   LDLDIRECT 35.0 03/21/2023   TRIG 126.0 10/24/2023   CHOLHDL 2 10/24/2023   Lab Results  Component Value Date   ALT 34 11/03/2023   AST 52 (H) 11/03/2023   ALKPHOS 64 11/03/2023   BILITOT 0.4 11/03/2023   Gout: Last flare: None recent Daily meds: Allopurinol  100 mg daily Prn med: Indomethacin  if needed. Lab Results  Component Value Date   LABURIC 5.6 03/26/2020    Prediabetes: Diet/exercise approach, no current meds.  Weight has continued to improve with 2 pounds decrease from March.  He has avoided sugary beverages, Daily walking to help with exercise.  Lab Results  Component Value Date   HGBA1C 6.1 10/24/2023   Wt Readings from Last 3 Encounters:  04/25/24 187 lb (84.8 kg)  04/03/24 192 lb (87.1 kg)  10/24/23 189 lb 9.6 oz (86 kg)   HM: Flu vaccine given today.  Covid booster - discussed, he plans on at pharmacy.  PSA undetectable after radiation for prostate CA with urology visit in April - now yearly PSA monitoring.   History Patient Active Problem List   Diagnosis Date Noted   Hyperlipidemia 09/16/2022   Prediabetes 09/16/2022   Prostate cancer (HCC) 01/08/2022   Nonspecific abnormal finding in stool contents 02/28/2015   Family history  of cancer 02/28/2015   OA (osteoarthritis) 02/28/2012   Essential hypertension 11/27/2011   Lipid disorder 11/27/2011   Gout of multiple sites 11/27/2011   Past Medical History:  Diagnosis Date   Arthritis    Cancer (HCC) 12/2021   prostate   Colon polyps    Gout attack    Hyperlipidemia    Hypertension    Past Surgical History:  Procedure Laterality Date   COLONOSCOPY  03/2015   FRACTURE SURGERY  08/02/1998   LEFT ankle, ORIF   LYMPH NODE DISSECTION Bilateral 01/08/2022   Procedure: LYMPH NODE DISSECTION;  Surgeon: Alvaro Hummer, MD;  Location: [facility];  Service: Urology;  Laterality: Bilateral;   ROBOT ASSISTED LAPAROSCOPIC RADICAL PROSTATECTOMY N/A 01/08/2022   Procedure: XI ROBOTIC ASSISTED LAPAROSCOPIC RADICAL PROSTATECTOMY AND INDOCYANINE GREEN DYE;  Surgeon: Alvaro Hummer, MD;  Location: [facility];  Service: Urology;  Laterality: N/A;   WISDOM TOOTH EXTRACTION     Allergies  Allergen Reactions   Gadolinium Derivatives Hives    11/18/17 developed 4-5 hives on neck and face; no other complaints.  Treated with Benadryl  25mg  PO (pt given 2 more caps to take with him for 3.5 mile drive home).  Needs Benadryl  50mg  PO pre-med prior to next MRI w/ gad, per Dr. Oneil Officer.   Methocarbamol Hives   Prior to Admission medications   Medication Sig Start Date End Date Taking? Authorizing Provider  allopurinol  (ZYLOPRIM ) 100 MG tablet Take 1 tablet (100 mg total) by mouth daily. 10/24/23  Yes Levora Reyes SAUNDERS, MD  amLODipine  (NORVASC ) 5 MG tablet Take 1 tablet (5 mg total) by mouth daily. 10/24/23  Yes Levora Reyes SAUNDERS, MD  aspirin EC 81 MG tablet Take 81 mg by mouth daily. Swallow whole.   Yes [provider]  atorvastatin  (LIPITOR) 20 MG tablet take 1 tablet by mouth at bedtime 10/24/23  Yes Levora Reyes SAUNDERS, MD  docusate sodium  (COLACE) 100 MG capsule Take 1 capsule (100 mg total) by mouth 2 (two) times daily. 01/08/22  Yes Dancy, Alan, PA-C  ferrous sulfate 325 (65 FE) MG  tablet Take 325 mg by mouth daily with breakfast.   Yes [provider]  indomethacin  (INDOCIN ) 50 MG capsule TAKE 1 CAPSULE(50 MG) BY MOUTH THREE TIMES DAILY AS NEEDED FOR MILD PAIN OR GOUT 09/13/23  Yes Levora Reyes SAUNDERS, MD  lisinopril  (ZESTRIL ) 20 MG tablet TAKE 1 TABLET(20 MG) BY MOUTH DAILY 01/10/24  Yes Levora Reyes SAUNDERS, MD   Social History   Socioeconomic History   Marital status: Divorced    Spouse name: Jinnie   Number of children: 1   Years of education: 14+   Highest education level: Not on file  Occupational History   Occupation: SHIFT SUPERVISOR    Employer: [employer]  Tobacco Use   Smoking status: Never   Smokeless tobacco: Never  Vaping Use   Vaping status: Never Used  Substance and Sexual Activity   Alcohol use: Yes    Alcohol/week: 3.0 standard drinks of alcohol    Types: 3 Standard drinks or equivalent per week    Comment: 3-5 drinks   Drug use: No   Sexual activity: Yes    Partners: Female  Other Topics Concern   Not on file  Social History Narrative   Lives with his wife and their daughter.   Exercise: Walking   Education: some Automotive engineer   Social Drivers of Health   Financial Resource Strain: Low Risk  (04/03/2024)   Overall Financial Resource Strain (CARDIA)    Difficulty of Paying Living Expenses: Not hard at all  Food Insecurity: No Food Insecurity (04/03/2024)   Hunger Vital Sign    Worried About Running Out of Food in the Last Year: Never true    Ran Out of Food in the Last Year: Never true  Transportation Needs: No Transportation Needs (04/03/2024)   PRAPARE - Administrator, Civil Service (Medical): No    Lack of Transportation (Non-Medical): No  Physical Activity: Insufficiently Active (04/03/2024)   Exercise Vital Sign    Days of Exercise per Week: 3 days    Minutes of Exercise per Session: 30 min  Stress: No Stress Concern Present (04/03/2024)   [employer] of Occupational Health - Occupational Stress  Questionnaire    Feeling of Stress: Not at all  Social Connections: Moderately Integrated (04/03/2024)   Social Connection and Isolation Panel    Frequency of Communication with Friends and Family: More than three times a week    Frequency of Social Gatherings with Friends and Family: Three times a week    Attends Religious Services: More than 4 times per year    Active Member of Clubs or Organizations: Yes    Attends Banker Meetings: More than 4 times per year    Marital Status: Divorced  Intimate Partner Violence: Not At Risk (04/03/2024)   Humiliation, Afraid, Rape, and Kick  questionnaire    Fear of Current or Ex-Partner: No    Emotionally Abused: No    Physically Abused: No    Sexually Abused: No    Review of Systems  Constitutional:  Negative for fatigue and unexpected weight change.  Eyes:  Negative for visual disturbance.  Respiratory:  Negative for cough, chest tightness and shortness of breath.   Cardiovascular:  Negative for chest pain, palpitations and leg swelling.  Gastrointestinal:  Negative for abdominal pain and blood in stool.  Neurological:  Negative for dizziness, light-headedness and headaches.     Objective:   Vitals:   04/25/24 0834 04/25/24 0915  BP: (!) 148/78 130/76  Pulse: 85   Resp: 18   SpO2: 95%   Weight: 187 lb (84.8 kg)   Height: 5' 10.5 (1.791 m)      Physical Exam Vitals reviewed.  Constitutional:      Appearance: He is well-developed.  HENT:     Head: Normocephalic and atraumatic.  Neck:     Vascular: No carotid bruit or JVD.  Cardiovascular:     Rate and Rhythm: Normal rate and regular rhythm.     Heart sounds: Normal heart sounds. No murmur heard. Pulmonary:     Effort: Pulmonary effort is normal.     Breath sounds: Normal breath sounds. No rales.  Musculoskeletal:     Right lower leg: No edema.     Left lower leg: No edema.  Skin:    General: Skin is warm and dry.  Neurological:     Mental Status: He is alert  and oriented to person, place, and time.  Psychiatric:        Mood and Affect: Mood normal.        Assessment & Plan:  Ashlin Hidalgo. is a 68 y.o. male . Prediabetes - Plan: Hemoglobin A1c  Gout of multiple sites, unspecified cause, unspecified chronicity - Plan: allopurinol  (ZYLOPRIM ) 100 MG tablet, Uric acid  Essential hypertension - Plan: amLODipine  (NORVASC ) 5 MG tablet, lisinopril  (ZESTRIL ) 20 MG tablet, Comprehensive metabolic panel with GFR  Hyperlipidemia, unspecified hyperlipidemia type - Plan: atorvastatin  (LIPITOR) 20 MG tablet, Comprehensive metabolic panel with GFR, Lipid panel   Meds ordered this encounter  Medications   allopurinol  (ZYLOPRIM ) 100 MG tablet    Sig: Take 1 tablet (100 mg total) by mouth daily.    Dispense:  90 tablet    Refill:  2   amLODipine  (NORVASC ) 5 MG tablet    Sig: Take 1 tablet (5 mg total) by mouth daily.    Dispense:  90 tablet    Refill:  2   atorvastatin  (LIPITOR) 20 MG tablet    Sig: take 1 tablet by mouth at bedtime    Dispense:  90 tablet    Refill:  2   lisinopril  (ZESTRIL ) 20 MG tablet    Sig: Take 1 tablet (20 mg total) by mouth daily.    Dispense:  90 tablet    Refill:  2   Patient Instructions  Thank you for coming in today. No change in medications at this time. If there are any concerns on your bloodwork, I will let you know. Take care!     Signed,   Reyes Pines, MD [facility], [facility] 04/25/24 9:17 AM

## 2024-04-26 ENCOUNTER — Encounter: Payer: Self-pay | Admitting: Family Medicine

## 2024-04-29 ENCOUNTER — Ambulatory Visit: Payer: Self-pay | Admitting: Family Medicine

## 2024-05-07 NOTE — Progress Notes (Signed)
 Lab results have been discussed.   Verbalized understanding? Yes  Are there any questions? No

## 2024-07-12 ENCOUNTER — Other Ambulatory Visit: Payer: Self-pay | Admitting: Family Medicine

## 2024-07-12 DIAGNOSIS — M109 Gout, unspecified: Secondary | ICD-10-CM

## 2024-10-24 ENCOUNTER — Ambulatory Visit: Admitting: Family Medicine

## 2025-04-09 ENCOUNTER — Encounter
